# Patient Record
Sex: Male | Born: 1945 | Race: Black or African American | Hispanic: No | Marital: Married | Smoking: Never smoker
Health system: Southern US, Community
[De-identification: ages and names within clinical notes are randomized; demographics above are authoritative.]

## PROBLEM LIST (undated history)

## (undated) DIAGNOSIS — R011 Cardiac murmur, unspecified: Secondary | ICD-10-CM

## (undated) DIAGNOSIS — K219 Gastro-esophageal reflux disease without esophagitis: Secondary | ICD-10-CM

## (undated) DIAGNOSIS — J189 Pneumonia, unspecified organism: Secondary | ICD-10-CM

## (undated) DIAGNOSIS — Z923 Personal history of irradiation: Secondary | ICD-10-CM

## (undated) DIAGNOSIS — E785 Hyperlipidemia, unspecified: Secondary | ICD-10-CM

## (undated) DIAGNOSIS — C61 Malignant neoplasm of prostate: Secondary | ICD-10-CM

## (undated) DIAGNOSIS — M109 Gout, unspecified: Secondary | ICD-10-CM

## (undated) DIAGNOSIS — K4090 Unilateral inguinal hernia, without obstruction or gangrene, not specified as recurrent: Secondary | ICD-10-CM

## (undated) DIAGNOSIS — E119 Type 2 diabetes mellitus without complications: Secondary | ICD-10-CM

## (undated) DIAGNOSIS — I255 Ischemic cardiomyopathy: Secondary | ICD-10-CM

## (undated) DIAGNOSIS — M1990 Unspecified osteoarthritis, unspecified site: Secondary | ICD-10-CM

## (undated) DIAGNOSIS — I2510 Atherosclerotic heart disease of native coronary artery without angina pectoris: Secondary | ICD-10-CM

## (undated) DIAGNOSIS — IMO0002 Reserved for concepts with insufficient information to code with codable children: Secondary | ICD-10-CM

## (undated) DIAGNOSIS — T8859XA Other complications of anesthesia, initial encounter: Secondary | ICD-10-CM

## (undated) DIAGNOSIS — I10 Essential (primary) hypertension: Secondary | ICD-10-CM

## (undated) DIAGNOSIS — F32A Depression, unspecified: Secondary | ICD-10-CM

## (undated) DIAGNOSIS — F329 Major depressive disorder, single episode, unspecified: Secondary | ICD-10-CM

## (undated) DIAGNOSIS — R569 Unspecified convulsions: Secondary | ICD-10-CM

## (undated) DIAGNOSIS — F419 Anxiety disorder, unspecified: Secondary | ICD-10-CM

## (undated) HISTORY — DX: Personal history of irradiation: Z92.3

## (undated) HISTORY — DX: Unspecified osteoarthritis, unspecified site: M19.90

## (undated) HISTORY — DX: Ischemic cardiomyopathy: I25.5

## (undated) HISTORY — PX: INGUINAL HERNIA REPAIR: SUR1180

## (undated) HISTORY — DX: Unspecified convulsions: R56.9

## (undated) HISTORY — DX: Essential (primary) hypertension: I10

## (undated) HISTORY — DX: Atherosclerotic heart disease of native coronary artery without angina pectoris: I25.10

## (undated) HISTORY — PX: HAND SURGERY: SHX662

## (undated) HISTORY — DX: Unilateral inguinal hernia, without obstruction or gangrene, not specified as recurrent: K40.90

## (undated) HISTORY — DX: Cardiac murmur, unspecified: R01.1

## (undated) HISTORY — DX: Reserved for concepts with insufficient information to code with codable children: IMO0002

## (undated) HISTORY — DX: Type 2 diabetes mellitus without complications: E11.9

## (undated) HISTORY — DX: Hyperlipidemia, unspecified: E78.5

---

## 2000-01-28 ENCOUNTER — Ambulatory Visit (HOSPITAL_COMMUNITY): Admission: RE | Admit: 2000-01-28 | Discharge: 2000-01-28 | Payer: Self-pay | Admitting: Internal Medicine

## 2000-01-28 ENCOUNTER — Encounter: Payer: Self-pay | Admitting: Internal Medicine

## 2003-02-26 ENCOUNTER — Ambulatory Visit (HOSPITAL_COMMUNITY): Admission: RE | Admit: 2003-02-26 | Discharge: 2003-02-26 | Payer: Self-pay | Admitting: Internal Medicine

## 2003-02-26 ENCOUNTER — Encounter: Payer: Self-pay | Admitting: Internal Medicine

## 2003-10-31 ENCOUNTER — Ambulatory Visit (HOSPITAL_COMMUNITY): Admission: RE | Admit: 2003-10-31 | Discharge: 2003-10-31 | Payer: Self-pay | Admitting: Gastroenterology

## 2004-01-27 ENCOUNTER — Ambulatory Visit (HOSPITAL_COMMUNITY): Admission: RE | Admit: 2004-01-27 | Discharge: 2004-01-27 | Payer: Self-pay | Admitting: Internal Medicine

## 2004-03-12 ENCOUNTER — Ambulatory Visit (HOSPITAL_COMMUNITY): Admission: RE | Admit: 2004-03-12 | Discharge: 2004-03-12 | Payer: Self-pay

## 2004-03-18 ENCOUNTER — Encounter: Admission: RE | Admit: 2004-03-18 | Discharge: 2004-03-28 | Payer: Self-pay | Admitting: Neurology

## 2005-02-04 ENCOUNTER — Inpatient Hospital Stay (HOSPITAL_COMMUNITY): Admission: EM | Admit: 2005-02-04 | Discharge: 2005-02-06 | Payer: Self-pay | Admitting: Emergency Medicine

## 2005-02-04 ENCOUNTER — Ambulatory Visit: Payer: Self-pay | Admitting: Cardiology

## 2005-02-23 ENCOUNTER — Ambulatory Visit: Payer: Self-pay | Admitting: Internal Medicine

## 2005-04-01 ENCOUNTER — Ambulatory Visit: Payer: Self-pay | Admitting: Cardiology

## 2005-07-22 ENCOUNTER — Ambulatory Visit: Payer: Self-pay | Admitting: Cardiology

## 2006-01-07 ENCOUNTER — Ambulatory Visit: Payer: Self-pay | Admitting: Cardiology

## 2006-05-25 ENCOUNTER — Ambulatory Visit: Payer: Self-pay | Admitting: Cardiology

## 2006-11-10 ENCOUNTER — Ambulatory Visit: Payer: Self-pay | Admitting: Cardiology

## 2007-06-23 ENCOUNTER — Ambulatory Visit: Payer: Self-pay | Admitting: Cardiology

## 2007-09-20 ENCOUNTER — Ambulatory Visit: Payer: Self-pay

## 2007-09-20 LAB — CONVERTED CEMR LAB
Albumin: 3.9 g/dL (ref 3.5–5.2)
Alkaline Phosphatase: 53 units/L (ref 39–117)
Total Bilirubin: 0.8 mg/dL (ref 0.3–1.2)

## 2007-12-26 ENCOUNTER — Ambulatory Visit: Payer: Self-pay | Admitting: Cardiology

## 2007-12-28 ENCOUNTER — Ambulatory Visit: Payer: Self-pay | Admitting: Cardiology

## 2007-12-28 LAB — CONVERTED CEMR LAB
Cholesterol: 215 mg/dL (ref 0–200)
Direct LDL: 148.4 mg/dL
Total CHOL/HDL Ratio: 4.8
Triglycerides: 90 mg/dL (ref 0–149)
VLDL: 18 mg/dL (ref 0–40)

## 2008-03-01 ENCOUNTER — Ambulatory Visit: Payer: Self-pay | Admitting: Cardiology

## 2008-03-01 LAB — CONVERTED CEMR LAB
ALT: 20 units/L (ref 0–53)
Cholesterol: 171 mg/dL (ref 0–200)
HDL: 47.6 mg/dL (ref >39.0)
Total Protein: 6.7 g/dL (ref 6.0–8.3)
VLDL: 18 mg/dL (ref 0–40)

## 2008-09-03 ENCOUNTER — Ambulatory Visit: Payer: Self-pay | Admitting: Cardiovascular Disease

## 2008-09-05 ENCOUNTER — Ambulatory Visit: Payer: Self-pay

## 2008-09-05 ENCOUNTER — Ambulatory Visit: Payer: Self-pay | Admitting: Cardiovascular Disease

## 2008-09-05 LAB — CONVERTED CEMR LAB
ALT: 18 units/L (ref 0–53)
AST: 19 units/L (ref 0–37)
Albumin: 4 g/dL (ref 3.5–5.2)
BUN: 13 mg/dL (ref 6–23)
CO2: 30 meq/L (ref 19–32)
Chloride: 105 meq/L (ref 96–112)
Cholesterol: 149 mg/dL (ref 0–200)
Creatinine, Ser: 0.8 mg/dL (ref 0.4–1.5)
Total Bilirubin: 0.7 mg/dL (ref 0.3–1.2)
Total CK: 90 units/L (ref 7–195)
Triglycerides: 97 mg/dL (ref 0–149)

## 2009-02-15 ENCOUNTER — Telehealth: Payer: Self-pay | Admitting: Cardiovascular Disease

## 2009-03-18 ENCOUNTER — Ambulatory Visit: Payer: Self-pay | Admitting: Cardiovascular Disease

## 2009-03-18 DIAGNOSIS — E7800 Pure hypercholesterolemia, unspecified: Secondary | ICD-10-CM

## 2009-03-21 ENCOUNTER — Ambulatory Visit: Payer: Self-pay | Admitting: Cardiovascular Disease

## 2009-03-21 DIAGNOSIS — I2510 Atherosclerotic heart disease of native coronary artery without angina pectoris: Secondary | ICD-10-CM

## 2009-03-21 DIAGNOSIS — I10 Essential (primary) hypertension: Secondary | ICD-10-CM | POA: Insufficient documentation

## 2009-03-21 LAB — CONVERTED CEMR LAB
ALT: 15 units/L (ref 0–53)
Bilirubin, Direct: 0 mg/dL (ref 0.0–0.3)
Total Bilirubin: 0.6 mg/dL (ref 0.3–1.2)
Total CHOL/HDL Ratio: 3
VLDL: 10.4 mg/dL (ref 0.0–40.0)

## 2009-09-24 ENCOUNTER — Ambulatory Visit: Payer: Self-pay | Admitting: Cardiovascular Disease

## 2010-02-12 ENCOUNTER — Emergency Department (HOSPITAL_COMMUNITY): Admission: EM | Admit: 2010-02-12 | Discharge: 2010-02-12 | Payer: Self-pay | Admitting: Emergency Medicine

## 2010-09-30 NOTE — Assessment & Plan Note (Signed)
Summary: F6M/ANAS   Visit Type:  6 mo f/u Primary Provider:  Margaretmary Bayley, MD  CC:  pt had some weakness and sob last month along with joint pain..pt was on simvastatin and decided to d/c simvastatin and says sxms have gotten better and still has some hand pain but not like last month..denies any other cardiac complaints today.  History of Present Illness: Seth Guzman is a pleasant 65 year old African American male with past medical history significant for diabetes mellitus, hypertension, hyperlipidemia, and coronary artery disease, who is here today for routine cardiac follow up.   Seth Guzman most recent heart catheterization was in 2006 and showed branch vessel disease as well as moderate nonobstructive disease in the major epicardial vessels.  He was noted to have a 40-50% stenosis in the distal LAD as well as 20-30% lesions in the mid vessel. There was a large ramus intermedius branch that supplied much of the lateral wall and was free of significant disease.  The right coronary artery had minor luminal irregularities noted.  There was an 80% stenosis in the ostium of a bifurcating second diagonal branch.  There was a large RV marginal branch that had some disease.   He tells me that he has been doing well. He has had no chest pain. He is retired. He works around his house and yard and has had no problems. He has had no SOB, palpitations, dizziness, near syncope or syncope. He does get tired after exerting himself but this is unchanged. He had some left arm pain several months ago and thought it might be his Zocor so he stopped taking it. The pain was over the medial aspect of the left forearm and was a constant aching pain. It resolved after one week. He had no pain in the large muscles of the arms or legs.     Current Medications (verified): 1)  Actos 15 Mg Tabs (Pioglitazone Hcl) .Marland Kitchen.. 1 Tab Once Daily 2)  Glipizide-Metformin Hcl 5-500 Mg Tabs (Glipizide-Metformin Hcl) .Marland Kitchen.. 1 Tab Two Times A  Day 3)  Pantoprazole Sodium 40 Mg Tbec (Pantoprazole Sodium) .Marland Kitchen.. 1 Tab Once Daily 4)  Exforge 5-320 Mg Tabs (Amlodipine Besylate-Valsartan) .Marland Kitchen.. 1 Tab Once Daily 5)  Fish Oil 1000 Mg Caps (Omega-3 Fatty Acids) .Marland Kitchen.. 1 Cap Once Daily 6)  Glucosamine Sulfate 1000 Mg Tabs (Glucosamine Sulfate) .Marland Kitchen.. 1 Tab Once Daily 7)  Multivitamins   Tabs (Multiple Vitamin) .Marland Kitchen.. 1 Tab Once Daily 8)  Carvedilol 3.125 Mg Tabs (Carvedilol) .... Take One Tablet By Mouth Twice A Day 9)  Nitroglycerin 0.4 Mg Subl (Nitroglycerin) .... One Tablet Under Tongue Every 5 Minutes As Needed For Chest Pain---May Repeat Times Three  Allergies (verified): No Known Drug Allergies  Past History:  Past Medical History: Reviewed history from 09/18/2008 and no changes required. 1. Hypertension.   2. Hyperlipidemia.   3. Diabetes mellitus.   4. Coronary artery disease status post cath in 2006 that showed       moderate nonobstructive disease.  5. Erectile dysfunction 6. Osteoarthritis 7. GERD 8. Anxiety/Depression  Social History: Reviewed history from 09/18/2008 and no changes required. The patient denies use of tobacco, but does admit to   occasional alcohol use.  He denies use of illicit drugs.  He is retired   from [employer] .  He is married.      Review of Systems       The patient complains of fatigue.  The patient denies malaise, fever, weight gain/loss, vision loss,  decreased hearing, hoarseness, chest pain, palpitations, shortness of breath, prolonged cough, wheezing, sleep apnea, coughing up blood, abdominal pain, blood in stool, nausea, vomiting, diarrhea, heartburn, incontinence, blood in urine, muscle weakness, joint pain, leg swelling, rash, skin lesions, headache, fainting, dizziness, depression, anxiety, enlarged lymph nodes, easy bruising or bleeding, and environmental allergies.    Vital Signs:  Patient profile:   66 year old male Height:      71.5 inches Weight:      185 pounds BMI:      25.53 Pulse rate:   68 / minute Pulse rhythm:   regular BP sitting:   122 / 70  (left arm) Cuff size:   large  Vitals Entered By: Danielle Rankin, CMA (September 24, 2009 9:58 AM)  Physical Exam  General:  General: Well developed, well nourished, NAD HEENT: OP clear, mucus membranes moist Psychiatric: Mood and affect normal Neck: No JVD, no carotid bruits, no thyromegaly, no lymphadenopathy. Lungs:Clear bilaterally, no wheezes, rhonci, crackles CV: RRR no murmurs, gallops rubs Abdomen: soft, NT, ND, BS present Extremities: No edema, pulses 2+.    Impression & Recommendations:  Problem # 1:  CORONARY ATHEROSCLEROSIS, NATIVE VESSEL (ICD-414.01) Stable. Continue ASA, beta blocker. He will restart the Zocor at 40 mg by mouth at night. He is urged to call us if he has any joint or limb pain that he thinks may be related to this and we will advise.   His updated medication list for this problem includes:    Exforge 5-320 Mg Tabs (Amlodipine besylate-valsartan) .Marland Kitchen... 1 tab once daily    Carvedilol 3.125 Mg Tabs (Carvedilol) .Marland Kitchen... Take one tablet by mouth twice a day    Nitroglycerin 0.4 Mg Subl (Nitroglycerin) ..... One tablet under tongue every 5 minutes as needed for chest pain---may repeat times three    Aspirin 81 Mg Tbec (Aspirin) .Marland Kitchen... Take one tablet by mouth daily  Problem # 2:  HYPERTENSION, BENIGN (ICD-401.1) Well controlled.   His updated medication list for this problem includes:    Exforge 5-320 Mg Tabs (Amlodipine besylate-valsartan) .Marland Kitchen... 1 tab once daily    Carvedilol 3.125 Mg Tabs (Carvedilol) .Marland Kitchen... Take one tablet by mouth twice a day    Aspirin 81 Mg Tbec (Aspirin) .Marland Kitchen... Take one tablet by mouth daily  Problem # 3:  PURE HYPERCHOLESTEROLEMIA (ICD-272.0) Restart Zocor as above. LFTs have been normal in the past. Will need repeat lipids at follow up.   The following medications were removed from the medication list:    Simvastatin 40 Mg Tabs (Simvastatin) .Marland Kitchen... Take 1  tablet by mouth at bedtime His updated medication list for this problem includes:    Simvastatin 40 Mg Tabs (Simvastatin) .Marland Kitchen... Take one tablet by mouth daily at bedtime  Patient Instructions: 1)  Your physician recommends that you schedule a follow-up appointment in: 12 months 2)  Your physician has recommended you make the following change in your medication: Resume simvastatin 40 mg by mouth daily at bedtime

## 2010-11-16 LAB — URINALYSIS, ROUTINE W REFLEX MICROSCOPIC
Glucose, UA: NEGATIVE mg/dL
Protein, ur: 100 mg/dL — AB
pH: 6 (ref 5.0–8.0)

## 2010-11-16 LAB — URINE MICROSCOPIC-ADD ON

## 2010-11-16 LAB — PROTIME-INR: INR: 1.04 (ref 0.00–1.49)

## 2010-11-16 LAB — CBC
MCHC: 33.4 g/dL (ref 30.0–36.0)
RBC: 4.84 MIL/uL (ref 4.22–5.81)
WBC: 16.1 10*3/uL — ABNORMAL HIGH (ref 4.0–10.5)

## 2010-11-16 LAB — URINE CULTURE: Colony Count: >=100000

## 2010-11-16 LAB — DIFFERENTIAL
Basophils Absolute: 0 10*3/uL (ref 0.0–0.1)
Basophils Relative: 0 % (ref 0–1)
Lymphocytes Relative: 8 % — ABNORMAL LOW (ref 12–46)
Monocytes Relative: 6 % (ref 3–12)
Neutro Abs: 13.8 10*3/uL — ABNORMAL HIGH (ref 1.7–7.7)
Neutrophils Relative %: 85 % — ABNORMAL HIGH (ref 43–77)

## 2010-11-16 LAB — BASIC METABOLIC PANEL
CO2: 28 mEq/L (ref 19–32)
Calcium: 9.6 mg/dL (ref 8.4–10.5)
Creatinine, Ser: 0.89 mg/dL (ref 0.4–1.5)
GFR calc Af Amer: >60 mL/min (ref >60)
GFR calc non Af Amer: >60 mL/min (ref >60)

## 2011-01-01 ENCOUNTER — Ambulatory Visit (HOSPITAL_COMMUNITY)
Admission: RE | Admit: 2011-01-01 | Discharge: 2011-01-01 | Disposition: A | Discharge status: Home / Self Care | Payer: PRIVATE HEALTH INSURANCE | Source: Ambulatory Visit | Attending: Internal Medicine | Admitting: Internal Medicine

## 2011-01-01 ENCOUNTER — Other Ambulatory Visit: Payer: Self-pay | Admitting: Internal Medicine

## 2011-01-01 DIAGNOSIS — R109 Unspecified abdominal pain: Secondary | ICD-10-CM | POA: Insufficient documentation

## 2011-01-01 DIAGNOSIS — R197 Diarrhea, unspecified: Secondary | ICD-10-CM | POA: Insufficient documentation

## 2011-01-01 DIAGNOSIS — R509 Fever, unspecified: Secondary | ICD-10-CM

## 2011-01-01 DIAGNOSIS — R1010 Upper abdominal pain, unspecified: Secondary | ICD-10-CM

## 2011-01-13 NOTE — Assessment & Plan Note (Signed)
[facility]                            CARDIOLOGY OFFICE NOTE   NAME:Seth Guzman, Seth Guzman                          MRN:          [id number]  DATE:12/26/2007                            DOB:          Mar 07, 1946    PRIMARY CARE PHYSICIAN:  Margaretmary Bayley, M.D.   REASON FOR VISIT:  Cardiology follow-up.   HISTORY OF PRESENT ILLNESS:  Seth Guzman was seen back in October.  Overall, he has done fairly well without any progressive exertional  angina.  He does state that approximately 2 weeks ago he had some chest  pain that lasted almost all day, although since then he has had no  recurrence and his electrocardiogram today is actually normal.  He has  otherwise been compliant with his medications and he has been doing some  walking on a regular basis.  We did take him off of Lipitor around his  last visit as he was complaining of some pain in his arms.  Blood work  did not indicate any abnormalities in creatine kinase or liver function  tests.  He has stayed off of Statin therapy since then.   ALLERGIES:  No known drug allergies.  Intolerance to LIPITOR, however.   MEDICATIONS:  1. Actos 15 mg p.o. daily.  2. Aspirin 81 mg p.o. daily.  3. Multivitamin one p.o. daily.  4. Glipizide/metformin 5/500 mg p.o. b.i.d.  5. Exforge 5/320 mg p.o. daily.  6. Omega III supplements 1200 mg p.o. daily.  7. Protonix 40 mg p.o. daily.   REVIEW OF SYSTEMS:  As described in history of present illness,  otherwise negative.   PHYSICAL EXAMINATION:  VITAL SIGNS:  Blood pressure 122/79, heart rate  is 75, weight 189 pounds, down from 192.  GENERAL APPEARANCE:  The patient is comfortable, in no acute distress.  HEENT:  Conjunctivae and lids was normal.  Oropharynx is clear.  NECK:  Supple.  No elevated left ventricular pressure, no loud bruits,  no thyromegaly was noted.  LUNGS:  Clear without labored breathing.  CARDIAC:  Regular rate and rhythm.  No loud murmur or gallop.  ABDOMEN:  Soft, nontender.  Normal active bowel sounds.  EXTREMITIES:  No significant pitting edema and his pulses are 2+.  SKIN:  Warm and dry.  MUSCULOSKELETAL:  No kyphosis noted.  NEUROPSYCHIATRIC:  The patient alert x3.  Affect is appropriate.   IMPRESSION/RECOMMENDATIONS:  1. Branch vessel coronary artery disease with stable symptoms and      normal ejection fraction.  Plan will be to continue medical      therapy.  I have asked him to maintain a regular walking regimen.      His electrocardiogram is normal today.  I will see back in 6      months.  2. History of hyperlipidemia.  He has an intolerance to Lipitor due to      myalgias.  We will plan a follow-up fasting lipid profile and more      than likely the trial of simvastatin.     Jonelle Sidle, MD  Electronically Signed  SGM/MedQ  DD: 12/26/2007  DT: 12/26/2007  Job #: [id number]   cc:   Margaretmary Bayley, M.D.

## 2011-01-13 NOTE — Assessment & Plan Note (Signed)
[facility]                            CARDIOLOGY OFFICE NOTE   NAME:Guzman, Seth                          MRN:          [id number]  DATE:09/03/2008                            DOB:          11-Jul-1946    PRIMARY CARE PHYSICIAN:  Seth Bayley, MD   REASON FOR VISIT:  Routine cardiology followup.   HISTORY OF PRESENT ILLNESS:  Seth Guzman is a pleasant 65 year old African  American male with past medical history significant for diabetes  mellitus, hypertension, hyperlipidemia, and coronary artery disease, who  was recently followed in this office by Dr. Nona Dell, who is no  longer seeing patients in this clinic.  I am seeing Seth Guzman for the  first time today.  Seth Guzman most recent heart catheterization was in  2006 and showed branch vessel disease as well as moderate nonobstructive  disease in the major epicardial vessels.  He was noted to have a 40-50%  stenosis in the distal LAD as well as 20-30% lesions in the mid vessel.  There was a large ramus intermedius branch that supplied much of the  lateral wall and was free of significant disease.  The right coronary  artery had minor luminal irregularities noted.  There was an 80%  stenosis in the ostium of a bifurcating second diagonal branch.  There  was a large RV marginal branch that had some disease.   The patient was most recently seen in this office by Dr. Diona Browner in  April 2009.  Since that time, he has been doing well overall.  However,  he does describe increase in his overall level of fatigue and weakness.  He tells me that when he performs any activities, he has severe fatigue.  He really describes no dyspnea with exertion but has had several  episodes of chest and arm pain when doing things.  He denies any  palpitations, dizziness, near syncope, syncope, orthopnea, PND, or lower  extremity edema.   PAST MEDICAL HISTORY:  1. Hypertension.  2. Hyperlipidemia.  3. Diabetes  mellitus.  4. Coronary artery disease status post cath in 2006 that showed      moderate nonobstructive disease.   PAST SURGICAL HISTORY:  1. Hand surgery.  2. Hernia surgery.   ALLERGIES:  No known drug allergies.   CURRENT MEDICATIONS:  1. Actos 15 mg once daily.  2. Enteric-coated aspirin 81 mg once daily.  3. Multivitamin once daily.  4. Glipizide/metformin 5/500 mg twice daily.  5. Exforge 5/320 mg once daily.  6. Fish oil 1200 mg once daily.  7. Protonix 40 mg once daily.  8. Zocor 40 mg once daily.   SOCIAL HISTORY:  The patient denies use of tobacco, but does admit to  occasional alcohol use.  He denies use of illicit drugs.  He is retired  from [employer] .  He is married.   REVIEW OF SYSTEMS:  As stated in history of present illness is otherwise  negative.   PHYSICAL EXAMINATION:  VITAL SIGNS:  Blood pressure 124/72, pulse 92 and  regular, respirations 12 and unlabored.  GENERAL:  He is a pleasant, middle-aged [place] American male in no  acute distress.  He is alert and oriented x3.  PSYCHIATRIC:  Mood and affect are appropriate.  MUSCULOSKELETAL:  Muscle strength and tone is normal.  SKIN:  Warm and dry.  NECK:  No JVD.  No carotid bruits.  No thyromegaly.  No lymphadenopathy.  LUNGS:  Clear to auscultation bilaterally without wheezes, rhonchi, or  crackles noted.  CARDIOVASCULAR:  Regular rate and rhythm without murmurs, gallops, or  rubs noted.  ABDOMEN:  Soft, nontender, nondistended.  Bowel sounds are present.  EXTREMITIES:  No evidence of edema.  Pulses are 2+ in all extremities.   DIAGNOSTIC STUDIES:  Most recent fasting lipid profile is from March 01, 2008, that shows a total cholesterol 171, triglycerides 90, HDL  cholesterol of 48, LDL cholesterol of 105.   ASSESSMENT AND PLAN:  This is a pleasant 65 year old African American  male with diabetes mellitus, hypertension, hyperlipidemia, and known  moderate nonobstructive coronary artery  disease with most recent heart  cath in 2006, who presents for routine cardiac followup and has  complaints of fatigue with exertion as well as several episodes of chest  discomfort with radiation into the right arm.  The patient seems to have  good control of his risk factors; however, his symptoms are concerning.  I have discussed the possibility of progression of his coronary artery  disease with the patient.  I feel that we should proceed with an  exercise Myoview stress test.  The patient is agreeable with this plan.  We will call him with the results of his stress test, and if there are  any abnormalities, we will plan for a followup at that time.  Otherwise,  we will see him back in this office in 6 months.  I will not make any  changes to his current medication profile.     Verne Carrow, MD  Electronically Signed    CM/MedQ  DD: 09/03/2008  DT: 09/04/2008  Job #: [id number]

## 2011-01-13 NOTE — Assessment & Plan Note (Signed)
[facility]                            CARDIOLOGY OFFICE NOTE   NAME:Kirkpatrick, JAXTEN                          MRN:          [id number]  DATE:06/23/2007                            DOB:          06-27-1946    PRIMARY CARE PHYSICIAN:  Margaretmary Bayley, M.D.   REASON FOR VISIT:  Cardiac followup.   HISTORY OF PRESENT ILLNESS:  Mr. Chain reports doing well.  He is not  having any significant progressive angina, although at times he does  feel some angina when he exerts himself to moderate or a more  significant degree.  His electrocardiogram today shows a sinus rhythm at  81 beats per minute with no significant changes compared to his previous  tracing.  He states that he has lipids obtained approximately a month  ago and we will obtain these results for review.  Otherwise we went over  his medications.  He is not having any problems with significant dyspnea  on exertion, palpitations, or syncope.   ALLERGIES:  No known drug allergies.   PRESENT MEDICATIONS:  1. Actos 15 mg p.o. daily.  2. Aspirin 325 mg p.o. daily.  3. Multivitamin one p.o. daily.  4. Glipizide/Metformin 5/500 mg p.o. b.i.d.  5. Exforge 5/320 mg p.o. daily.  6. Omega-3 supplements 1200 mg p.o. daily.  7. Protonix 40 mg p.o. daily.   REVIEW OF SYSTEMS:  As described in the history of present illness.   PHYSICAL EXAMINATION:  VITAL SIGNS:  Blood pressure is 142/72, heart  rate is 81, weight is 192 pounds.  GENERAL:  The patient is normally nourished.  No acute distress.  NECK:  Supple.  No elevated jugular venous pressure.  No bruits.  LUNGS:  Clear without labored breathing at rest.  CARDIAC:  Reveals a regular rate and rhythm.  No loud murmur or gallop.  ABDOMEN:  Soft.  EXTREMITIES:  Show no pitting edema.   IMPRESSION/RECOMMENDATION:  Branch vessel coronary artery disease with  normal ejection fraction.  We will continue medical therapy.  I have  encouraged him to remain active.   He is not reporting any progressive  angina.  We could consider a long-acting nitrate, although he did not  particularly tolerate this well in the past.  I would like to see his  cholesterol numbers  as Statin therapy would generally be indicated for aggressive LDL  control of 70 or less.  Otherwise, I will see him back over the next 6  months.     Jonelle Sidle, MD  Electronically Signed    SGM/MedQ  DD: 06/23/2007  DT: 06/24/2007  Job #: [id number]   cc:   Margaretmary Bayley, M.D.

## 2011-01-16 NOTE — Assessment & Plan Note (Signed)
[facility]                            CARDIOLOGY OFFICE NOTE   NAME:Seth Guzman, Seth Guzman                          MRN:          [id number]  DATE:11/10/2006                            DOB:          [birth year]    PRIMARY CARE PHYSICIAN:  Dr. Margaretmary Bayley   REASON OF VISIT:  Followup coronary artery disease.   HISTORY OF PRESENT ILLNESS:  I saw Seth Guzman back in September.  He has a  history of branch vessel coronary artery disease with preserved ejection  fraction being managed medically.  He is not reporting any progressive  angina and continues on a fairly minimal medical regimen.  Relative  hypotension has limited use of beta blockers and angiotensive receptor  blockers previously.  I note that he is not on statin therapy, although  was in the past in the form of Caduet.  We talked about aggressive risk  factor modification and in general would recommend statin therapy in his  case if tolerated.  His electrocardiogram is stable today, showing sinus  rhythm with left atrial enlargement and rate of 71 beats per minute.   ALLERGIES:  No known drug allergies.   PRESENT MEDICATIONS:  1. Actos 15 mg p.o. daily.  2. Aspirin 81 mg p.o. daily.  3. Multivitamin 1 p.o. daily.  4. Glipizide/metformin 5/500 mg p.o. b.i.d.  5. Enforge 5/320 mg p.o. daily.  6. Fish oil tablets 1200 mg p.o. daily.  7. Protonix 40 mg p.o. daily.   EXAMINATION:  Blood pressure 122/78, heart rate is 71.  He weighs 187  pounds.  Patient is comfortable and in no acute distress.  NECK:  Reveals no elevated jugulous pressure, no bruits, no thyromegaly.  LUNGS:  Are clear without labored breathing.  CARDIAC:  Exam reveals a regular rate and rhythm without loud murmur or  gallop.  EXTREMITIES:  Show no significant pitting edema.   IMPRESSION/RECOMMENDATION:  1. Branch vessel coronary disease, observed ejection fraction.      Medical therapy will be continued.  I did ask that he check  with      Dr. Chestine Spore about his most recent lipid profile.  I would anticipate      that LDL should be under 100, and close to 70 with his active type      2 diabetes mellitus and coronary artery disease.  He was previously      on Lipitor which could certainly be reinstituted (was previously on      Caduet).  I will otherwise plan to see him back over the next      6 months.  2. Further plans to follow.     Jonelle Sidle, MD  Electronically Signed    SGM/MedQ  DD: 11/10/2006  DT: 11/12/2006  Job #: [id number]   cc:   Margaretmary Bayley, M.D.

## 2011-01-16 NOTE — H&P (Signed)
NAME:  TYRONE, PAUTSCH NO.:  [id number]   MEDICAL RECORD NO.:  [id number]          PATIENT TYPE:  INP   LOCATION:                               FACILITY:  [facility]   PHYSICIAN:  Jonelle Sidle, M.D. LHCDATE OF BIRTH:  29-Feb-1946   DATE OF ADMISSION:  02/04/2005  DATE OF DISCHARGE:                                HISTORY & PHYSICAL   PRIMARY CARE PHYSICIAN:  Margaretmary Bayley, M.D.   REASON FOR ADMISSION:  Chest pain, concerning for unstable angina.   HISTORY OF PRESENT ILLNESS:  Mr. Croft is a pleasant 65 year old male with a  longstanding history of type 2 diabetes mellitus, hyperlipidemia on Statin  therapy, hypertension, and no previously documented history of coronary  artery disease with negative exercise treadmill test for ischemia in 1994.  He does not remember his symptoms from that time but apparently underwent a  standard exercise treadmill test, as read by Dr. Lovell Sheehan, and the study  reported no evidence of ischemic heart disease based on electrocardiographic  changes.  Some premature ventricular complexes were noted but no sustained  arrhythmias were seen.  It was felt that the patient perhaps had diabetic  small vessel disease based on this and he has been managed medically since  then.  He is now referred to the office with a one month history of progressive  exertional shortness of breath, fatigue, and chest pain.  He states that he  was push mowing his yard and made two passes when he had to stop because of  shortness of breath and rapid heart rate which was very unusual.  Since that  time he has had similar symptoms with progressive fatigue and chest pain  described as a pressure.  He has also had a feeling of air in his chest  associated with fluttering.  Chest pressure has been progressive and has, in  fact, recurred again today at rest.  His electrocardiogram in the office, in  the absence of chest pain, shows sinus rhythm at 77 beats per minute  with no  ischemic ST-T wave changes.  I do not have an old tracing at hand.  He has  not had any interval ischemic cardiac testing.   ALLERGIES:  No reported drug allergies.   PRESENT MEDICATIONS:  1.  Actos 15 mg p.o. every day.  2.  Glipizide/Metformin 5/500 mg p.o. b.i.d.  3.  Avalide 300/12.5 mg p.o. every day.  4.  Aspirin 81 mg p.o. every day.  5.  Lipitor 20 mg p.o. every day (started in May).  6.  Multivitamin one p.o. every day.   PAST MEDICAL HISTORY:  1.  Longstanding history of type 2 diabetes mellitus.  2.  Hypertension.  3.  Hyperlipidemia.  4.  Reported gastroesophageal reflux disease.  5.  Reported erectile dysfunction.  6.  Osteoarthritis.  7.  History of anxiety and depression.   FAMILY HISTORY:  Significant for a brother at age 30 having a myocardial  infarction.   REVIEW OF SYSTEMS:  As described in the history of present illness.  He has  had  no prolonged palpitations or syncope.  He has had no obvious bleeding  problems.  Systems are otherwise negative.   SOCIAL HISTORY:  The patient is married and has four children.  He has no  significant tobacco use history.  He drinks alcohol occasionally, up to four  drinks a week.  He is employed by [employer] on light duty.   PHYSICAL EXAMINATION:  VITAL SIGNS:  Blood pressure, today, is 117/74, heart  rate is 78, weight is 198 pounds.  GENERAL:  This is a well nourished male seated in no acute distress.  HEENT:  Conjunctivae and lids normal.  Oropharynx is clear.  NECK:  Supple without elevated jugular venous pressure.  No carotid bruits.  No thyromegaly is noted.  LUNGS:  Clear to auscultation bilaterally.  CARDIAC:  Reveals a regular rate and rhythm without loud murmur, S3, gallop.  There is no pericardial rub.  ABDOMEN:  Soft and nontender with normoactive bowel sounds.  EXTREMITIES:  Show no pitting edema.  Peripheral pulses are 2+.  SKIN:  No ulcerative changes noted.  MUSCULOSKELETAL:  No  kyphosis noted.  NEURO/PSYCHIATRIC:  The patient is alert and oriented x 3.  Affect is  normal.   IMPRESSION:  1.  Symptoms concerning for unstable angina in a 65 year old male with      cardiac risk factors outlined above.  Electrocardiogram at rest, in the      absence of chest pain, is normal.  The symptoms have been progressive      over the last month.  2.  Longstanding history of type 2 diabetes mellitus.  3.  Hypertension.  4.  Hyperlipidemia on Statin therapy recently started in May.  I do not have      recent lipid profile information.  5.  The patient states that he is a Jehovah's Witness and does not want any      blood products.   PLAN:  I have reviewed the situation extensively with the patient and  discussed my concerns with him.  Our plan at this point will be to:  1.  Admit him to the hospital today for baseline blood work, chest x-ray,      and followup electrocardiogram.  2.  We will cycle cardiac markers as well.  3.  After discussing the inherent risks and potential benefits of diagnostic      cardiac catheterization, we will plan going forward with this tomorrow.      The patient agrees to proceed.  4.  Continue present outpatient medications, except to hold Metformin and      institute both heparin and      nitroglycerin.  5.  Follow up fasting lipid profile.  6.  Further plans to follow.       ___________________________________________  Jonelle Sidle, M.D. [facility]    SGM/MEDQ  D:  02/04/2005  T:  02/04/2005  Job:  [id number]   cc:   Margaretmary Bayley, M.D.  [address]  [place]  [place] [address]  Fax: [phone]

## 2011-01-16 NOTE — Assessment & Plan Note (Signed)
HEALTHCARE                              CARDIOLOGY OFFICE NOTE   NAME:Seth Guzman, Seth Guzman                          MRN:          [id number]  DATE:05/25/2006                            DOB:          1946/05/18    PRIMARY CARE PHYSICIAN:  Dr. Margaretmary Bayley.   REASON FOR VISIT:  Followup coronary artery disease.   HISTORY OF PRESENT ILLNESS:  I saw Seth Guzman back in May.  He has a history  of branch vessel coronary artery disease with preserved ejection fraction  that is being managed medically.  He is not reporting any progressive  angina, and electrocardiogram today shows sinus rhythm at 78 beats per  minute without acute ST-T wave changes in comparison to prior tracing from  May.  His medical regiment has been adjusted a fair bit since his last  visit.  This is now outlined below.  He tells me that he was having some  problems approximately 3 weeks ago with feeling of nausea and malaise.  He  tells me that he thought he had food poisoning, but the symptoms seemed to  last longer than he would expect.  This led to some of his medication  adjustments, and now he is experiencing these symptoms.  The blood pressure  is, however, higher today than typically.  He has also been having some  problems with back pain after changing a tire.  He was concerned that he may  have pulled a muscle or exacerbated his spinal disk disease.  He had some  mild tingling in his left arm, but this was nonexertional and possibly  neuropathic based on this.   ALLERGIES:  No known drug allergies.   PRESENT MEDICATIONS:  1. Actos 15 mg p.o. daily.  2. Aspirin 81 mg p.o. daily.  3. Multivitamin 1 p.o. daily.  4. Glipizide metformin 5/500 mg p.o. b.i.d.  5. Exforge 5/320 mg p.o. daily.   REVIEW OF SYSTEMS:  As described in history of present illness.   PHYSICAL EXAMINATION:  Blood pressure is 143/79, heart rate of 77, weight is  185 pounds.  The patient is comfortable and in  no acute distress.  Examination of the neck reveals no elevated jugular venous pressure or  bruits.  No thyromegaly is noted.  LUNGS:  Clear without labored breathing.  CARDIAC:  Exam reveals a regular rate and rhythm without loud murmur or  gallop, there is no pericardial rub.  ABDOMEN:  Soft with no bruit.  EXTREMITIES:  Show no pitting edema.   IMPRESSION AND RECOMMENDATIONS:  1. Branch vessel coronary artery disease with preserved ejection fraction.      Continue to suggest a strategy of medical therapy.  Blood pressure is      elevated today following some recent medication adjustments.  Seth Guzman      plans to see Seth Guzman back in the office for followup tomorrow.  In      general, would aim for more optimal blood pressure control, and      followup of lipid status.  LDL cholesterol should generally  be in the      70 to 80 range.  No further cardiac testing anticipated at this      particular time.  We will plan to see him back over the 6 months.  2. Hypertension, not optimally controlled, and discussed above.  3. Type 2 diabetes mellitus, followed by Seth Guzman.                                Jonelle Sidle, MD    SGM/MedQ  DD:  05/25/2006  DT:  05/27/2006  Job #:  [id number]   cc:   Margaretmary Bayley, M.D.

## 2011-01-16 NOTE — Discharge Summary (Signed)
NAME:  TRAMOND, SLINKER NO.:  [id number]   MEDICAL RECORD NO.:  [id number]          PATIENT TYPE:  INP   LOCATION:  3711                         FACILITY:  [facility]   PHYSICIAN:  Charlton Haws, M.D.     DATE OF BIRTH:  12-Mar-1946   DATE OF ADMISSION:  02/04/2005  DATE OF DISCHARGE:  02/06/2005                                 DISCHARGE SUMMARY   PRINCIPAL DIAGNOSIS:  Chest pain/unstable angina.   OTHER DIAGNOSES:  1.  Coronary artery disease.  2.  Type 2 diabetes mellitus.  3.  Hypertension.  4.  Hyperlipidemia.  5.  Gastroesophageal reflux disease.  6.  Erectile dysfunction.  7.  Osteoarthritis.  8.  Anxiety.  9.  Depression.   ALLERGIES:  NO KNOWN DRUG ALLERGIES.   PROCEDURES:  Left heart cardiac catheterization.   HISTORY OF PRESENT ILLNESS:  A 65 year old male with a prior history of  diabetes, hypertension, hyperlipidemia, and no previously documented history  of CAD who reported to the outpatient clinic to see Dr. Diona Browner on June 7  for complaints of a 1 week history of progressive exertional dyspnea,  fatigue, and chest discomfort. He has also had some fluttering/palpitations.  He felt that symptoms were progressive and occurred at rest the day of his  office visit. EKG in the office showed no STT changes. The decision was made  to admit him at that point for diagnostic catheterization.   HOSPITAL COURSE:  On June 8, the patient underwent left heart cardiac  catheterization revealing a normal left main, a 40 to 50% lesion in the  distal LAD with a 20% lesion in the mid-RD, an 80% stenosis in the second  diagonal, a normal left circumflex, and a 6% lesion in the OM1. The RCA had  a 20% lesion in the midsection of the artery while the RV branch had a 70%  proximal stenosis and a 50% lesion in the midsection. His EF was 55% with an  LVEDP of 11 mmHg. It was determined at that point that he would benefit most  from medical management. His sheath was  pulled and this morning he has been  ambulating in the hallways without recurrent discomfort or limitations. He  is being discharged home today in satisfactory condition.   DISCHARGE LABORATORY DATA:  Hemoglobin 13.5, hematocrit 40.5, WBC 8.4,  platelets 285. Sodium 137, potassium 3.8, chloride 102, CO2 28, BUN 11,  creatinine 0.9, glucose 159. Cardiac enzymes were negative x2. Total  cholesterol 166, triglycerides 102, HDL 53, LDL 93.   DISPOSITION:  The patient is being discharged home in good condition.   FOLLOW-UP PLANS/APPOINTMENTS:  He is asked to follow up with his primary  care physician, Dr. Chestine Spore, in 1 to 2 weeks and has an appointment with Dr.  Diona Browner on June 25 at 3:30 p.m.   DISCHARGE MEDICATIONS:  1.  Aspirin 81 mg once daily.  2.  Avalide 300/12.5 mg once daily.  3.  Lipitor 20 mg once daily.  4.  Multivitamin one once daily.  5.  Actos 30 mg once daily.  6.  Glipizide/metformin 5/500 mg b.i.d., to be resumed February 07, 2005.  7.  Nitroglycerin 0.4 mg sublingual p.r.n. chest pain.   PENDING LABORATORY DATA/STUDIES:  None.   DURATION OF DISCHARGE ENCOUNTER:  40 minutes.      [place]   CRB/MEDQ  D:  02/06/2005  T:  02/06/2005  Job:  [id number]

## 2011-01-16 NOTE — Op Note (Signed)
NAME:  GEO, SLONE NO.:  [id number]   MEDICAL RECORD NO.:  [id number]                   PATIENT TYPE:  AMB   LOCATION:  ENDO                                 FACILITY:  [facility]   PHYSICIAN:  Graylin Shiver, M.D.                DATE OF BIRTH:  September 16, 1945   DATE OF PROCEDURE:  10/31/2003  DATE OF DISCHARGE:                                 OPERATIVE REPORT   PROCEDURE:  Colonoscopy.   SURGEON:  Graylin Shiver, M.D.   INDICATIONS FOR PROCEDURE:  Screening.   PREMEDICATION:  Fentanyl 50 mcg IV, Versed 5 mg IV.   PROCEDURE IN DETAIL:  Informed consent was obtained after explanation of the  risks of bleeding, infection, perforation.  With the patient in the left  lateral decubitus position a rectal exam was performed.  No masses were  felt.  The Olympus colonoscope was inserted into the rectum and advanced  around the colon to the cecum.  The cecal landmarks were identified.  The  cecum and ascending colon were normal.  The transverse colon, descending  colon, sigmoid and rectum were normal.  He tolerated the procedure well  without complications.   IMPRESSION:  Normal colonoscopy to the cecum.   PLAN:  I would recommend a followup screening colonoscopy again in 10 years.                                               Graylin Shiver, M.D.    Germain Osgood  D:  10/31/2003  T:  10/31/2003  Job:  [id number]   cc:   Margaretmary Bayley, M.D.  [address], [address]  [place]  [place] [address]  Fax: [phone]

## 2011-01-16 NOTE — Cardiovascular Report (Signed)
NAME:  Seth Guzman, Seth Guzman NO.:  [id number]   MEDICAL RECORD NO.:  [id number]          PATIENT TYPE:  INP   LOCATION:  3711                         FACILITY:  [facility]   PHYSICIAN:  Seth Guzman, M.D. LHCDATE OF BIRTH:  August 21, 1946   DATE OF PROCEDURE:  DATE OF DISCHARGE:                              CARDIAC CATHETERIZATION   PRIMARY CARE PHYSICIAN:  Seth Guzman, M.D.   INDICATIONS:  Seth Guzman is a 65 year old male with a history of hypertension,  hyperlipidemia and type 2 diabetes mellitus. He was recently evaluated in  the office due to a history of progressive fatigue, dyspnea on exertion and  chest pain over the last month. He was subsequently admitted to the hospital  where he ruled out for myocardial infarction. He is now referred for  diagnostic coronary angiography to clearly assess the coronary anatomy and  evaluate for any potential revascularization strategies. Of note, he is a  TEFL teacher Witness and does not wish to have any type of blood products.   PROCEDURES PERFORMED:  1.  Left heart catheterization  2.  Selective coronary angiography.  3.  Left ventriculography.   ACCESS AND EQUIPMENT:  The area about the right femoral artery was  anesthetized with 1% lidocaine and a 6-French sheath was placed in the right  femoral artery via the modified Seldinger technique. Standard preformed 6-  [place] JL4 and JR4 catheters were used for selective coronary angiography  and angled pigtail catheter was used for left heart catheterization and left  ventriculography. All exchanges were made over a wire. The patient tolerated  the procedure well without immediate complications.   HEMODYNAMIC RESULTS:  Left ventricle 120/11 mmHg. Aorta 120/67 mmHg.   ANGIOGRAPHIC FINDINGS:  1.  The left main coronary artery is fairly short, but free of significant      flow-limiting coronary atherosclerosis.  2.  The left anterior descending is medium in caliber and has two  fairly      small mid diagonal branches, the second of which bifurcates. The left      anterior descending proper has approximately 20-30% mid vessel stenosis      that is fairly diffuse in nature predominantly around the origin of the      second diagonal branch. In the distal vessel there is a 40-50% stenosis      noted. No clear flow-limiting stenoses are observed. The second      bifurcating diagonal branch is small, however, does have approximately      80% diffuse disease in the ostium involving the bifurcation of the      vessel and lower sub branch.  3.  There is a very large ramus intermedius branch that portends much of the      lateral wall. There is a proximal branch that has diffuse 60% disease in      its proximal segment. The ramus intermedius has only minor luminal      irregularities.  4.  There is a small circumflex branch that is essentially a marginal. This      vessel has no significant flow-limiting  coronary sclerosis noted.  5.  The right coronary artery is dominant and is noted to have essentially a      single RV marginal branch that bifurcates proximally. There is 70%      stenosis in the proximal portion of the RV marginal branch followed by a      50% mid stenosis. The right coronary artery proper has minor luminal      irregularities with approximately 20% diffuse stenosis in the mid vessel      segment.   Left ventriculogram was performed in the RAO projection and revealed an  ejection fraction of approximately 55% with no significant mitral  regurgitation or focal wall motion abnormality.   DIAGNOSES:  1.  Coronary atherosclerosis as outlined above essentially involving branch      vessels, specifically a small second diagonal branch, sub branch of a      larger ramus intermedius and right ventricular marginal branch of the      right coronary artery. The major epicardial vessels are free of flow-      limiting stenoses, however.  2.  Left ventricular  ejection fraction of approximately 55% with no      significant mitral regurgitation and a left ventricular end-diastolic      pressure of 11 mmHg.   RECOMMENDATIONS:  I reviewed the films and situation with Dr. Riley Guzman. At  this point, the plan will be to optimize medical therapy as this branch  vessel disease could be angina provoking. There do not look to be any  obvious percutaneous revascularization strategies at this time.        SGM/MEDQ  D:  02/05/2005  T:  02/05/2005  Job:  [id number]   cc:   Seth Guzman, M.D.  [address], [address]  [place]  [place] [address]  Fax: [phone]

## 2011-04-24 ENCOUNTER — Other Ambulatory Visit: Payer: Self-pay | Admitting: *Deleted

## 2011-04-24 MED ORDER — CARVEDILOL 3.125 MG PO TABS: 3.1250 mg | ORAL_TABLET | Freq: Two times a day (BID) | ORAL | Status: DC

## 2011-09-03 ENCOUNTER — Ambulatory Visit (HOSPITAL_COMMUNITY)
Admission: RE | Admit: 2011-09-03 | Discharge: 2011-09-03 | Disposition: A | Discharge status: Home / Self Care | Payer: Medicare Other | Source: Ambulatory Visit | Attending: Internal Medicine | Admitting: Internal Medicine

## 2011-09-03 ENCOUNTER — Other Ambulatory Visit: Payer: Self-pay | Admitting: Internal Medicine

## 2011-09-03 DIAGNOSIS — E119 Type 2 diabetes mellitus without complications: Secondary | ICD-10-CM | POA: Insufficient documentation

## 2011-09-03 DIAGNOSIS — R110 Nausea: Secondary | ICD-10-CM

## 2011-09-03 DIAGNOSIS — R109 Unspecified abdominal pain: Secondary | ICD-10-CM | POA: Insufficient documentation

## 2012-10-21 ENCOUNTER — Ambulatory Visit (INDEPENDENT_AMBULATORY_CARE_PROVIDER_SITE_OTHER): Payer: Medicare Other | Admitting: General Surgery

## 2012-10-21 ENCOUNTER — Encounter (INDEPENDENT_AMBULATORY_CARE_PROVIDER_SITE_OTHER): Payer: Self-pay

## 2012-10-21 VITALS — BP 130/68 | HR 72 | Temp 98.3°F | Resp 18 | Ht 71.0 in | Wt 172.0 lb

## 2012-10-21 DIAGNOSIS — K4091 Unilateral inguinal hernia, without obstruction or gangrene, recurrent: Secondary | ICD-10-CM

## 2012-10-21 NOTE — Progress Notes (Signed)
Patient ID: Seth Guzman, male   DOB: 14-Jun-1946, 67 y.o.   MRN: [id number]  Chief Complaint  Patient presents with  . New Evaluation    eval hernia    HPI Seth Guzman is a 67 y.o. male.  HPI this patient is referred by Dr. Chestine Spore for evaluation of a recurrent left inguinal hernia. He says that he had a history of bilateral open inguinal hernia repairs in about 1978 and repair of recurrent RIH with mesh in 1991 by Dr. Lurene Shadow.  He now complains of a newly identified bulge in his left groin area. He says he first noticed this about one week ago. He says that with his prior hernia repair he thinks that they did apply some mesh on the right but not on the left. He also complains of some numbness and tingling of his leg mainly in his hip and buttock region that goes down to his leg but his strength is normal. He says his bowels are normal and denies any obstructive symptoms. He does have a history of some cardiac issues and some blockages with a cardiac catheterization last performed 5 years ago. He also is last saw his cardiologist about 8 months ago and is scheduled to see him again soon.  Past Medical History  Diagnosis Date  . HTN (hypertension)   . Inguinal hernia     recurrent right inguinal hernia  . Arthritis   . Hyperlipidemia   . Diabetes mellitus without complication   . Heart murmur   . Seizures   . 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 Hand surgery    . Inguinal hernia repair  1966, 1967    bilateral herniorrphoes    No family history on file.  Social History History  Substance Use Topics  . Smoking status: Never Smoker   . Smokeless tobacco: Never Used  . Alcohol Use: Yes    Allergies no known allergies  Current Outpatient Prescriptions  Medication Sig Dispense Refill  . amLODipine (NORVASC) 5 MG tablet Take 5 mg by mouth daily.      Marland Kitchen aspirin 81 MG tablet Take 81 mg by mouth daily.      Marland Kitchen atorvastatin (LIPITOR) 20  MG tablet Take 20 mg by mouth daily.      . fish oil-omega-3 fatty acids 1000 MG capsule Take 2 g by mouth daily.      Marland Kitchen glimepiride (AMARYL) 4 MG tablet Take 4 mg by mouth 2 (two) times daily.      . metFORMIN (GLUCOPHAGE) 1000 MG tablet Take 1,000 mg by mouth 2 (two) times daily with a meal.      . Multiple Vitamins-Minerals (MULTIVITAMIN WITH MINERALS) tablet Take 1 tablet by mouth daily.      . traMADol (ULTRAM) 50 MG tablet Take 50 mg by mouth every 6 (six) hours as needed for pain.      . carvedilol (COREG) 3.125 MG tablet Take 1 tablet (3.125 mg total) by mouth 2 (two) times daily.  60 tablet  6   No current facility-administered medications for this visit.    Review of Systems Review of Systems All other review of systems negative or noncontributory except as stated in the HPI  Blood pressure 130/68, pulse 72, temperature 98.3 F (36.8 C), resp. rate 18, height 5\' 11"  (1.803 m), weight 172 lb (78.019 kg).  Physical Exam Physical Exam Physical Exam  Vitals reviewed. Constitutional: He is oriented to  person, place, and time. He appears well-developed and well-nourished. No distress.  HENT:  Head: Normocephalic and atraumatic.  Mouth/Throat: No oropharyngeal exudate.  Eyes: Conjunctivae and EOM are normal. Pupils are equal, round, and reactive to light. Right eye exhibits no discharge. Left eye exhibits no discharge. No scleral icterus.  Neck: Normal range of motion. No tracheal deviation present.  Cardiovascular: Normal rate, regular rhythm and normal heart sounds.   Pulmonary/Chest: Effort normal and breath sounds normal. No stridor. No respiratory distress. He has no wheezes. He has no rales. He exhibits no tenderness.  Abdominal: Soft. Bowel sounds are normal. He exhibits no distension and no mass. There is no tenderness. There is no rebound and no guarding. No RIH with valsalva.  Small LIH on exam likely a small direct Musculoskeletal: Normal range of motion. He exhibits no  edema and no tenderness.  Neurological: He is alert and oriented to person, place, and time.  Skin: Skin is warm and dry. No rash noted. He is not diaphoretic. No erythema. No pallor.  Psychiatric: He has a normal mood and affect. His behavior is normal. Judgment and thought content normal.    Data Reviewed   Assessment    Recurrent Left inguinal hernia He appears to have a recurrent left in the hernia on exam. I do not appreciate any right inguinal hernia. This is reducible and I'm not sure that this is causing his symptoms although he does have a clear recurrence. I discussed with him the options for watchful waiting versus laparoscopic or open repair and I would probably favor laparoscopic repair given that this is a recurrent inguinal hernia. We did discuss all of these options and the risks and benefits of the surgery including the risks of infection, bleeding, pain, scarring, recurrence, nerve injury, and injury to the vas deferens or testicle or bowel and he expressed understanding. He explained his religious beliefs and refusing blood transfusions and I would honor this request. He explained that he would refuse blood transfusions even if it meant life-saving transfusion. He elected think about his options and he will call me back if he would like to schedule procedure     Plan    He will call me back if he would like to go ahead and schedule his procedure.        Lodema Pilot DAVID 10/21/2012, 10:37 AM

## 2013-06-13 ENCOUNTER — Encounter (HOSPITAL_COMMUNITY): Payer: Self-pay | Admitting: Emergency Medicine

## 2013-06-13 ENCOUNTER — Emergency Department (HOSPITAL_COMMUNITY): Payer: Medicare Other

## 2013-06-13 ENCOUNTER — Emergency Department (HOSPITAL_COMMUNITY)
Admission: EM | Admit: 2013-06-13 | Discharge: 2013-06-14 | Disposition: A | Discharge status: Home / Self Care | Payer: Medicare Other | Attending: Emergency Medicine | Admitting: Emergency Medicine

## 2013-06-13 DIAGNOSIS — Z792 Long term (current) use of antibiotics: Secondary | ICD-10-CM | POA: Insufficient documentation

## 2013-06-13 DIAGNOSIS — G40909 Epilepsy, unspecified, not intractable, without status epilepticus: Secondary | ICD-10-CM | POA: Insufficient documentation

## 2013-06-13 DIAGNOSIS — R011 Cardiac murmur, unspecified: Secondary | ICD-10-CM | POA: Insufficient documentation

## 2013-06-13 DIAGNOSIS — Z7982 Long term (current) use of aspirin: Secondary | ICD-10-CM | POA: Insufficient documentation

## 2013-06-13 DIAGNOSIS — Z8719 Personal history of other diseases of the digestive system: Secondary | ICD-10-CM | POA: Insufficient documentation

## 2013-06-13 DIAGNOSIS — M25519 Pain in unspecified shoulder: Secondary | ICD-10-CM | POA: Insufficient documentation

## 2013-06-13 DIAGNOSIS — E119 Type 2 diabetes mellitus without complications: Secondary | ICD-10-CM | POA: Insufficient documentation

## 2013-06-13 DIAGNOSIS — Z79899 Other long term (current) drug therapy: Secondary | ICD-10-CM | POA: Insufficient documentation

## 2013-06-13 DIAGNOSIS — Z791 Long term (current) use of non-steroidal anti-inflammatories (NSAID): Secondary | ICD-10-CM | POA: Insufficient documentation

## 2013-06-13 DIAGNOSIS — R0602 Shortness of breath: Secondary | ICD-10-CM | POA: Insufficient documentation

## 2013-06-13 DIAGNOSIS — M129 Arthropathy, unspecified: Secondary | ICD-10-CM | POA: Insufficient documentation

## 2013-06-13 DIAGNOSIS — M25511 Pain in right shoulder: Secondary | ICD-10-CM

## 2013-06-13 DIAGNOSIS — I10 Essential (primary) hypertension: Secondary | ICD-10-CM | POA: Insufficient documentation

## 2013-06-13 DIAGNOSIS — E785 Hyperlipidemia, unspecified: Secondary | ICD-10-CM | POA: Insufficient documentation

## 2013-06-13 LAB — COMPREHENSIVE METABOLIC PANEL
ALT: 13 U/L (ref 0–53)
AST: 17 U/L (ref 0–37)
Alkaline Phosphatase: 73 U/L (ref 39–117)
CO2: 24 mEq/L (ref 19–32)
Chloride: 101 mEq/L (ref 96–112)
GFR calc non Af Amer: >90 mL/min (ref >90)
Sodium: 136 mEq/L (ref 135–145)
Total Bilirubin: 0.3 mg/dL (ref 0.3–1.2)

## 2013-06-13 LAB — CBC WITH DIFFERENTIAL/PLATELET
Basophils Absolute: 0 10*3/uL (ref 0.0–0.1)
HCT: 41.8 % (ref 39.0–52.0)
Lymphocytes Relative: 32 % (ref 12–46)
Neutro Abs: 5.6 10*3/uL (ref 1.7–7.7)
Platelets: 273 10*3/uL (ref 150–400)
RBC: 5.18 MIL/uL (ref 4.22–5.81)
RDW: 13.7 % (ref 11.5–15.5)
WBC: 9.3 10*3/uL (ref 4.0–10.5)

## 2013-06-13 NOTE — ED Notes (Signed)
Patient returned from X-ray 

## 2013-06-13 NOTE — ED Notes (Signed)
EKG completed 1st shift, copy in label book under pt's last name

## 2013-06-13 NOTE — ED Provider Notes (Signed)
CSN: [id number]     Arrival date & time 06/13/13  1813 History  This chart was scribed for Marlon Pel, PA-C, working with Olivia Mackie, MD, by Ardelia Mems ED Scribe. This patient was seen in room WTR6/WTR6 and the patient's care was started at 11:21 PM.   Chief Complaint  Patient presents with  . Shoulder Pain  . Shortness of Breath    The history is provided by the patient. No language interpreter was used.    HPI Comments: Seth Guzman is a 67 y.o. male who presents to the Emergency Department complaining of intermittent, moderate right shoulder pain onset yesterday while at rest, that persists today. He reports associated mild SOB onset earlier today which he states has subsided. He states that his shoulder pain is worsened with leaning to the right. He states that he has no history of similar pain. He states that he has a history of degenerative disc disease and he feels that this might be related. He denies chest pain, abdominal pain, back pain, cough, sore throat, wheezing or any other symptoms.    Past Medical History  Diagnosis Date  . HTN (hypertension)   . Inguinal hernia     recurrent right inguinal hernia  . Arthritis   . Hyperlipidemia   . Diabetes mellitus without complication   . Heart murmur   . Seizures   . Meatal stenosis 1966, 1967   Past Surgical History  Procedure Laterality Date  . Hand surgery    . Inguinal hernia repair  1966, 1967    bilateral herniorrphoes   Family History  Problem Relation Age of Onset  . Diabetes Mother   . Diabetes Sister   . Diabetes Brother    History  Substance Use Topics  . Smoking status: Never Smoker   . Smokeless tobacco: Never Used  . Alcohol Use: Yes     Comment: ocasionally    Review of Systems  HENT: Negative for sore throat.   Respiratory: Positive for shortness of breath (subsided). Negative for cough and wheezing.   Cardiovascular: Negative for chest pain.  Gastrointestinal: Negative for abdominal  pain.  Musculoskeletal: Negative for back pain.       Right shoulder pain.  All other systems reviewed and are negative.   Allergies  Vicodin  Home Medications   Current Outpatient Rx  Name  Route  Sig  Dispense  Refill  . amLODipine (NORVASC) 5 MG tablet   Oral   Take 5 mg by mouth daily.         Marland Kitchen aspirin EC 81 MG tablet   Oral   Take 81 mg by mouth daily.         Marland Kitchen atorvastatin (LIPITOR) 20 MG tablet   Oral   Take 20 mg by mouth daily.         . carvedilol (COREG) 3.125 MG tablet   Oral   Take 3.125 mg by mouth 2 (two) times daily with a meal.         . celecoxib (CELEBREX) 200 MG capsule   Oral   Take 200 mg by mouth daily.         . fish oil-omega-3 fatty acids 1000 MG capsule   Oral   Take 2 g by mouth daily.         Marland Kitchen glimepiride (AMARYL) 4 MG tablet   Oral   Take 4 mg by mouth 2 (two) times daily.         Marland Kitchen  levofloxacin (LEVAQUIN) 500 MG tablet   Oral   Take 500 mg by mouth daily.         . metFORMIN (GLUCOPHAGE) 1000 MG tablet   Oral   Take 1,000 mg by mouth 2 (two) times daily with a meal.         . methocarbamol (ROBAXIN) 500 MG tablet   Oral   Take 500 mg by mouth 2 (two) times daily.         . Multiple Vitamins-Minerals (MULTIVITAMIN WITH MINERALS) tablet   Oral   Take 1 tablet by mouth daily.         Marland Kitchen oxyCODONE-acetaminophen (PERCOCET/ROXICET) 5-325 MG per tablet   Oral   Take 1 tablet by mouth every 6 (six) hours as needed for pain.   15 tablet   0   . traMADol (ULTRAM) 50 MG tablet   Oral   Take 50 mg by mouth every 6 (six) hours as needed for pain.          Triage Vitals: BP 178/79  Pulse 74  Temp(Src) 99.1 F (37.3 C) (Oral)  Resp 18  Ht 5' 11.5" (1.816 m)  Wt 172 lb (78.019 kg)  BMI 23.66 kg/m2  SpO2 99%  Physical Exam  Nursing note and vitals reviewed. Constitutional: He is oriented to person, place, and time. He appears well-developed and well-nourished. No distress.  HENT:  Head:  Normocephalic and atraumatic.  Eyes: EOM are normal.  Neck: Neck supple. No tracheal deviation present.  Cardiovascular: Normal rate.   Pulmonary/Chest: Effort normal. No respiratory distress.  Musculoskeletal:       Left shoulder: He exhibits decreased range of motion, tenderness, bony tenderness and pain. He exhibits no swelling, no effusion, no crepitus, no deformity, no laceration, no spasm, normal pulse and normal strength.  Neurological: He is alert and oriented to person, place, and time.  Skin: Skin is warm and dry.  Psychiatric: He has a normal mood and affect. His behavior is normal.    ED Course  Procedures (including critical care time)  DIAGNOSTIC STUDIES: Oxygen Saturation is 99% on RA, normal by my interpretation.    COORDINATION OF CARE: 11:28 PM- Pt advised of plan for treatment and pt agrees.  Labs Review Labs Reviewed  COMPREHENSIVE METABOLIC PANEL - Abnormal; Notable for the following:    Glucose, Bld 135 (*)    All other components within normal limits  CBC WITH DIFFERENTIAL  TROPONIN I   Imaging Review Dg Chest 2 View  06/13/2013   CLINICAL DATA:  Shortness of breath and right shoulder pain  EXAM: CHEST  2 VIEW  COMPARISON:  02/04/2005  FINDINGS: The heart size and mediastinal contours are within normal limits. Both lungs are clear. Incidental cervical ribs.  IMPRESSION: No active cardiopulmonary disease.   Electronically Signed   By: Tiburcio Pea M.D.   On: 06/13/2013 22:36   MDM   1. Shoulder joint pain, right     Patient with arthritis symptoms. SOB resolved after two hours. Troponin negative, blood work and vitals are reassuring, chest xray not acute. Discussed case with Dr. Norlene Campbell, patient can be discharged and to follow-up with PCP. Pt is being closely followed by cardiology and given strict return to ED precautions.  67 y.o.Joanne Brander Dufrane's evaluation in the Emergency Department is complete. It has been determined that no acute conditions  requiring further emergency intervention are present at this time. The patient/guardian have been advised of the diagnosis and plan. We have discussed  signs and symptoms that warrant return to the ED, such as changes or worsening in symptoms.  Vital signs are stable at discharge. Filed Vitals:   06/13/13 2041  BP: 178/79  Pulse: 74  Temp: 99.1 F (37.3 C)  Resp: 18    Patient/guardian has voiced understanding and agreed to follow-up with the PCP or specialist. I personally performed the services described in this documentation, which was scribed in my presence. The recorded information has been reviewed and is accurate.   Dorthula Matas, PA-C 06/14/13 [phone]

## 2013-06-13 NOTE — ED Notes (Signed)
Patient c/o right shoulder pain, nausea, and SOB. Patient reports that he saw his PCP and was told that he had degenerative disease of the right shoulder, but patient reports the id not have nausea, or SOB with the pain this AM.

## 2013-06-13 NOTE — ED Notes (Signed)
Blood drawn and given to [facility]

## 2013-06-13 NOTE — ED Notes (Signed)
Rechecked pt vitals.  Pt made aware of delay and all comfort measures addressed.

## 2013-06-14 LAB — TROPONIN I: Troponin I: <0.3 ng/mL (ref <0.30)

## 2013-06-14 MED ORDER — OXYCODONE-ACETAMINOPHEN 5-325 MG PO TABS: 1.0000 | ORAL_TABLET | Freq: Four times a day (QID) | ORAL | Status: DC | PRN

## 2013-06-14 MED ORDER — OXYCODONE-ACETAMINOPHEN 5-325 MG PO TABS
1.0000 | ORAL_TABLET | Freq: Once | ORAL | Status: AC
  Administered 2013-06-14: 1 via ORAL
  Filled 2013-06-14: qty 1

## 2013-06-14 NOTE — ED Provider Notes (Signed)
Medical screening examination/treatment/procedure(s) were performed by non-physician practitioner and as supervising physician I was immediately available for consultation/collaboration.  Olivia Mackie, MD 06/14/13 [phone]

## 2013-08-10 DIAGNOSIS — C61 Malignant neoplasm of prostate: Secondary | ICD-10-CM

## 2013-08-10 HISTORY — PX: PROSTATE BIOPSY: SHX241

## 2013-08-10 HISTORY — DX: Malignant neoplasm of prostate: C61

## 2013-08-21 ENCOUNTER — Other Ambulatory Visit: Payer: Self-pay | Admitting: Neurosurgery

## 2013-08-21 DIAGNOSIS — M5412 Radiculopathy, cervical region: Secondary | ICD-10-CM

## 2013-08-28 ENCOUNTER — Ambulatory Visit
Admission: RE | Admit: 2013-08-28 | Discharge: 2013-08-28 | Disposition: A | Discharge status: Home / Self Care | Payer: Medicare Other | Source: Ambulatory Visit | Attending: Neurosurgery | Admitting: Neurosurgery

## 2013-08-28 DIAGNOSIS — M5412 Radiculopathy, cervical region: Secondary | ICD-10-CM

## 2013-09-19 ENCOUNTER — Ambulatory Visit: Payer: PRIVATE HEALTH INSURANCE

## 2013-09-19 ENCOUNTER — Ambulatory Visit: Payer: PRIVATE HEALTH INSURANCE | Admitting: Radiation Oncology

## 2013-11-20 ENCOUNTER — Encounter: Payer: Self-pay | Admitting: Radiation Oncology

## 2013-11-20 DIAGNOSIS — C61 Malignant neoplasm of prostate: Secondary | ICD-10-CM | POA: Insufficient documentation

## 2013-11-20 NOTE — Progress Notes (Signed)
GU Location of Tumor / Histology: prostate  If Prostate Cancer, Gleason Score is (3 + 3) and PSA is (3.40 on 11/08/13) 10/2010 PSA  2.32 03/2010 PSA 3.0 01/2010 PSA 8.08, Tx w/Cipro for UTI  Patient presented 5 months ago with signs/symptoms of: urinary urgency, nocturia x 4, BPH  Biopsies of prostate (if applicable) revealed: 93/90/30 adenocarcinoma, 4/12 cores, Gleason 3+3=6, volume 28.67 cc  Past/Anticipated interventions by urology, if any: surveillance  Past/Anticipated interventions by medical oncology, if any: none  Weight changes, if any: no  Bowel/Bladder complaints, if any:  Nocturia x 2, IPSS 4  Nausea/Vomiting, if any: no  Pain issues, if any: no  SAFETY ISSUES:  Prior radiation? no  Pacemaker/ICD? no  Possible current pregnancy? na  Is the patient on methotrexate? no  Current Complaints / other details:  Married, retired from [employer] due to disability

## 2013-11-21 ENCOUNTER — Telehealth: Payer: Self-pay | Admitting: *Deleted

## 2013-11-21 ENCOUNTER — Ambulatory Visit
Admission: RE | Admit: 2013-11-21 | Discharge: 2013-11-21 | Disposition: A | Discharge status: Home / Self Care | Payer: Medicare Other | Source: Ambulatory Visit | Attending: Radiation Oncology | Admitting: Radiation Oncology

## 2013-11-21 ENCOUNTER — Encounter: Payer: Self-pay | Admitting: Radiation Oncology

## 2013-11-21 VITALS — BP 151/82 | HR 61 | Temp 98.4°F | Resp 20 | Ht 71.5 in | Wt 170.7 lb

## 2013-11-21 DIAGNOSIS — I10 Essential (primary) hypertension: Secondary | ICD-10-CM | POA: Insufficient documentation

## 2013-11-21 DIAGNOSIS — N529 Male erectile dysfunction, unspecified: Secondary | ICD-10-CM | POA: Insufficient documentation

## 2013-11-21 DIAGNOSIS — E119 Type 2 diabetes mellitus without complications: Secondary | ICD-10-CM | POA: Insufficient documentation

## 2013-11-21 DIAGNOSIS — E785 Hyperlipidemia, unspecified: Secondary | ICD-10-CM | POA: Insufficient documentation

## 2013-11-21 DIAGNOSIS — K219 Gastro-esophageal reflux disease without esophagitis: Secondary | ICD-10-CM | POA: Insufficient documentation

## 2013-11-21 DIAGNOSIS — C61 Malignant neoplasm of prostate: Secondary | ICD-10-CM

## 2013-11-21 DIAGNOSIS — Z7982 Long term (current) use of aspirin: Secondary | ICD-10-CM | POA: Insufficient documentation

## 2013-11-21 DIAGNOSIS — Z79899 Other long term (current) drug therapy: Secondary | ICD-10-CM | POA: Insufficient documentation

## 2013-11-21 HISTORY — DX: Gout, unspecified: M10.9

## 2013-11-21 HISTORY — DX: Anxiety disorder, unspecified: F41.9

## 2013-11-21 HISTORY — DX: Major depressive disorder, single episode, unspecified: F32.9

## 2013-11-21 HISTORY — DX: Malignant neoplasm of prostate: C61

## 2013-11-21 HISTORY — DX: Gastro-esophageal reflux disease without esophagitis: K21.9

## 2013-11-21 HISTORY — DX: Depression, unspecified: F32.A

## 2013-11-21 NOTE — Telephone Encounter (Signed)
Called patient to inform of gold seed appt. @ Dr. Janice Norrie' Office on 12-25-13- arrival time - 11:15 am, lvm for a return call

## 2013-11-21 NOTE — Progress Notes (Signed)
Please see the Nurse Progress Note in the MD Initial Consult Encounter for this patient. 

## 2013-11-21 NOTE — Progress Notes (Signed)
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  HISTORY OF PRESENT ILLNESS:  Seth Guzman is a 68 y.o. male who is seen today through the courtesy of Dr. Janice Norrie for discussion of possible radiation therapy in the management of his stage T1c versus T2a favorable risk adenocarcinoma prostate. His PSA in February 2012 is 2.32 and rose to 3.57 in November of 2014. He underwent ultrasound-guided biopsies by Dr. Janice Norrie on 08/10/2013. He was found have Gleason 6 (3+3) involving 10% of one core from the right mid gland, 5% of one core from the left base, 10% of one core from the left lateral apex and 10% of one core from the left apex. His gland volume was approximately 29 cc. He elected for close surveillance/observation, but now has second thoughts and wants to consider potentially curative treatment. His most recent PSA from 11/08/2013 is 3.4. He is doing well from a GU and GI standpoint. His I PSS score is 4. He does have erectile dysfunction. Of note is that he does have a past history of meatal stenosis with a split stream.  PREVIOUS RADIATION THERAPY: No   PAST MEDICAL HISTORY:  has a past medical history of HTN (hypertension); Inguinal hernia; Arthritis; Hyperlipidemia; Diabetes mellitus without complication; Heart murmur; Seizures; Meatal stenosis (1966, 1967); Prostate cancer (08/10/13); Anxiety; Gout; Depression; and GERD (gastroesophageal reflux disease).     PAST SURGICAL HISTORY:  Past Surgical History  Procedure Laterality Date  . Hand surgery    . Inguinal hernia repair  1966, 1967    bilateral herniorrphoes  . Prostate biopsy  08/10/13    gleason 3+3=6, vol 28.67 cc     FAMILY HISTORY: family history  includes Cancer in his father; Diabetes in his brother, mother, and sister; Heart attack in his sister; Stroke in his brother and mother. His mother died at age 24 following a stroke. His father died from larynx cancer at 37. No family history of prostate cancer.   SOCIAL HISTORY:  reports that he has never smoked. He has never used smokeless tobacco. He reports that he drinks alcohol. He reports that he does not use illicit drugs.  Married, 4 children, and 6 grandchildren. 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   ALLERGIES: Vicodin   MEDICATIONS:  Current Outpatient Prescriptions  Medication Sig Dispense Refill  . amLODipine (NORVASC) 5 MG tablet Take 5 mg by mouth daily.      Marland Kitchen aspirin EC 81 MG tablet Take 81 mg by mouth daily.      Marland Kitchen atorvastatin (LIPITOR) 20 MG tablet Take 20 mg by mouth daily.      . Canagliflozin (INVOKANA) 100 MG TABS Take by mouth.      . carvedilol (COREG) 3.125 MG tablet Take 3.125 mg by mouth 2 (two) times daily with a meal.      . fish oil-omega-3 fatty acids 1000 MG capsule Take 2 g by mouth daily.      Marland Kitchen glimepiride (AMARYL) 4 MG tablet Take 4 mg by mouth 2 (two) times daily.      . Multiple Vitamins-Minerals (MULTIVITAMIN WITH MINERALS) tablet Take 1 tablet by mouth daily.  No current facility-administered medications for this encounter.     REVIEW OF SYSTEMS:  Pertinent items are noted in HPI.    PHYSICAL EXAM:  height is 5' 11.5" (1.816 m) and weight is 170 lb 11.2 oz (77.429 kg). His temperature is 98.4 F (36.9 C). His blood pressure is 151/82 and his pulse is 61. His respiration is 20.   Alert and oriented. Head and neck examination: Grossly unremarkable. Chest: Lungs clear. Back: Without spinal or CVA tenderness. Heart: Regular in rhythm. Abdomen: Without masses organomegaly. Genitalia: Unremarkable to inspection. Rectal: The prostate gland is normal in size and there is a firm 3-4 mm nodule palpable along the mid to lower left  gland. There is no other induration or nodularity. Extremities: Without edema.   LABORATORY DATA:  Lab Results  Component Value Date   WBC 9.3 06/13/2013   HGB 14.2 06/13/2013   HCT 41.8 06/13/2013   MCV 80.7 06/13/2013   PLT 273 06/13/2013   Lab Results  Component Value Date   NA 136 06/13/2013   K 3.7 06/13/2013   CL 101 06/13/2013   CO2 24 06/13/2013   Lab Results  Component Value Date   ALT 13 06/13/2013   AST 17 06/13/2013   ALKPHOS 73 06/13/2013   BILITOT 0.3 06/13/2013   PSA 3.4 from 11/08/2013   IMPRESSION: Stage T1c versusT2a favorable risk adenocarcinoma prostate. I explained to the patient and his wife that his prognosis is related to his stage, PSA level, and Gleason score. All are favorable. Management options include surgery versus continued close surveillance/observation versus radiation therapy. Radiation therapy options include seed implantation alone or 8 weeks of external beam/IMRT. We discussed the potential acute and late toxicities of each radiation therapy option. He would be an excellent candidate for seed implantation, but he is concerned about radiation exposure to his grandchildren and great-grandchildren and also difficulty "being resuscitated" after general anesthesia. I spent a good deal of time discussing radiation safety issues and let him know that seed implantation a safe. On review of his anesthesia history, simply feel that he takes longer to arouse following general anesthesia but did not have any type of cardiac or respiratory arrest. After careful consideration he is most interested in external beam/IMRT. We will kindly asked Dr. Janice Norrie to place 3 gold seed markers for image guidance. After this is done he can return here for simulation/treatment planning. His prognosis is excellent.   PLAN: As discussed above.   I spent 60 minutes minutes face to face with the patient and more than 50% of that time was spent in counseling and/or coordination of  care.

## 2013-11-23 ENCOUNTER — Encounter: Payer: Self-pay | Admitting: *Deleted

## 2013-11-23 NOTE — Progress Notes (Signed)
[facility] Psychosocial Distress Screening Clinical Social Work  Clinical Social Work was referred by distress screening protocol.  The patient scored a 5 on the Psychosocial Distress Thermometer which indicates moderate distress. Clinical Social Worker phoned pt at home and left a vm to assess for distress and other psychosocial needs.    Clinical Social Worker follow up needed: yes  If yes, follow up plan: CSW awaits return call.  Loren Racer, LCSW Clinical Social Worker [facility] [place] Wednesday, Thursday and Friday Phone: [phone] Fax: [phone]

## 2013-11-27 ENCOUNTER — Telehealth: Payer: Self-pay | Admitting: *Deleted

## 2013-11-27 NOTE — Telephone Encounter (Signed)
CALLED PATIENT TO INFORM OF SIM ON 12-28-13 @ 9 AM, LVM FOR A RETURN CALL

## 2013-12-28 ENCOUNTER — Ambulatory Visit
Admission: RE | Admit: 2013-12-28 | Discharge: 2013-12-28 | Disposition: A | Discharge status: Home / Self Care | Payer: Medicare Other | Source: Ambulatory Visit | Attending: Radiation Oncology | Admitting: Radiation Oncology

## 2013-12-28 DIAGNOSIS — C61 Malignant neoplasm of prostate: Secondary | ICD-10-CM | POA: Insufficient documentation

## 2013-12-28 DIAGNOSIS — Z510 Encounter for antineoplastic radiation therapy: Secondary | ICD-10-CM | POA: Diagnosis present

## 2013-12-28 NOTE — Progress Notes (Signed)
Complex simulation/treatment planning note: The patient was taken to the CT simulator. A Vac lock immobilization device was constructed. A red rubber tube was placed within the rectal vault. He was then catheterized and contrast instilled into the bladder/urethra. His pelvis was scanned. I chose an isocenter in the center of the prostate. The CT data set was sent to [facility]  where I contoured his prostate, seminal vesicles, bladder, rectum, and inferior rectosigmoid colon. I am prescribing 7600 cGy in 38 sessions to his prostate PTV which represents the prostate +0.8 cm except for 0.5 cm along the rectum. I am prescribing 5700 cGy and 38 sessions to his seminal vesicle PTV this represents a seminal vesicles +0.5 cm. He is to be treated with a comfortably full bladder. I am requesting daily cone beam CT, setting up to his 3 gold seeds. He is now ready for IMRT simulation/treatment planning.

## 2014-01-01 ENCOUNTER — Encounter: Payer: Self-pay | Admitting: Radiation Oncology

## 2014-01-01 DIAGNOSIS — Z510 Encounter for antineoplastic radiation therapy: Secondary | ICD-10-CM | POA: Diagnosis not present

## 2014-01-01 NOTE — Progress Notes (Signed)
IMRT simulation/treatment planning note: Seth Guzman completed IMRT simulation/treatment planning in the management of his carcinoma the prostate. IMRT was chosen to decrease the risk for both acute and late bladder and rectal toxicity compared to conventional or 3-D conformal radiation therapy. Dose volume histograms were obtained for the target structures including the prostate PTV and also seminal vesicle PTV. We met our departmental goals. Dose volume histograms were also obtained for the avoidance structures including the bladder, rectum, and femoral heads. We met our departmental guidelines. I'm prescribing 7600 cGy in 38 sessions utilizing 6 MV photons, dual ARC VMAT IMRT. I prescribing 5700 cGy and 38 sessions to his seminal vesicles. He is be treated with a comfortably full bladder. I requesting daily cone beam CT setting up to his 3 gold seeds.

## 2014-01-03 DIAGNOSIS — Z510 Encounter for antineoplastic radiation therapy: Secondary | ICD-10-CM | POA: Diagnosis not present

## 2014-01-08 ENCOUNTER — Ambulatory Visit
Admission: RE | Admit: 2014-01-08 | Discharge: 2014-01-08 | Disposition: A | Discharge status: Home / Self Care | Payer: Medicare Other | Source: Ambulatory Visit | Attending: Radiation Oncology | Admitting: Radiation Oncology

## 2014-01-08 VITALS — BP 144/73 | HR 64 | Temp 98.3°F | Ht 71.5 in | Wt 171.8 lb

## 2014-01-08 DIAGNOSIS — C61 Malignant neoplasm of prostate: Secondary | ICD-10-CM

## 2014-01-08 DIAGNOSIS — Z510 Encounter for antineoplastic radiation therapy: Secondary | ICD-10-CM | POA: Diagnosis not present

## 2014-01-08 NOTE — Progress Notes (Signed)
Seth Guzman has had 1 fraction to his prostate.  He denies pain.  He reports urinary frequency from his medication - Invokana.  He was given the Radiation Therapy and You book and discussed side effects/management including diarrhea, fatigue, bladder changes and skin changes.  He was educated about under treat day on Monday's with Seth Guzman.   He was advised to contact nursing with any questions or concerns.

## 2014-01-08 NOTE — Progress Notes (Signed)
Chart note: The patient began his IMRT in the management of his carcinoma the prostate. He is being treated with 2 dynamic sets of multileaf collimators corresponding to one set of IMRT treatment devices [phone]).

## 2014-01-08 NOTE — Progress Notes (Signed)
Weekly Management Note:  Site: Prostate Current Dose:  200  cGy Projected Dose: 7600  cGy  Narrative: The patient is seen today for routine under treatment assessment. CBCT/MVCT images/port films were reviewed. The chart was reviewed.   Bladder filling is satisfactory. No GU or GI difficulties.  Physical Examination:  Filed Vitals:   01/08/14 1659  BP: 144/73  Pulse: 64  Temp: 98.3 F (36.8 C)  .  Weight: 171 lb 12.8 oz (77.928 kg). No change.  Impression: Tolerating radiation therapy well.  Plan: Continue radiation therapy as planned.

## 2014-01-09 ENCOUNTER — Ambulatory Visit
Admission: RE | Admit: 2014-01-09 | Discharge: 2014-01-09 | Disposition: A | Discharge status: Home / Self Care | Payer: Medicare Other | Source: Ambulatory Visit | Attending: Radiation Oncology | Admitting: Radiation Oncology

## 2014-01-09 DIAGNOSIS — Z510 Encounter for antineoplastic radiation therapy: Secondary | ICD-10-CM | POA: Diagnosis not present

## 2014-01-10 ENCOUNTER — Ambulatory Visit
Admission: RE | Admit: 2014-01-10 | Discharge: 2014-01-10 | Disposition: A | Discharge status: Home / Self Care | Payer: Medicare Other | Source: Ambulatory Visit | Attending: Radiation Oncology | Admitting: Radiation Oncology

## 2014-01-10 DIAGNOSIS — Z510 Encounter for antineoplastic radiation therapy: Secondary | ICD-10-CM | POA: Diagnosis not present

## 2014-01-11 ENCOUNTER — Ambulatory Visit
Admission: RE | Admit: 2014-01-11 | Discharge: 2014-01-11 | Disposition: A | Discharge status: Home / Self Care | Payer: Medicare Other | Source: Ambulatory Visit | Attending: Radiation Oncology | Admitting: Radiation Oncology

## 2014-01-11 DIAGNOSIS — Z510 Encounter for antineoplastic radiation therapy: Secondary | ICD-10-CM | POA: Diagnosis not present

## 2014-01-12 ENCOUNTER — Ambulatory Visit
Admission: RE | Admit: 2014-01-12 | Discharge: 2014-01-12 | Disposition: A | Discharge status: Home / Self Care | Payer: Medicare Other | Source: Ambulatory Visit | Attending: Radiation Oncology | Admitting: Radiation Oncology

## 2014-01-12 DIAGNOSIS — Z510 Encounter for antineoplastic radiation therapy: Secondary | ICD-10-CM | POA: Diagnosis not present

## 2014-01-15 ENCOUNTER — Ambulatory Visit
Admission: RE | Admit: 2014-01-15 | Discharge: 2014-01-15 | Disposition: A | Discharge status: Home / Self Care | Payer: Medicare Other | Source: Ambulatory Visit | Attending: Radiation Oncology | Admitting: Radiation Oncology

## 2014-01-15 VITALS — BP 142/76 | HR 66 | Temp 98.5°F | Wt 169.2 lb

## 2014-01-15 DIAGNOSIS — C61 Malignant neoplasm of prostate: Secondary | ICD-10-CM

## 2014-01-15 DIAGNOSIS — Z510 Encounter for antineoplastic radiation therapy: Secondary | ICD-10-CM | POA: Diagnosis not present

## 2014-01-15 NOTE — Progress Notes (Signed)
Weekly assessment of radiation to pelvis for prostate cancer.Completed 6 of 38 treatments.Urine flow not as strong and frequent.No bowel changes.

## 2014-01-15 NOTE — Progress Notes (Signed)
Weekly Management Note:  Site: Prostate Current Dose:  1200  cGy Projected Dose: 7600  cGy  Narrative: The patient is seen today for routine under treatment assessment. CBCT/MVCT images/port films were reviewed. The chart was reviewed.   Bladder filling is excellent. He does report some slowing of his urinary stream with nocturia x2-3 which is his baseline. His pretreatment I PSS score was only 4.  Physical Examination:  Filed Vitals:   01/15/14 1554  BP: 142/76  Pulse: 66  Temp: 98.5 F (36.9 C)  .  Weight: 169 lb 3.2 oz (76.749 kg). No change.  Impression: Tolerating radiation therapy well. I'm not sure if he is having obstructive symptoms this early in his course of radiation therapy. His bladder filling is exceptional, and I wonder if he is unable to empty his bladder. We may consider an alpha blocker.  Plan: Continue radiation therapy as planned.

## 2014-01-16 ENCOUNTER — Ambulatory Visit
Admission: RE | Admit: 2014-01-16 | Discharge: 2014-01-16 | Disposition: A | Discharge status: Home / Self Care | Payer: Medicare Other | Source: Ambulatory Visit | Attending: Radiation Oncology | Admitting: Radiation Oncology

## 2014-01-16 DIAGNOSIS — Z510 Encounter for antineoplastic radiation therapy: Secondary | ICD-10-CM | POA: Diagnosis not present

## 2014-01-17 ENCOUNTER — Ambulatory Visit
Admission: RE | Admit: 2014-01-17 | Discharge: 2014-01-17 | Disposition: A | Discharge status: Home / Self Care | Payer: Medicare Other | Source: Ambulatory Visit | Attending: Radiation Oncology | Admitting: Radiation Oncology

## 2014-01-17 DIAGNOSIS — Z510 Encounter for antineoplastic radiation therapy: Secondary | ICD-10-CM | POA: Diagnosis not present

## 2014-01-18 ENCOUNTER — Ambulatory Visit
Admission: RE | Admit: 2014-01-18 | Discharge: 2014-01-18 | Disposition: A | Discharge status: Home / Self Care | Payer: Medicare Other | Source: Ambulatory Visit | Attending: Radiation Oncology | Admitting: Radiation Oncology

## 2014-01-18 DIAGNOSIS — Z510 Encounter for antineoplastic radiation therapy: Secondary | ICD-10-CM | POA: Diagnosis not present

## 2014-01-19 ENCOUNTER — Ambulatory Visit
Admission: RE | Admit: 2014-01-19 | Discharge: 2014-01-19 | Disposition: A | Discharge status: Home / Self Care | Payer: Medicare Other | Source: Ambulatory Visit | Attending: Radiation Oncology | Admitting: Radiation Oncology

## 2014-01-19 DIAGNOSIS — Z510 Encounter for antineoplastic radiation therapy: Secondary | ICD-10-CM | POA: Diagnosis not present

## 2014-01-23 ENCOUNTER — Ambulatory Visit
Admission: RE | Admit: 2014-01-23 | Discharge: 2014-01-23 | Disposition: A | Discharge status: Home / Self Care | Payer: Medicare Other | Source: Ambulatory Visit | Attending: Radiation Oncology | Admitting: Radiation Oncology

## 2014-01-23 ENCOUNTER — Encounter: Payer: Self-pay | Admitting: Radiation Oncology

## 2014-01-23 VITALS — BP 131/66 | HR 66 | Temp 99.0°F | Resp 20 | Wt 169.7 lb

## 2014-01-23 DIAGNOSIS — Z510 Encounter for antineoplastic radiation therapy: Secondary | ICD-10-CM | POA: Diagnosis not present

## 2014-01-23 DIAGNOSIS — C61 Malignant neoplasm of prostate: Secondary | ICD-10-CM

## 2014-01-23 NOTE — Progress Notes (Signed)
Pt denies pain, fatigue, loss of appetite, bladder/bowel issues. He states that one day last week he experienced rectal pain unassociated w/bm that "lasted 2-3 minutes". He has had none since.

## 2014-01-23 NOTE — Progress Notes (Signed)
Weekly Management Note:  Site: Prostate Current Dose:  2200  cGy Projected Dose: 7600  cGy  Narrative: The patient is seen today for routine under treatment assessment. CBCT/MVCT images/port films were reviewed. The chart was reviewed.   Bladder filling is excellent. No new GU or GI difficulties. He does have some slowing of his stream towards the end of urination, but he completes urination within 30 seconds.  Physical Examination:  Filed Vitals:   01/23/14 1541  BP: 131/66  Pulse: 66  Temp: 99 F (37.2 C)  Resp: 20  .  Weight: 169 lb 11.2 oz (76.975 kg). No change.  Impression: Tolerating radiation therapy well.  Plan: Continue radiation therapy as planned.

## 2014-01-24 ENCOUNTER — Ambulatory Visit
Admission: RE | Admit: 2014-01-24 | Discharge: 2014-01-24 | Disposition: A | Discharge status: Home / Self Care | Payer: Medicare Other | Source: Ambulatory Visit | Attending: Radiation Oncology | Admitting: Radiation Oncology

## 2014-01-24 DIAGNOSIS — Z510 Encounter for antineoplastic radiation therapy: Secondary | ICD-10-CM | POA: Diagnosis not present

## 2014-01-25 ENCOUNTER — Ambulatory Visit
Admission: RE | Admit: 2014-01-25 | Discharge: 2014-01-25 | Disposition: A | Discharge status: Home / Self Care | Payer: Medicare Other | Source: Ambulatory Visit | Attending: Radiation Oncology | Admitting: Radiation Oncology

## 2014-01-25 DIAGNOSIS — Z510 Encounter for antineoplastic radiation therapy: Secondary | ICD-10-CM | POA: Diagnosis not present

## 2014-01-26 ENCOUNTER — Ambulatory Visit
Admission: RE | Admit: 2014-01-26 | Discharge: 2014-01-26 | Disposition: A | Discharge status: Home / Self Care | Payer: Medicare Other | Source: Ambulatory Visit | Attending: Radiation Oncology | Admitting: Radiation Oncology

## 2014-01-26 DIAGNOSIS — Z510 Encounter for antineoplastic radiation therapy: Secondary | ICD-10-CM | POA: Diagnosis not present

## 2014-01-29 ENCOUNTER — Ambulatory Visit
Admission: RE | Admit: 2014-01-29 | Discharge: 2014-01-29 | Disposition: A | Discharge status: Home / Self Care | Payer: Medicare Other | Source: Ambulatory Visit | Attending: Radiation Oncology | Admitting: Radiation Oncology

## 2014-01-29 ENCOUNTER — Encounter: Payer: Self-pay | Admitting: Radiation Oncology

## 2014-01-29 VITALS — BP 124/66 | HR 65 | Temp 98.6°F | Resp 20 | Wt 170.5 lb

## 2014-01-29 DIAGNOSIS — Z510 Encounter for antineoplastic radiation therapy: Secondary | ICD-10-CM | POA: Diagnosis not present

## 2014-01-29 DIAGNOSIS — C61 Malignant neoplasm of prostate: Secondary | ICD-10-CM

## 2014-01-29 NOTE — Progress Notes (Signed)
Weekly Management Note:  Site: Prostate Current Dose:  3000  cGy Projected Dose: 7600  cGy  Narrative: The patient is seen today for routine under treatment assessment. CBCT/MVCT images/port films were reviewed. The chart was reviewed.   Bladder filling is suboptimal today. This was no surprise to the patient. No GU or GI difficulty.  Physical Examination:  Filed Vitals:   01/29/14 1547  BP: 124/66  Pulse: 65  Temp: 98.6 F (37 C)  Resp: 20  .  Weight: 170 lb 8 oz (77.338 kg). No change.  Impression: Tolerating radiation therapy well. He will improve his bladder filling.  Plan: Continue radiation therapy as planned.

## 2014-01-29 NOTE — Progress Notes (Signed)
Pt denies pain, new urinary issues, bowel issues, loss of appetite. He is noticing some fatigue.

## 2014-01-30 ENCOUNTER — Ambulatory Visit
Admission: RE | Admit: 2014-01-30 | Discharge: 2014-01-30 | Disposition: A | Discharge status: Home / Self Care | Payer: Medicare Other | Source: Ambulatory Visit | Attending: Radiation Oncology | Admitting: Radiation Oncology

## 2014-01-30 DIAGNOSIS — Z510 Encounter for antineoplastic radiation therapy: Secondary | ICD-10-CM | POA: Diagnosis not present

## 2014-01-31 ENCOUNTER — Ambulatory Visit
Admission: RE | Admit: 2014-01-31 | Discharge: 2014-01-31 | Disposition: A | Discharge status: Home / Self Care | Payer: Medicare Other | Source: Ambulatory Visit | Attending: Radiation Oncology | Admitting: Radiation Oncology

## 2014-01-31 DIAGNOSIS — Z510 Encounter for antineoplastic radiation therapy: Secondary | ICD-10-CM | POA: Diagnosis not present

## 2014-02-01 ENCOUNTER — Ambulatory Visit
Admission: RE | Admit: 2014-02-01 | Discharge: 2014-02-01 | Disposition: A | Discharge status: Home / Self Care | Payer: Medicare Other | Source: Ambulatory Visit | Attending: Radiation Oncology | Admitting: Radiation Oncology

## 2014-02-01 DIAGNOSIS — Z510 Encounter for antineoplastic radiation therapy: Secondary | ICD-10-CM | POA: Diagnosis not present

## 2014-02-02 ENCOUNTER — Ambulatory Visit
Admission: RE | Admit: 2014-02-02 | Discharge: 2014-02-02 | Disposition: A | Discharge status: Home / Self Care | Payer: Medicare Other | Source: Ambulatory Visit | Attending: Radiation Oncology | Admitting: Radiation Oncology

## 2014-02-02 DIAGNOSIS — Z510 Encounter for antineoplastic radiation therapy: Secondary | ICD-10-CM | POA: Diagnosis not present

## 2014-02-05 ENCOUNTER — Ambulatory Visit
Admission: RE | Admit: 2014-02-05 | Discharge: 2014-02-05 | Disposition: A | Discharge status: Home / Self Care | Payer: Medicare Other | Source: Ambulatory Visit | Attending: Radiation Oncology | Admitting: Radiation Oncology

## 2014-02-05 DIAGNOSIS — Z510 Encounter for antineoplastic radiation therapy: Secondary | ICD-10-CM | POA: Diagnosis not present

## 2014-02-05 NOTE — Progress Notes (Signed)
Pt reports some fatigue yesterday after doing yard work yesterday. He states "I have slowed down some.". Pt denies pain, loss of appetite, bowel issues. He states last night he had burning after he stopped urinating. He states his stream is "spraying instead of a steady stream", also states his stream is slower. He denies weak stream or straining to void.

## 2014-02-06 ENCOUNTER — Ambulatory Visit: Admission: RE | Admit: 2014-02-06 | Payer: Medicare Other | Source: Ambulatory Visit | Admitting: Radiation Oncology

## 2014-02-06 ENCOUNTER — Ambulatory Visit
Admission: RE | Admit: 2014-02-06 | Discharge: 2014-02-06 | Disposition: A | Discharge status: Home / Self Care | Payer: Medicare Other | Source: Ambulatory Visit | Attending: Radiation Oncology | Admitting: Radiation Oncology

## 2014-02-06 ENCOUNTER — Ambulatory Visit: Payer: Medicare Other | Admitting: Radiation Oncology

## 2014-02-06 DIAGNOSIS — Z510 Encounter for antineoplastic radiation therapy: Secondary | ICD-10-CM | POA: Diagnosis not present

## 2014-02-06 DIAGNOSIS — C61 Malignant neoplasm of prostate: Secondary | ICD-10-CM

## 2014-02-06 NOTE — Progress Notes (Signed)
Pt to see Dr Sondra Come today, was missed for weekly put visit yesterday.

## 2014-02-06 NOTE — Progress Notes (Signed)
  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   Current Dose: 42 Gy     Planned Dose:  78 Gy  Narrative . . . . . . . . The patient presents for routine under treatment assessment.                                   The patient is without complaint except for some mild dysuria. He is also noticed some mild fatigue.                                 Set-up films were reviewed.                                 The chart was checked. Physical Findings. . . . The lungs are clear. The heart has a regular rhythm and rate. The abdomen is soft and nontender with normal bowel sounds. Impression . . . . . . . The patient is tolerating radiation. Plan . . . . . . . . . . . . Continue treatment as planned.  ________________________________   Blair Promise, PhD, MD

## 2014-02-07 ENCOUNTER — Ambulatory Visit
Admission: RE | Admit: 2014-02-07 | Discharge: 2014-02-07 | Disposition: A | Discharge status: Home / Self Care | Payer: Medicare Other | Source: Ambulatory Visit | Attending: Radiation Oncology | Admitting: Radiation Oncology

## 2014-02-07 DIAGNOSIS — Z510 Encounter for antineoplastic radiation therapy: Secondary | ICD-10-CM | POA: Diagnosis not present

## 2014-02-08 ENCOUNTER — Ambulatory Visit
Admission: RE | Admit: 2014-02-08 | Discharge: 2014-02-08 | Disposition: A | Discharge status: Home / Self Care | Payer: Medicare Other | Source: Ambulatory Visit | Attending: Radiation Oncology | Admitting: Radiation Oncology

## 2014-02-08 DIAGNOSIS — Z510 Encounter for antineoplastic radiation therapy: Secondary | ICD-10-CM | POA: Diagnosis not present

## 2014-02-09 ENCOUNTER — Ambulatory Visit
Admission: RE | Admit: 2014-02-09 | Discharge: 2014-02-09 | Disposition: A | Discharge status: Home / Self Care | Payer: Medicare Other | Source: Ambulatory Visit | Attending: Radiation Oncology | Admitting: Radiation Oncology

## 2014-02-09 DIAGNOSIS — Z510 Encounter for antineoplastic radiation therapy: Secondary | ICD-10-CM | POA: Diagnosis not present

## 2014-02-12 ENCOUNTER — Ambulatory Visit
Admission: RE | Admit: 2014-02-12 | Discharge: 2014-02-12 | Disposition: A | Discharge status: Home / Self Care | Payer: Medicare Other | Source: Ambulatory Visit | Attending: Radiation Oncology | Admitting: Radiation Oncology

## 2014-02-12 ENCOUNTER — Encounter: Payer: Self-pay | Admitting: Radiation Oncology

## 2014-02-12 VITALS — BP 130/76 | HR 66 | Temp 98.2°F | Resp 20 | Wt 170.3 lb

## 2014-02-12 DIAGNOSIS — C61 Malignant neoplasm of prostate: Secondary | ICD-10-CM

## 2014-02-12 DIAGNOSIS — Z510 Encounter for antineoplastic radiation therapy: Secondary | ICD-10-CM | POA: Diagnosis not present

## 2014-02-12 NOTE — Progress Notes (Signed)
Pt denies pain, loss of appetite, urinary issues. He states that he hasn't had dysuria recently. Pt has occasional loose stools, but never several in a day.

## 2014-02-12 NOTE — Progress Notes (Signed)
Weekly Management Note:  Site: Prostate Current Dose:  5000  cGy Projected Dose: 7600  cGy  Narrative: The patient is seen today for routine under treatment assessment. CBCT/MVCT images/port films were reviewed. The chart was reviewed.   Bladder filling is excellent. No new GU or GI difficulty.  Physical Examination:  Filed Vitals:   02/12/14 1527  BP: 130/76  Pulse: 66  Temp: 98.2 F (36.8 C)  Resp: 20  .  Weight: 170 lb 4.8 oz (77.248 kg). No change.  Impression: Tolerating radiation therapy well.  Plan: Continue radiation therapy as planned.

## 2014-02-13 ENCOUNTER — Ambulatory Visit
Admission: RE | Admit: 2014-02-13 | Discharge: 2014-02-13 | Disposition: A | Discharge status: Home / Self Care | Payer: Medicare Other | Source: Ambulatory Visit | Attending: Radiation Oncology | Admitting: Radiation Oncology

## 2014-02-13 DIAGNOSIS — Z510 Encounter for antineoplastic radiation therapy: Secondary | ICD-10-CM | POA: Diagnosis not present

## 2014-02-14 ENCOUNTER — Ambulatory Visit
Admission: RE | Admit: 2014-02-14 | Discharge: 2014-02-14 | Disposition: A | Discharge status: Home / Self Care | Payer: Medicare Other | Source: Ambulatory Visit | Attending: Radiation Oncology | Admitting: Radiation Oncology

## 2014-02-14 DIAGNOSIS — Z510 Encounter for antineoplastic radiation therapy: Secondary | ICD-10-CM | POA: Diagnosis not present

## 2014-02-15 ENCOUNTER — Ambulatory Visit
Admission: RE | Admit: 2014-02-15 | Discharge: 2014-02-15 | Disposition: A | Discharge status: Home / Self Care | Payer: Medicare Other | Source: Ambulatory Visit | Attending: Radiation Oncology | Admitting: Radiation Oncology

## 2014-02-15 DIAGNOSIS — Z510 Encounter for antineoplastic radiation therapy: Secondary | ICD-10-CM | POA: Diagnosis not present

## 2014-02-16 ENCOUNTER — Ambulatory Visit
Admission: RE | Admit: 2014-02-16 | Discharge: 2014-02-16 | Disposition: A | Discharge status: Home / Self Care | Payer: Medicare Other | Source: Ambulatory Visit | Attending: Radiation Oncology | Admitting: Radiation Oncology

## 2014-02-16 DIAGNOSIS — Z510 Encounter for antineoplastic radiation therapy: Secondary | ICD-10-CM | POA: Diagnosis not present

## 2014-02-19 ENCOUNTER — Ambulatory Visit
Admission: RE | Admit: 2014-02-19 | Discharge: 2014-02-19 | Disposition: A | Discharge status: Home / Self Care | Payer: Medicare Other | Source: Ambulatory Visit | Attending: Radiation Oncology | Admitting: Radiation Oncology

## 2014-02-19 ENCOUNTER — Encounter: Payer: Self-pay | Admitting: Radiation Oncology

## 2014-02-19 VITALS — BP 143/77 | HR 65 | Temp 98.5°F | Resp 20 | Wt 174.8 lb

## 2014-02-19 DIAGNOSIS — Z510 Encounter for antineoplastic radiation therapy: Secondary | ICD-10-CM | POA: Diagnosis not present

## 2014-02-19 DIAGNOSIS — C61 Malignant neoplasm of prostate: Secondary | ICD-10-CM

## 2014-02-19 MED ORDER — TAMSULOSIN HCL 0.4 MG PO CAPS: 0.4000 mg | ORAL_CAPSULE | Freq: Every day | ORAL | Status: DC

## 2014-02-19 NOTE — Progress Notes (Signed)
Weekly Management Note:  Site: Prostate Current Dose: 6000  cGy Projected Dose: 7600  cGy  Narrative: The patient is seen today for routine under treatment assessment. CBCT/MVCT images/port films were reviewed. The chart was reviewed.   Bladder filling is excellent. No GU or GI difficulties except for worsening nocturia x5-6 the, urinary hesitancy and slowing of his stream.   Physical Examination:  Filed Vitals:   02/19/14 1402  BP: 143/77  Pulse: 65  Temp: 98.5 F (36.9 C)  Resp: 20  .  Weight: 174 lb 12.8 oz (79.289 kg). No change.  Impression: Tolerating radiation therapy well, except for what I believe to be worsening obstructive symptomatology. I'll start him on tamsulosin.  Plan: Continue radiation therapy as planned.

## 2014-02-19 NOTE — Progress Notes (Signed)
Patient denies pain, bowel issues, loss of appetite. He is having "more and more fatigue", states last night he had nocturia x 5-6. His usual nocturia is 3-4 x nightly. He states his stream was "more of a spray with dribbling". Pt denies dysuria.

## 2014-02-20 ENCOUNTER — Ambulatory Visit
Admission: RE | Admit: 2014-02-20 | Discharge: 2014-02-20 | Disposition: A | Discharge status: Home / Self Care | Payer: Medicare Other | Source: Ambulatory Visit | Attending: Radiation Oncology | Admitting: Radiation Oncology

## 2014-02-20 DIAGNOSIS — Z510 Encounter for antineoplastic radiation therapy: Secondary | ICD-10-CM | POA: Diagnosis not present

## 2014-02-21 ENCOUNTER — Ambulatory Visit
Admission: RE | Admit: 2014-02-21 | Discharge: 2014-02-21 | Disposition: A | Discharge status: Home / Self Care | Payer: Medicare Other | Source: Ambulatory Visit | Attending: Radiation Oncology | Admitting: Radiation Oncology

## 2014-02-21 DIAGNOSIS — Z510 Encounter for antineoplastic radiation therapy: Secondary | ICD-10-CM | POA: Diagnosis not present

## 2014-02-22 ENCOUNTER — Ambulatory Visit
Admission: RE | Admit: 2014-02-22 | Discharge: 2014-02-22 | Disposition: A | Discharge status: Home / Self Care | Payer: Medicare Other | Source: Ambulatory Visit | Attending: Radiation Oncology | Admitting: Radiation Oncology

## 2014-02-22 DIAGNOSIS — Z510 Encounter for antineoplastic radiation therapy: Secondary | ICD-10-CM | POA: Diagnosis not present

## 2014-02-23 ENCOUNTER — Ambulatory Visit
Admission: RE | Admit: 2014-02-23 | Discharge: 2014-02-23 | Disposition: A | Discharge status: Home / Self Care | Payer: Medicare Other | Source: Ambulatory Visit | Attending: Radiation Oncology | Admitting: Radiation Oncology

## 2014-02-23 DIAGNOSIS — Z510 Encounter for antineoplastic radiation therapy: Secondary | ICD-10-CM | POA: Diagnosis not present

## 2014-02-26 ENCOUNTER — Ambulatory Visit
Admission: RE | Admit: 2014-02-26 | Discharge: 2014-02-26 | Disposition: A | Discharge status: Home / Self Care | Payer: Medicare Other | Source: Ambulatory Visit | Attending: Radiation Oncology | Admitting: Radiation Oncology

## 2014-02-26 VITALS — BP 142/66 | HR 71 | Temp 98.0°F | Resp 20 | Wt 176.4 lb

## 2014-02-26 DIAGNOSIS — Z510 Encounter for antineoplastic radiation therapy: Secondary | ICD-10-CM | POA: Diagnosis not present

## 2014-02-26 DIAGNOSIS — C61 Malignant neoplasm of prostate: Secondary | ICD-10-CM

## 2014-02-26 NOTE — Progress Notes (Signed)
   Weekly Management Note:  outpatient Current Dose:  70 Gy  Projected Dose: 76 Gy  Prostate  Narrative:  The patient presents for routine under treatment assessment.  CBCT/MVCT images/Port film x-rays were reviewed.  The chart was checked. Doing well. Urinary sx better on Flomax, no new c/o.  Physical Findings:  weight is 176 lb 6.4 oz (80.015 kg). His temperature is 98 F (36.7 C). His blood pressure is 142/66 and his pulse is 71. His respiration is 20.  NAD  Impression:  The patient is tolerating radiotherapy.  Plan:  Continue radiotherapy as planned.   ________________________________   Eppie Gibson, M.D.

## 2014-02-26 NOTE — Progress Notes (Signed)
Patient denies pain, bowel issues, loss of appetite. He reports less nocturia since beginning Flomax, no other bladder issues reported.

## 2014-02-27 ENCOUNTER — Ambulatory Visit
Admission: RE | Admit: 2014-02-27 | Discharge: 2014-02-27 | Disposition: A | Discharge status: Home / Self Care | Payer: Medicare Other | Source: Ambulatory Visit | Attending: Radiation Oncology | Admitting: Radiation Oncology

## 2014-02-27 ENCOUNTER — Ambulatory Visit: Admission: RE | Admit: 2014-02-27 | Payer: Medicare Other | Source: Ambulatory Visit | Admitting: Radiation Oncology

## 2014-02-27 DIAGNOSIS — Z510 Encounter for antineoplastic radiation therapy: Secondary | ICD-10-CM | POA: Diagnosis not present

## 2014-02-28 ENCOUNTER — Ambulatory Visit
Admission: RE | Admit: 2014-02-28 | Discharge: 2014-02-28 | Disposition: A | Discharge status: Home / Self Care | Payer: Medicare Other | Source: Ambulatory Visit | Attending: Radiation Oncology | Admitting: Radiation Oncology

## 2014-02-28 ENCOUNTER — Encounter: Payer: Self-pay | Admitting: Radiation Oncology

## 2014-02-28 VITALS — BP 151/84 | HR 66 | Resp 20 | Wt 174.3 lb

## 2014-02-28 DIAGNOSIS — C61 Malignant neoplasm of prostate: Secondary | ICD-10-CM

## 2014-02-28 DIAGNOSIS — Z510 Encounter for antineoplastic radiation therapy: Secondary | ICD-10-CM | POA: Diagnosis not present

## 2014-02-28 NOTE — Progress Notes (Signed)
Patient denies pain, fatigue, loss of appetite, bowel issues. He states no new urinary issues, Flomax continues to be helpful w/frequency. Pt will complete treatment tomorrow.

## 2014-02-28 NOTE — Progress Notes (Signed)
   Weekly Management Note:  outpatient Current Dose:  74 Gy  Projected Dose: 76 Gy  Prostate  Narrative:  The patient presents for routine under treatment assessment.  CBCT/MVCT images/Port film x-rays were reviewed.  The chart was checked. No new complaints.   Physical Findings:  weight is 174 lb 4.8 oz (79.062 kg). His blood pressure is 151/84 and his pulse is 66. His respiration is 20.  NAD  Impression:  The patient is tolerating radiotherapy.  Plan:  Continue radiotherapy as planned. 1 mo f/u scheduled  ________________________________   Eppie Gibson, M.D.

## 2014-03-01 ENCOUNTER — Ambulatory Visit
Admission: RE | Admit: 2014-03-01 | Discharge: 2014-03-01 | Disposition: A | Discharge status: Home / Self Care | Payer: Medicare Other | Source: Ambulatory Visit | Attending: Radiation Oncology | Admitting: Radiation Oncology

## 2014-03-01 DIAGNOSIS — Z510 Encounter for antineoplastic radiation therapy: Secondary | ICD-10-CM | POA: Diagnosis not present

## 2014-03-05 ENCOUNTER — Encounter: Payer: Self-pay | Admitting: Radiation Oncology

## 2014-03-05 NOTE — Progress Notes (Signed)
[facility] End of Treatment Note  Name:Seth Guzman  Date: 03/05/2014 OPF:[id number] DOB:October 26, 1945   Status:outpatient    CC: Foye Spurling, MD  Dr. Lowella Bandy  REFERRING PHYSICIAN:  Dr. 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 tolerated treatment well although he did have increasing urinary frequency with slowing of his urinary stream towards the end of his treatment which improved with initiation of tamsulosin.                          PLAN: Routine followup in one month. Patient instructed to call if questions or worsening complaints in interim.

## 2014-03-29 ENCOUNTER — Encounter: Payer: Self-pay | Admitting: *Deleted

## 2014-04-03 ENCOUNTER — Ambulatory Visit
Admission: RE | Admit: 2014-04-03 | Discharge: 2014-04-03 | Disposition: A | Discharge status: Home / Self Care | Payer: Medicare Other | Source: Ambulatory Visit | Attending: Radiation Oncology | Admitting: Radiation Oncology

## 2014-04-03 ENCOUNTER — Encounter: Payer: Self-pay | Admitting: Radiation Oncology

## 2014-04-03 VITALS — BP 131/68 | HR 72 | Temp 98.0°F | Resp 20 | Wt 172.0 lb

## 2014-04-03 DIAGNOSIS — C61 Malignant neoplasm of prostate: Secondary | ICD-10-CM

## 2014-04-03 NOTE — Progress Notes (Signed)
Patient denies pain, loss of appetite, bowel issues. He states he still is somewhat fatigued. He states taking Flomax has improved his urinary frequency. He denies other urinary issues. He states he "has to call Dr Sammie Bench office for a FU appointment".

## 2014-04-03 NOTE — Progress Notes (Signed)
CC: Dr. Lowella Bandy   Followup note:  Seth Guzman returns today approximately 1 month following completion of external beam/IMRT in the management of his stage TI C. versus T2 A. favorable risk adenocarcinoma prostate. He states that he is urinary frequency and force of stream are much improved. He continues with his Flomax. He still has occasional loose bowel movements but is otherwise doing well from a GI standpoint. He is getting ready to schedule a followup visit with Seth Guzman within the next one to 2 months.  Physical examination: Alert and oriented. Filed Vitals:   04/03/14 1539  BP: 131/68  Pulse: 72  Temp: 98 F (36.7 C)  Resp: 20   Rectal examination not performed today.  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 I do not feel that his loose bowels are secondary to radiation therapy. He'll stay on Flomax up until one-week before seeing Seth Guzman to see if he needs to continue with Flomax.  Plan: Followup with Seth Guzman within the next one to 2 months. I've not scheduled the patient for a formal followup visit and I ask that Seth Guzman keep me posted on his progress.

## 2014-09-04 DIAGNOSIS — C61 Malignant neoplasm of prostate: Secondary | ICD-10-CM | POA: Diagnosis not present

## 2014-09-12 DIAGNOSIS — E780 Pure hypercholesterolemia: Secondary | ICD-10-CM | POA: Diagnosis not present

## 2014-09-12 DIAGNOSIS — E119 Type 2 diabetes mellitus without complications: Secondary | ICD-10-CM | POA: Diagnosis not present

## 2014-09-12 DIAGNOSIS — I10 Essential (primary) hypertension: Secondary | ICD-10-CM | POA: Diagnosis not present

## 2014-09-24 ENCOUNTER — Encounter: Payer: Self-pay | Admitting: Cardiovascular Disease

## 2014-09-24 ENCOUNTER — Ambulatory Visit (INDEPENDENT_AMBULATORY_CARE_PROVIDER_SITE_OTHER): Payer: Medicare Other | Admitting: Cardiovascular Disease

## 2014-09-24 VITALS — BP 122/70 | HR 79 | Ht 71.5 in | Wt 173.8 lb

## 2014-09-24 DIAGNOSIS — I10 Essential (primary) hypertension: Secondary | ICD-10-CM | POA: Diagnosis not present

## 2014-09-24 DIAGNOSIS — E785 Hyperlipidemia, unspecified: Secondary | ICD-10-CM

## 2014-09-24 DIAGNOSIS — I25110 Atherosclerotic heart disease of native coronary artery with unstable angina pectoris: Secondary | ICD-10-CM

## 2014-09-24 NOTE — Progress Notes (Signed)
History of Present Illness: 69 yo male with history of diabetes mellitus, hypertension, hyperlipidemia, and coronary artery disease who is here today to re-establish cardiology care. He has not been seen in our office since January 2011. Seth Guzman most recent heart catheterization was in 2006 and showed branch vessel disease as well as moderate nonobstructive disease in the major epicardial vessels. He was noted to have a 40-50% stenosis in the distal LAD as well as 20-30% lesions in the mid vessel. There was a large ramus intermedius branch that supplied much of the lateral wall and was free of significant disease. The right coronary artery had minor luminal irregularities noted. There was an 80% stenosis in the ostium of a bifurcating second diagonal branch. There was a large RV marginal branch that had some disease. His CAD has been managed medically since 2006. Since I last saw him, he has bene diagnosed with prostate cancer. This was treated with radiation therapy.   He tells me that he has been feeling poorly. He describes feeling nauseous with chest pressure. He feels that he needs to burp. No exertional chest pressure. Overall lack of energy.   Primary Care Physician: Jeanann Lewandowsky  Last Lipid Profile: Followed in primary care   Past Medical History  Diagnosis Date  . HTN (hypertension)   . Inguinal hernia     recurrent right inguinal hernia  . Arthritis   . Hyperlipidemia   . Diabetes mellitus without complication   . Heart murmur   . Seizures   . Meatal stenosis 1966, 1967  . 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 Anxiety   . Gout   . Depression   . GERD (gastroesophageal reflux disease)   . Hx of radiation therapy 01/08/14- 03/01/14    prostate 7600 cGy 38 sessions    Past Surgical History  Procedure Laterality Date  . Hand surgery    . Inguinal hernia repair  1966, 1967    bilateral herniorrphoes  . Prostate biopsy  08/10/13    gleason  3+3=6, vol 28.67 cc    Current Outpatient Prescriptions  Medication Sig Dispense Refill  . amLODipine (NORVASC) 5 MG tablet Take 5 mg by mouth daily.    Marland Kitchen aspirin EC 81 MG tablet Take 81 mg by mouth daily.    Marland Kitchen atorvastatin (LIPITOR) 20 MG tablet Take 20 mg by mouth daily.    . carvedilol (COREG) 6.25 MG tablet Take 6.25 mg by mouth 2 (two) times daily with a meal. 6.25 mg by mouth twice daily    . fish oil-omega-3 fatty acids 1000 MG capsule Take 2 g by mouth daily.    Marland Kitchen glimepiride (AMARYL) 4 MG tablet Take 4 mg by mouth 2 (two) times daily. 4mg  by mouth twice daily    . Multiple Vitamins-Minerals (MULTIVITAMIN WITH MINERALS) tablet Take 1 tablet by mouth daily.    . sitaGLIPtin-metformin (JANUMET) 50-500 MG per tablet Take 1 tablet by mouth 2 (two) times daily with a meal.    . tamsulosin (FLOMAX) 0.4 MG CAPS capsule Take 1 capsule (0.4 mg total) by mouth daily. (Patient taking differently: Take 0.4 mg by mouth 2 (two) times daily. 0.4 mg by mouth twice daily) 30 capsule 3   No current facility-administered medications for this visit.    Allergies  Allergen Reactions  . Vicodin [Hydrocodone-Acetaminophen] Other (See Comments)    dizzy and sick feeling/Knocks him out    History   Social History  .  Marital Status: Married    Spouse Name: N/A    Number of Children: N/A  . Years of Education: N/A   Occupational History  . Not on file.   Social History Main Topics  . Smoking status: Never Smoker   . Smokeless tobacco: Never Used  . Alcohol Use: Yes     Comment: ocasionally  . Drug Use: No  . Sexual Activity: Not on file   Other Topics Concern  . Not on file   Social History Narrative    Family History  Problem Relation Age of Onset  . Diabetes Mother   . Stroke Mother   . Diabetes Sister   . Heart attack Sister   . Diabetes Brother   . Cancer Father     larynx  . Stroke Brother     Review of Systems:  As stated in the HPI and otherwise negative.   BP  122/70 mmHg  Pulse 79  Ht 5' 11.5" (1.816 m)  Wt 173 lb 12.8 oz (78.835 kg)  BMI 23.90 kg/m2  SpO2 96%  Physical Examination: General: Well developed, well nourished, NAD HEENT: OP clear, mucus membranes moist SKIN: warm, dry. No rashes. Neuro: No focal deficits Musculoskeletal: Muscle strength 5/5 all ext Psychiatric: Mood and affect normal Neck: No JVD, no carotid bruits, no thyromegaly, no lymphadenopathy. Lungs:Clear bilaterally, no wheezes, rhonci, crackles Cardiovascular: Regular rate and rhythm. No murmurs, gallops or rubs. Abdomen:Soft. Bowel sounds present. Non-tender.  Extremities: No lower extremity edema. Pulses are 2 + in the bilateral DP/PT.  Cardiac cath June 2006:  1. The left main coronary artery is fairly short, but free of significant  flow-limiting coronary atherosclerosis. 2. The left anterior descending is medium in caliber and has two fairly  small mid diagonal branches, the second of which bifurcates. The left  anterior descending proper has approximately 20-30% mid vessel stenosis  that is fairly diffuse in nature predominantly around the origin of the  second diagonal branch. In the distal vessel there is a 40-50% stenosis  noted. No clear flow-limiting stenoses are observed. The second  bifurcating diagonal branch is small, however, does have approximately  80% diffuse disease in the ostium involving the bifurcation of the  vessel and lower sub branch. 3. There is a very large ramus intermedius branch that portends much of the  lateral wall. There is a proximal branch that has diffuse 60% disease in  its proximal segment. The ramus intermedius has only minor luminal  irregularities. 4. There is a small circumflex branch that is essentially a marginal. This  vessel has no significant flow-limiting coronary sclerosis noted. 5. The right coronary artery is dominant and is noted to have  essentially a  single RV marginal branch that bifurcates proximally. There is 70%  stenosis in the proximal portion of the RV marginal branch followed by a  50% mid stenosis. The right coronary artery proper has minor luminal  irregularities with approximately 20% diffuse stenosis in the mid vessel  segment.  Left ventriculogram was performed in the RAO projection and revealed an ejection fraction of approximately 55% with no significant mitral regurgitation or focal wall motion abnormality.  DIAGNOSES: 1. Coronary atherosclerosis as outlined above essentially involving branch  vessels, specifically a small second diagonal branch, sub branch of a  larger ramus intermedius and right ventricular marginal branch of the  right coronary artery. The major epicardial vessels are free of flow-  limiting stenoses, however. 2. Left ventricular ejection fraction of approximately 55% with  no  significant mitral regurgitation and a left ventricular end-diastolic  pressure of 11 mmHg.  EKG: NSR, rate 79 bpm  Assessment and Plan:   1. CAD: He has not followed up for last five years. Now with chest pain at rest. Not clearly exertional. No recent ischemic testing. Will arrange exercise stress myoview to exclude ischemia. Continue current meds.   2. HTN: BP well controlled. Continue current meds.   3. HLD: Continue statin. Lipids are followed in primary care

## 2014-09-24 NOTE — Patient Instructions (Signed)
Your physician recommends that you schedule a follow-up appointment in: 4-6 weeks.   Your physician has requested that you have an exercise stress myoview. For further information please visit [email or web address]. Please follow instruction sheet, as given.

## 2014-09-28 ENCOUNTER — Ambulatory Visit (HOSPITAL_COMMUNITY): Payer: Medicare Other | Attending: Cardiovascular Disease | Admitting: Radiology

## 2014-09-28 DIAGNOSIS — I25110 Atherosclerotic heart disease of native coronary artery with unstable angina pectoris: Secondary | ICD-10-CM | POA: Diagnosis not present

## 2014-09-28 DIAGNOSIS — R0602 Shortness of breath: Secondary | ICD-10-CM | POA: Insufficient documentation

## 2014-09-28 DIAGNOSIS — I2510 Atherosclerotic heart disease of native coronary artery without angina pectoris: Secondary | ICD-10-CM | POA: Insufficient documentation

## 2014-09-28 DIAGNOSIS — E119 Type 2 diabetes mellitus without complications: Secondary | ICD-10-CM | POA: Diagnosis not present

## 2014-09-28 DIAGNOSIS — R0789 Other chest pain: Secondary | ICD-10-CM | POA: Insufficient documentation

## 2014-09-28 DIAGNOSIS — R42 Dizziness and giddiness: Secondary | ICD-10-CM | POA: Diagnosis not present

## 2014-09-28 DIAGNOSIS — I10 Essential (primary) hypertension: Secondary | ICD-10-CM | POA: Insufficient documentation

## 2014-09-28 MED ORDER — ADENOSINE (DIAGNOSTIC) 3 MG/ML IV SOLN
0.5600 mg/kg | Freq: Once | INTRAVENOUS | Status: AC
  Administered 2014-09-28: 44.1 mg via INTRAVENOUS

## 2014-09-28 MED ORDER — TECHNETIUM TC 99M SESTAMIBI GENERIC - CARDIOLITE
10.0000 | Freq: Once | INTRAVENOUS | Status: AC | PRN
  Administered 2014-09-28: 10 via INTRAVENOUS

## 2014-09-28 MED ORDER — TECHNETIUM TC 99M SESTAMIBI GENERIC - CARDIOLITE
30.0000 | Freq: Once | INTRAVENOUS | Status: AC | PRN
  Administered 2014-09-28: 30 via INTRAVENOUS

## 2014-09-28 NOTE — Progress Notes (Signed)
[facility] SITE [address] NUCLEAR MED [address] [place], [place] [address] [phone]    Cardiology Nuclear Med Study  Seth Guzman is a 69 y.o. male     MRN : [id number]     DOB: [birth year]  Procedure Date: 09/28/2014  Nuclear Med Background Indication for Stress Test:  Evaluation for Ischemia and follow up CAD History:  CAD Cardiac Risk Factors: Hypertension and NIDDM  Symptoms:  Chest Pressure.  (last date of chest discomfort last week), Light-Headedness and SOB   Nuclear Pre-Procedure Caffeine/Decaff Intake:  7:00pm NPO After: 7:00pm   Lungs:  clear O2 Sat: 98% on room air. IV 0.9% NS with Angio Cath:  20g  IV Site: R Antecubital  IV Started by:  Ileene Hutchinson, EMT-P  Chest Size (in):  42 Cup Size: n/a  Height: 6' (1.829 m)  Weight:  174 lb (78.926 kg)  BMI:  Body mass index is 23.59 kg/(m^2). Tech Comments:  n/a    Nuclear Med Study 1 or 2 day study: 1 day  Stress Test Type:  Adenosine  Reading MD: Liane Comber, MD  Order Authorizing Provider:  C. McAlhany, MD  Resting Radionuclide: Technetium 38m Sestamibi  Resting Radionuclide Dose: 11.0 mCi   Stress Radionuclide:  Technetium 68m Sestamibi  Stress Radionuclide Dose: 33.0 mCi           Stress Protocol Rest HR: 67 Stress HR: 89  Rest BP: 155/83 Stress BP: 152/82  Exercise Time (min): n/a METS: n/a   Predicted Max HR: 150 bpm % Max HR: 59.33 bpm Rate Pressure Product: 12905   Dose of Adenosine (mg):  44.2 Dose of Lexiscan: n/a mg  Dose of Atropine (mg): n/a Dose of Dobutamine: n/a mcg/kg/min (at max HR)  Stress Test Technologist: Ileene Hutchinson, EMT-P  Nuclear Technologist:  Earl Many, CNMT     Rest Procedure:  Myocardial perfusion imaging was performed at rest 45 minutes following the intravenous administration of Technetium 87m Sestamibi. Rest ECG: NSR, 1.AVB  Stress Procedure:  The patient received IV adenosine at 140 mcg/kg/min for 4 minutes.Pt blocked at the 2-3 min mark, went back to base  line.  Technetium 65m Sestamibi was injected at the 2 minute mark and quantitative spect images were obtained after a 45 minute delay. Stress ECG: intermittent 3.AVB with adenosine infusion  QPS Raw Data Images:  There is interference from nuclear activity from structures below the diaphragm that affects the ability to read the study. Stress Images:  There is decreased uptake in the inferior and inferolateral walls. Rest Images:  There is decreased uptake in the inferior and inferolateral walls. Subtraction (SDS):  No evidence of ischemia. Transient Ischemic Dilatation (Normal <1.22):  1.01 Lung/Heart Ratio (Normal <0.45):  0.24  Quantitative Gated Spect Images QGS EDV:  113 ml QGS ESV:  63 ml  Impression Exercise Capacity:  Adenosine study with no exercise. BP Response:  Normal blood pressure response. Clinical Symptoms:  There is dyspnea. ECG Impression:  No significant ST segment change suggestive of ischemia. Comparison with Prior Nuclear Study: No images to compare  Overall Impression:  Intermediate risk stress nuclear study with no ischemia and a fixed defect secondary to artifact (normal wall motion in the area). There is diffuse left ventricular hypokinesis with overall LVEF 44%. Echocardiogram to confirm LV dysfunction is recommended. .  LV Ejection Fraction: 44%.  LV Wall Motion:  Diffuse hypokinesis.    Dorothy Spark 09/28/2014

## 2014-10-03 ENCOUNTER — Encounter: Payer: Self-pay | Admitting: Cardiovascular Disease

## 2014-10-03 ENCOUNTER — Ambulatory Visit (INDEPENDENT_AMBULATORY_CARE_PROVIDER_SITE_OTHER): Payer: Medicare Other | Admitting: Cardiovascular Disease

## 2014-10-03 VITALS — BP 130/68 | HR 69 | Ht 72.0 in | Wt 173.2 lb

## 2014-10-03 DIAGNOSIS — I25110 Atherosclerotic heart disease of native coronary artery with unstable angina pectoris: Secondary | ICD-10-CM

## 2014-10-03 DIAGNOSIS — E785 Hyperlipidemia, unspecified: Secondary | ICD-10-CM | POA: Diagnosis not present

## 2014-10-03 DIAGNOSIS — I10 Essential (primary) hypertension: Secondary | ICD-10-CM | POA: Diagnosis not present

## 2014-10-03 MED ORDER — NITROGLYCERIN 0.4 MG SL SUBL: 0.4000 mg | SUBLINGUAL_TABLET | SUBLINGUAL | Status: AC | PRN

## 2014-10-03 NOTE — Progress Notes (Signed)
History of Present Illness: 69 yo male with history of diabetes mellitus, hypertension, hyperlipidemia, and coronary artery disease who is here today for cardiac follow up. He was seen 09/24/14 after a 5 year absence in our office. Seth Guzman most recent heart catheterization was in 2006 and showed branch vessel disease as well as moderate nonobstructive disease in the major epicardial vessels. He was noted to have a 40-50% stenosis in the distal LAD as well as 20-30% lesions in the mid vessel. There was a large ramus intermedius branch that supplied much of the lateral wall and was free of significant disease. The right coronary artery had minor luminal irregularities noted. There was an 80% stenosis in the ostium of a bifurcating second diagonal branch. There was a large RV marginal branch that had some disease. His CAD has been managed medically since 2006. Since I last saw him in 2011, he has been diagnosed with prostate cancer. This was treated with radiation therapy. When I saw him 09/24/14 he c/o nausea and chest pressure which occurred mostly at rest. NO exertional chest pressure. Chest pressure is often relieved with burping. Also noted dyspnea and fatigue. I arranged a stress myoview 09/28/14 which was an intermediate risk study with fixed defect and normal wall motion in that area, LVEF=44%. No clear ischemia.   He is here today to review the results of his stress test. He describes no change in symptoms as listed above. Still having episodes of chest pain at rest with nausea, fatigue and dyspnea. No exertional chest pressure. Overall lack of energy.   Primary Care Physician: Jeanann Lewandowsky  Last Lipid Profile: Followed in primary care   Past Medical History  Diagnosis Date  . HTN (hypertension)   . Inguinal hernia     recurrent right inguinal hernia  . Arthritis   . Hyperlipidemia   . Diabetes mellitus without complication   . Heart murmur   . Seizures   . Meatal stenosis  1966, 1967  . 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 Anxiety   . Gout   . Depression   . GERD (gastroesophageal reflux disease)   . Hx of radiation therapy 01/08/14- 03/01/14    prostate 7600 cGy 38 sessions    Past Surgical History  Procedure Laterality Date  . Hand surgery    . Inguinal hernia repair  1966, 1967    bilateral herniorrphoes  . Prostate biopsy  08/10/13    gleason 3+3=6, vol 28.67 cc    Current Outpatient Prescriptions  Medication Sig Dispense Refill  . amLODipine (NORVASC) 5 MG tablet Take 5 mg by mouth daily.    Marland Kitchen aspirin EC 81 MG tablet Take 81 mg by mouth daily.    Marland Kitchen atorvastatin (LIPITOR) 20 MG tablet Take 20 mg by mouth daily.    . carvedilol (COREG) 6.25 MG tablet Take 6.25 mg by mouth 2 (two) times daily with a meal. 6.25 mg by mouth twice daily    . fish oil-omega-3 fatty acids 1000 MG capsule Take 2 g by mouth daily.    Marland Kitchen glimepiride (AMARYL) 4 MG tablet Take 4 mg by mouth 2 (two) times daily. 4mg  by mouth twice daily    . Multiple Vitamins-Minerals (MULTIVITAMIN WITH MINERALS) tablet Take 1 tablet by mouth daily.    . sitaGLIPtin-metformin (JANUMET) 50-500 MG per tablet Take 1 tablet by mouth 2 (two) times daily with a meal.    . tamsulosin (FLOMAX) 0.4 MG  CAPS capsule Take 1 capsule (0.4 mg total) by mouth daily. (Patient taking differently: Take 0.4 mg by mouth 2 (two) times daily. 0.4 mg by mouth twice daily) 30 capsule 3   No current facility-administered medications for this visit.    Allergies  Allergen Reactions  . Vicodin [Hydrocodone-Acetaminophen] Other (See Comments)    dizzy and sick feeling/Knocks him out    History   Social History  . Marital Status: Married    Spouse Name: N/A    Number of Children: N/A  . Years of Education: N/A   Occupational History  . Not on file.   Social History Main Topics  . Smoking status: Never Smoker   . Smokeless tobacco: Never Used  . Alcohol Use: Yes     Comment:  ocasionally  . Drug Use: No  . Sexual Activity: Not on file   Other Topics Concern  . Not on file   Social History Narrative    Family History  Problem Relation Age of Onset  . Diabetes Mother   . Stroke Mother   . Diabetes Sister   . Heart attack Sister   . Diabetes Brother   . Cancer Father     larynx  . Stroke Brother     Review of Systems:  As stated in the HPI and otherwise negative.   There were no vitals taken for this visit.  Physical Examination: General: Well developed, well nourished, NAD HEENT: OP clear, mucus membranes moist SKIN: warm, dry. No rashes. Neuro: No focal deficits Musculoskeletal: Muscle strength 5/5 all ext Psychiatric: Mood and affect normal Neck: No JVD, no carotid bruits, no thyromegaly, no lymphadenopathy. Lungs:Clear bilaterally, no wheezes, rhonci, crackles Cardiovascular: Regular rate and rhythm. No murmurs, gallops or rubs. Abdomen:Soft. Bowel sounds present. Non-tender.  Extremities: No lower extremity edema. Pulses are 2 + in the bilateral DP/PT.  Cardiac cath June 2006:  1. The left main coronary artery is fairly short, but free of significant  flow-limiting coronary atherosclerosis. 2. The left anterior descending is medium in caliber and has two fairly  small mid diagonal branches, the second of which bifurcates. The left  anterior descending proper has approximately 20-30% mid vessel stenosis  that is fairly diffuse in nature predominantly around the origin of the  second diagonal branch. In the distal vessel there is a 40-50% stenosis  noted. No clear flow-limiting stenoses are observed. The second  bifurcating diagonal branch is small, however, does have approximately  80% diffuse disease in the ostium involving the bifurcation of the  vessel and lower sub branch. 3. There is a very large ramus intermedius branch that portends much of the  lateral wall. There is a proximal  branch that has diffuse 60% disease in  its proximal segment. The ramus intermedius has only minor luminal  irregularities. 4. There is a small circumflex branch that is essentially a marginal. This  vessel has no significant flow-limiting coronary sclerosis noted. 5. The right coronary artery is dominant and is noted to have essentially a  single RV marginal branch that bifurcates proximally. There is 70%  stenosis in the proximal portion of the RV marginal branch followed by a  50% mid stenosis. The right coronary artery proper has minor luminal  irregularities with approximately 20% diffuse stenosis in the mid vessel  segment.  Left ventriculogram was performed in the RAO projection and revealed an ejection fraction of approximately 55% with no significant mitral regurgitation or focal wall motion abnormality.  DIAGNOSES: 1. Coronary  atherosclerosis as outlined above essentially involving branch  vessels, specifically a small second diagonal branch, sub branch of a  larger ramus intermedius and right ventricular marginal branch of the  right coronary artery. The major epicardial vessels are free of flow-  limiting stenoses, however. 2. Left ventricular ejection fraction of approximately 55% with no  significant mitral regurgitation and a left ventricular end-diastolic  pressure of 11 mmHg.  Stress myoview 09/28/14: Stress Procedure: The patient received IV adenosine at 140 mcg/kg/min for 4 minutes.Pt blocked at the 2-3 min mark, went back to base line. Technetium 44m Sestamibi was injected at the 2 minute mark and quantitative spect images were obtained after a 45 minute delay. Stress ECG: intermittent 3.AVB with adenosine infusion  QPS Raw Data Images: There is interference from nuclear activity from structures below the diaphragm that affects the ability to read the study. Stress Images: There is decreased  uptake in the inferior and inferolateral walls. Rest Images: There is decreased uptake in the inferior and inferolateral walls. Subtraction (SDS): No evidence of ischemia. Transient Ischemic Dilatation (Normal <1.22): 1.01 Lung/Heart Ratio (Normal <0.45): 0.24  Quantitative Gated Spect Images QGS EDV: 113 ml QGS ESV: 63 ml  Impression Exercise Capacity: Adenosine study with no exercise. BP Response: Normal blood pressure response. Clinical Symptoms: There is dyspnea. ECG Impression: No significant ST segment change suggestive of ischemia. Comparison with Prior Nuclear Study: No images to compare  Overall Impression: Intermediate risk stress nuclear study with no ischemia and a fixed defect secondary to artifact (normal wall motion in the area). There is diffuse left ventricular hypokinesis with overall LVEF 44%. Echocardiogram to confirm LV dysfunction is recommended. .  LV Ejection Fraction: 44%. LV Wall Motion: Diffuse hypokinesis.   Assessment and Plan:   1. CAD/Unstable angina: He is having chest pain which may be c/w angina. Stress test is an intermediate study with reduced LV function and a fixed defect. No obvious ischemia. Given his symptoms, I have recommended that we proceed with a cardiac cath but he wishes to discuss this with his family. NTG SL prn.    2. HTN: BP well controlled. Continue current meds.   3. HLD: Continue statin. Lipids are followed in primary care

## 2014-10-03 NOTE — Patient Instructions (Signed)
Your physician recommends that you schedule a follow-up appointment in: 3 months. Scheduled for Jan 09, 2015 at 8:15  Use NTG as needed for chest pain. Follow instructions on bottle.  This has been sent to your pharmacy.  Call us if you would like to schedule procedure.

## 2014-10-24 DIAGNOSIS — I10 Essential (primary) hypertension: Secondary | ICD-10-CM | POA: Diagnosis not present

## 2014-10-24 DIAGNOSIS — E119 Type 2 diabetes mellitus without complications: Secondary | ICD-10-CM | POA: Diagnosis not present

## 2014-10-24 DIAGNOSIS — E780 Pure hypercholesterolemia: Secondary | ICD-10-CM | POA: Diagnosis not present

## 2014-10-31 ENCOUNTER — Ambulatory Visit: Payer: Medicare Other | Admitting: Cardiovascular Disease

## 2014-12-10 DIAGNOSIS — C61 Malignant neoplasm of prostate: Secondary | ICD-10-CM | POA: Diagnosis not present

## 2014-12-12 DIAGNOSIS — I10 Essential (primary) hypertension: Secondary | ICD-10-CM | POA: Diagnosis not present

## 2014-12-12 DIAGNOSIS — E780 Pure hypercholesterolemia: Secondary | ICD-10-CM | POA: Diagnosis not present

## 2014-12-12 DIAGNOSIS — E119 Type 2 diabetes mellitus without complications: Secondary | ICD-10-CM | POA: Diagnosis not present

## 2014-12-18 ENCOUNTER — Telehealth: Payer: Self-pay | Admitting: *Deleted

## 2014-12-18 NOTE — Telephone Encounter (Signed)
Pt brought clearance form into office to be completed. Form is from [facility] (Dr. Zenda Alpers). Pt is scheduled to have 3 teeth extracted today.  Dentist does not need to stop ASA for extractions.  I spoke with pt who reports he is feeling OK.  Reports chest pain on exertion that goes away on it's own with rest.  This has not worsened since last office visit.  I reviewed with Dr. Angelena Form and Richardson Dopp, PA and pt may proceed with extractions as long as procedure is done under local anesthesia. I spoke with Stanton Kidney at Dr. Ranee Gosselin office and confirmed they are only going to numb area to remove teeth.  Clearance form completed by Richardson Dopp, PA and given to pt.

## 2014-12-20 DIAGNOSIS — C61 Malignant neoplasm of prostate: Secondary | ICD-10-CM | POA: Diagnosis not present

## 2015-01-09 ENCOUNTER — Ambulatory Visit (INDEPENDENT_AMBULATORY_CARE_PROVIDER_SITE_OTHER): Payer: Medicare Other | Admitting: Cardiovascular Disease

## 2015-01-09 ENCOUNTER — Encounter: Payer: Self-pay | Admitting: *Deleted

## 2015-01-09 ENCOUNTER — Encounter: Payer: Self-pay | Admitting: Cardiovascular Disease

## 2015-01-09 VITALS — BP 132/68 | HR 68 | Ht 71.0 in | Wt 174.8 lb

## 2015-01-09 DIAGNOSIS — E785 Hyperlipidemia, unspecified: Secondary | ICD-10-CM

## 2015-01-09 DIAGNOSIS — I10 Essential (primary) hypertension: Secondary | ICD-10-CM | POA: Diagnosis not present

## 2015-01-09 DIAGNOSIS — I25700 Atherosclerosis of coronary artery bypass graft(s), unspecified, with unstable angina pectoris: Secondary | ICD-10-CM | POA: Diagnosis not present

## 2015-01-09 LAB — CBC WITH DIFFERENTIAL/PLATELET
BASOS ABS: 0 10*3/uL (ref 0.0–0.1)
Basophils Relative: 0.6 % (ref 0.0–3.0)
EOS ABS: 0.1 10*3/uL (ref 0.0–0.7)
Eosinophils Relative: 1.6 % (ref 0.0–5.0)
HCT: 41.9 % (ref 39.0–52.0)
Hemoglobin: 13.9 g/dL (ref 13.0–17.0)
LYMPHS ABS: 1.3 10*3/uL (ref 0.7–4.0)
Lymphocytes Relative: 20.8 % (ref 12.0–46.0)
MCHC: 33.3 g/dL (ref 30.0–36.0)
MCV: 82.8 fl (ref 78.0–100.0)
MONO ABS: 0.6 10*3/uL (ref 0.1–1.0)
Monocytes Relative: 9.1 % (ref 3.0–12.0)
NEUTROS ABS: 4.2 10*3/uL (ref 1.4–7.7)
Neutrophils Relative %: 67.9 % (ref 43.0–77.0)
Platelets: 218 10*3/uL (ref 150.0–400.0)
RBC: 5.06 Mil/uL (ref 4.22–5.81)
RDW: 14.2 % (ref 11.5–15.5)
WBC: 6.1 10*3/uL (ref 4.0–10.5)

## 2015-01-09 LAB — PROTIME-INR
INR: 1 ratio (ref 0.8–1.0)
Prothrombin Time: 11.1 s (ref 9.6–13.1)

## 2015-01-09 LAB — BASIC METABOLIC PANEL
BUN: 12 mg/dL (ref 6–23)
CALCIUM: 9.7 mg/dL (ref 8.4–10.5)
CHLORIDE: 103 meq/L (ref 96–112)
CO2: 31 meq/L (ref 19–32)
CREATININE: 0.84 mg/dL (ref 0.40–1.50)
GFR: 115.92 mL/min (ref >60.00)
GLUCOSE: 238 mg/dL — AB (ref 70–99)
Potassium: 4.8 mEq/L (ref 3.5–5.1)
Sodium: 138 mEq/L (ref 135–145)

## 2015-01-09 NOTE — Patient Instructions (Signed)
Medication Instructions:  Your physician recommends that you continue on your current medications as directed. Please refer to the Current Medication list given to you today.   Labwork: Lab work to be done today--BMP, CBC, PT  Testing/Procedures: Your physician has requested that you have a cardiac catheterization. Cardiac catheterization is used to diagnose and/or treat various heart conditions. Doctors may recommend this procedure for a number of different reasons. The most common reason is to evaluate chest pain. Chest pain can be a symptom of coronary artery disease (CAD), and cardiac catheterization can show whether plaque is narrowing or blocking your heart's arteries. This procedure is also used to evaluate the valves, as well as measure the blood flow and oxygen levels in different parts of your heart. For further information please visit [email or web address]. Please follow instruction sheet, as given. Scheduled for Jan 14, 2015    Follow-Up: Your physician recommends that you schedule a follow-up appointment in: about 5 weeks with PA or NP

## 2015-01-09 NOTE — Progress Notes (Signed)
Chief Complaint  Patient presents with  . Coronary Artery Disease  . Labs Only    History of Present Illness: 69 yo male with history of diabetes mellitus, hypertension, hyperlipidemia, and coronary artery disease who is here today for cardiac follow up. He was seen 09/24/14 after a 5 year absence in our office. Mr. Goodley most recent heart catheterization was in 2006 and showed branch vessel disease as well as moderate nonobstructive disease in the major epicardial vessels. He was noted to have a 40-50% stenosis in the distal LAD as well as 20-30% lesions in the mid vessel. There was a large ramus intermedius branch that supplied much of the lateral wall and was free of significant disease. The right coronary artery had minor luminal irregularities noted. There was an 80% stenosis in the ostium of a bifurcating second diagonal branch. There was a large RV marginal branch that had some disease. His CAD has been managed medically since 2006. Since I last saw him in 2011, he has been diagnosed with prostate cancer. This was treated with radiation therapy. When I saw him 09/24/14 he c/o nausea and chest pressure which occurred mostly at rest. NO exertional chest pressure. Chest pressure is often relieved with burping. Also noted dyspnea and fatigue. I arranged a stress myoview 09/28/14 which was an intermediate risk study with fixed defect and normal wall motion in that area, LVEF=44%. No clear ischemia. I saw him in February 2016 and he refused to consider a cardiac cath.   He is here today for follow up. He describes chest pressure when mowing the grass associated with SOB. Also has chest pressure when walking. Overall lack of energy.   Primary Care Physician: Jeanann Lewandowsky  Last Lipid Profile: Followed in primary care   Past Medical History  Diagnosis Date  . HTN (hypertension)   . Inguinal hernia     recurrent right inguinal hernia  . Arthritis   . Hyperlipidemia   . Diabetes mellitus  without complication   . Heart murmur   . Seizures   . Meatal stenosis 1966, 1967  . 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 Anxiety   . Gout   . Depression   . GERD (gastroesophageal reflux disease)   . Hx of radiation therapy 01/08/14- 03/01/14    prostate 7600 cGy 38 sessions    Past Surgical History  Procedure Laterality Date  . Hand surgery    . Inguinal hernia repair  1966, 1967    bilateral herniorrphoes  . Prostate biopsy  08/10/13    gleason 3+3=6, vol 28.67 cc    Current Outpatient Prescriptions  Medication Sig Dispense Refill  . amLODipine (NORVASC) 5 MG tablet Take 5 mg by mouth daily.    Marland Kitchen aspirin EC 81 MG tablet Take 81 mg by mouth daily.    Marland Kitchen atorvastatin (LIPITOR) 20 MG tablet Take 20 mg by mouth daily.    . carvedilol (COREG) 6.25 MG tablet Take 6.25 mg by mouth 2 (two) times daily with a meal. 6.25 mg by mouth twice daily    . fish oil-omega-3 fatty acids 1000 MG capsule Take 2 g by mouth daily.    Marland Kitchen glimepiride (AMARYL) 4 MG tablet Take 4 mg by mouth 2 (two) times daily. 4mg  by mouth twice daily    . Multiple Vitamins-Minerals (MULTIVITAMIN WITH MINERALS) tablet Take 1 tablet by mouth daily.    . nitroGLYCERIN (NITROSTAT) 0.4 MG SL tablet Place 1 tablet (  0.4 mg total) under the tongue every 5 (five) minutes as needed for chest pain. 25 tablet 6  . sitaGLIPtin-metformin (JANUMET) 50-500 MG per tablet Take 1 tablet by mouth 2 (two) times daily with a meal.    . tamsulosin (FLOMAX) 0.4 MG CAPS capsule Take 1 capsule (0.4 mg total) by mouth daily. (Patient taking differently: Take 0.4 mg by mouth 2 (two) times daily. 0.4 mg by mouth twice daily) 30 capsule 3   No current facility-administered medications for this visit.    Allergies  Allergen Reactions  . Vicodin [Hydrocodone-Acetaminophen] Other (See Comments)    dizzy and sick feeling/Knocks him out    History   Social History  . Marital Status: Married    Spouse Name: N/A  . Number of  Children: N/A  . Years of Education: N/A   Occupational History  . Not on file.   Social History Main Topics  . Smoking status: Never Smoker   . Smokeless tobacco: Never Used  . Alcohol Use: Yes     Comment: ocasionally  . Drug Use: No  . Sexual Activity: Not on file   Other Topics Concern  . Not on file   Social History Narrative    Family History  Problem Relation Age of Onset  . Diabetes Mother   . Stroke Mother   . Diabetes Sister   . Heart attack Sister   . Diabetes Brother   . Cancer Father     larynx  . Stroke Brother     Review of Systems:  As stated in the HPI and otherwise negative.   BP 132/68 mmHg  Pulse 68  Ht 5\' 11"  (1.803 m)  Wt 174 lb 12.8 oz (79.289 kg)  BMI 24.39 kg/m2  Physical Examination: General: Well developed, well nourished, NAD HEENT: OP clear, mucus membranes moist SKIN: warm, dry. No rashes. Neuro: No focal deficits Musculoskeletal: Muscle strength 5/5 all ext Psychiatric: Mood and affect normal Neck: No JVD, no carotid bruits, no thyromegaly, no lymphadenopathy. Lungs:Clear bilaterally, no wheezes, rhonci, crackles Cardiovascular: Regular rate and rhythm. No murmurs, gallops or rubs. Abdomen:Soft. Bowel sounds present. Non-tender.  Extremities: No lower extremity edema. Pulses are 2 + in the bilateral DP/PT.  Cardiac cath June 2006:  1. The left main coronary artery is fairly short, but free of significant  flow-limiting coronary atherosclerosis. 2. The left anterior descending is medium in caliber and has two fairly  small mid diagonal branches, the second of which bifurcates. The left  anterior descending proper has approximately 20-30% mid vessel stenosis  that is fairly diffuse in nature predominantly around the origin of the  second diagonal branch. In the distal vessel there is a 40-50% stenosis  noted. No clear flow-limiting stenoses are observed. The second  bifurcating diagonal branch is  small, however, does have approximately  80% diffuse disease in the ostium involving the bifurcation of the  vessel and lower sub branch. 3. There is a very large ramus intermedius branch that portends much of the  lateral wall. There is a proximal branch that has diffuse 60% disease in  its proximal segment. The ramus intermedius has only minor luminal  irregularities. 4. There is a small circumflex branch that is essentially a marginal. This  vessel has no significant flow-limiting coronary sclerosis noted. 5. The right coronary artery is dominant and is noted to have essentially a  single RV marginal branch that bifurcates proximally. There is 70%  stenosis in the proximal portion of the RV  marginal branch followed by a  50% mid stenosis. The right coronary artery proper has minor luminal  irregularities with approximately 20% diffuse stenosis in the mid vessel  segment.  Left ventriculogram was performed in the RAO projection and revealed an ejection fraction of approximately 55% with no significant mitral regurgitation or focal wall motion abnormality.  DIAGNOSES: 1. Coronary atherosclerosis as outlined above essentially involving branch  vessels, specifically a small second diagonal branch, sub branch of a  larger ramus intermedius and right ventricular marginal branch of the  right coronary artery. The major epicardial vessels are free of flow-  limiting stenoses, however. 2. Left ventricular ejection fraction of approximately 55% with no  significant mitral regurgitation and a left ventricular end-diastolic  pressure of 11 mmHg.  Stress myoview 09/28/14: Stress Procedure: The patient received IV adenosine at 140 mcg/kg/min for 4 minutes.Pt blocked at the 2-3 min mark, went back to base line. Technetium 51m Sestamibi was injected at the 2 minute mark and quantitative spect images were obtained  after a 45 minute delay. Stress ECG: intermittent 3.AVB with adenosine infusion  QPS Raw Data Images: There is interference from nuclear activity from structures below the diaphragm that affects the ability to read the study. Stress Images: There is decreased uptake in the inferior and inferolateral walls. Rest Images: There is decreased uptake in the inferior and inferolateral walls. Subtraction (SDS): No evidence of ischemia. Transient Ischemic Dilatation (Normal <1.22): 1.01 Lung/Heart Ratio (Normal <0.45): 0.24  Quantitative Gated Spect Images QGS EDV: 113 ml QGS ESV: 63 ml  Impression Exercise Capacity: Adenosine study with no exercise. BP Response: Normal blood pressure response. Clinical Symptoms: There is dyspnea. ECG Impression: No significant ST segment change suggestive of ischemia. Comparison with Prior Nuclear Study: No images to compare  Overall Impression: Intermediate risk stress nuclear study with no ischemia and a fixed defect secondary to artifact (normal wall motion in the area). There is diffuse left ventricular hypokinesis with overall LVEF 44%. Echocardiogram to confirm LV dysfunction is recommended. .  LV Ejection Fraction: 44%. LV Wall Motion: Diffuse hypokinesis.   EKG:  EKG is not ordered today. The ekg ordered today demonstrates   Recent Labs: 01/09/2015: BUN 12; Creatinine 0.84; Hemoglobin 13.9; Platelets 218.0; Potassium 4.8; Sodium 138   Lipid Panel    Component Value Date/Time   CHOL 167 03/18/2009 0902   TRIG 52.0 03/18/2009 0902   HDL 57.10 03/18/2009 0902   CHOLHDL 3 03/18/2009 0902   VLDL 10.4 03/18/2009 0902   LDLCALC 100* 03/18/2009 0902   LDLDIRECT 148.4 12/28/2007 1545     Wt Readings from Last 3 Encounters:  01/09/15 174 lb 12.8 oz (79.289 kg)  10/03/14 173 lb 3.2 oz (78.563 kg)  09/28/14 174 lb (78.926 kg)     Other studies Reviewed: Additional studies/ records that were reviewed today include: . Review of the  above records demonstrates:    Assessment and Plan:   1. CAD/Unstable angina: He is having chest pain c/w unstable angina. Will arrange cardiac cath with possible PCI Monday may 16th at 7:30 am at [facility]. Risks and benefits reviewed with pt today. Pre-cath labs today.  NTG SL prn.    2. HTN: BP well controlled. Continue current meds.   3. HLD: Continue statin. Lipids are followed in primary care  Current medicines are reviewed at length with the patient today.  The patient does not have concerns regarding medicines.  The following changes have been made:  no change  Labs/ tests ordered  today include:   Orders Placed This Encounter  Procedures  . Basic Metabolic Panel (BMET)  . CBC w/Diff  . INR/PT    Disposition:   FU with me after cath in 3-4 weeks.   Signed, Lauree Chandler, MD 01/09/2015 2:16 PM    [facility] [place], [place],   [address] Phone: [phone]; Fax: [phone]

## 2015-01-14 ENCOUNTER — Ambulatory Visit (HOSPITAL_COMMUNITY)
Admission: RE | Admit: 2015-01-14 | Discharge: 2015-01-14 | Disposition: A | Discharge status: Home / Self Care | Payer: Medicare Other | Source: Ambulatory Visit | Attending: Cardiovascular Disease | Admitting: Cardiovascular Disease

## 2015-01-14 ENCOUNTER — Encounter (HOSPITAL_COMMUNITY): Payer: Self-pay | Admitting: Cardiovascular Disease

## 2015-01-14 ENCOUNTER — Encounter (HOSPITAL_COMMUNITY)
Admission: RE | Disposition: A | Discharge status: Home / Self Care | Payer: Medicare Other | Source: Ambulatory Visit | Attending: Cardiovascular Disease

## 2015-01-14 DIAGNOSIS — F419 Anxiety disorder, unspecified: Secondary | ICD-10-CM | POA: Diagnosis not present

## 2015-01-14 DIAGNOSIS — M1990 Unspecified osteoarthritis, unspecified site: Secondary | ICD-10-CM | POA: Insufficient documentation

## 2015-01-14 DIAGNOSIS — R569 Unspecified convulsions: Secondary | ICD-10-CM | POA: Diagnosis not present

## 2015-01-14 DIAGNOSIS — I2510 Atherosclerotic heart disease of native coronary artery without angina pectoris: Secondary | ICD-10-CM | POA: Diagnosis not present

## 2015-01-14 DIAGNOSIS — I519 Heart disease, unspecified: Secondary | ICD-10-CM | POA: Insufficient documentation

## 2015-01-14 DIAGNOSIS — Z923 Personal history of irradiation: Secondary | ICD-10-CM | POA: Insufficient documentation

## 2015-01-14 DIAGNOSIS — I25700 Atherosclerosis of coronary artery bypass graft(s), unspecified, with unstable angina pectoris: Secondary | ICD-10-CM

## 2015-01-14 DIAGNOSIS — I10 Essential (primary) hypertension: Secondary | ICD-10-CM | POA: Diagnosis not present

## 2015-01-14 DIAGNOSIS — M109 Gout, unspecified: Secondary | ICD-10-CM | POA: Diagnosis not present

## 2015-01-14 DIAGNOSIS — F329 Major depressive disorder, single episode, unspecified: Secondary | ICD-10-CM | POA: Insufficient documentation

## 2015-01-14 DIAGNOSIS — E119 Type 2 diabetes mellitus without complications: Secondary | ICD-10-CM | POA: Insufficient documentation

## 2015-01-14 DIAGNOSIS — K219 Gastro-esophageal reflux disease without esophagitis: Secondary | ICD-10-CM | POA: Insufficient documentation

## 2015-01-14 DIAGNOSIS — Z8546 Personal history of malignant neoplasm of prostate: Secondary | ICD-10-CM | POA: Diagnosis not present

## 2015-01-14 DIAGNOSIS — Z886 Allergy status to analgesic agent status: Secondary | ICD-10-CM | POA: Diagnosis not present

## 2015-01-14 DIAGNOSIS — E785 Hyperlipidemia, unspecified: Secondary | ICD-10-CM | POA: Insufficient documentation

## 2015-01-14 DIAGNOSIS — Z7982 Long term (current) use of aspirin: Secondary | ICD-10-CM | POA: Insufficient documentation

## 2015-01-14 HISTORY — PX: CARDIAC CATHETERIZATION: SHX172

## 2015-01-14 LAB — GLUCOSE, CAPILLARY
GLUCOSE-CAPILLARY: 107 mg/dL — AB (ref 65–99)
Glucose-Capillary: 167 mg/dL — ABNORMAL HIGH (ref 65–99)

## 2015-01-14 LAB — NO BLOOD PRODUCTS

## 2015-01-14 SURGERY — LEFT HEART CATH AND CORONARY ANGIOGRAPHY

## 2015-01-14 MED ORDER — SODIUM CHLORIDE 0.9 % IV SOLN: 250.0000 mL | INTRAVENOUS | Status: DC | PRN

## 2015-01-14 MED ORDER — SODIUM CHLORIDE 0.9 % WEIGHT BASED INFUSION
3.0000 mL/kg/h | INTRAVENOUS | Status: AC
  Administered 2015-01-14: 3 mL/kg/h via INTRAVENOUS

## 2015-01-14 MED ORDER — HEPARIN (PORCINE) IN NACL 2-0.9 UNIT/ML-% IJ SOLN
INTRAMUSCULAR | Status: AC
Start: 2015-01-14 — End: 2015-01-14
  Filled 2015-01-14: qty 1500

## 2015-01-14 MED ORDER — SODIUM CHLORIDE 0.9 % IJ SOLN: 3.0000 mL | INTRAMUSCULAR | Status: DC | PRN

## 2015-01-14 MED ORDER — SODIUM CHLORIDE 0.9 % IV SOLN: INTRAVENOUS | Status: AC

## 2015-01-14 MED ORDER — MIDAZOLAM HCL 2 MG/2ML IJ SOLN
INTRAMUSCULAR | Status: AC
  Filled 2015-01-14: qty 2

## 2015-01-14 MED ORDER — SODIUM CHLORIDE 0.9 % IJ SOLN: 3.0000 mL | Freq: Two times a day (BID) | INTRAMUSCULAR | Status: DC

## 2015-01-14 MED ORDER — ASPIRIN 81 MG PO CHEW
CHEWABLE_TABLET | ORAL | Status: AC
  Filled 2015-01-14: qty 1

## 2015-01-14 MED ORDER — FENTANYL CITRATE (PF) 100 MCG/2ML IJ SOLN
INTRAMUSCULAR | Status: DC | PRN
  Administered 2015-01-14: 25 ug via INTRAVENOUS

## 2015-01-14 MED ORDER — VERAPAMIL HCL 2.5 MG/ML IV SOLN
INTRAVENOUS | Status: DC | PRN
  Administered 2015-01-14: 08:00:00 via INTRA_ARTERIAL

## 2015-01-14 MED ORDER — SODIUM CHLORIDE 0.9 % WEIGHT BASED INFUSION: 1.0000 mL/kg/h | INTRAVENOUS | Status: DC

## 2015-01-14 MED ORDER — IOHEXOL 350 MG/ML SOLN
INTRAVENOUS | Status: DC | PRN
  Administered 2015-01-14: 110 mL via INTRACARDIAC

## 2015-01-14 MED ORDER — FENTANYL CITRATE (PF) 100 MCG/2ML IJ SOLN
INTRAMUSCULAR | Status: AC
  Filled 2015-01-14: qty 2

## 2015-01-14 MED ORDER — HEPARIN SODIUM (PORCINE) 1000 UNIT/ML IJ SOLN
INTRAMUSCULAR | Status: DC | PRN
  Administered 2015-01-14: 4000 [IU] via INTRAVENOUS

## 2015-01-14 MED ORDER — MIDAZOLAM HCL 2 MG/2ML IJ SOLN
INTRAMUSCULAR | Status: DC | PRN
  Administered 2015-01-14: 1 mg via INTRAVENOUS

## 2015-01-14 MED ORDER — HEPARIN SODIUM (PORCINE) 1000 UNIT/ML IJ SOLN
INTRAMUSCULAR | Status: AC
  Filled 2015-01-14: qty 1

## 2015-01-14 MED ORDER — VERAPAMIL HCL 2.5 MG/ML IV SOLN
INTRAVENOUS | Status: AC
  Filled 2015-01-14: qty 2

## 2015-01-14 MED ORDER — LIDOCAINE HCL (PF) 1 % IJ SOLN
INTRAMUSCULAR | Status: AC
  Filled 2015-01-14: qty 30

## 2015-01-14 MED ORDER — ASPIRIN 81 MG PO CHEW
81.0000 mg | CHEWABLE_TABLET | ORAL | Status: AC
  Administered 2015-01-14: 81 mg via ORAL

## 2015-01-14 MED ORDER — NITROGLYCERIN 1 MG/10 ML FOR IR/CATH LAB
INTRA_ARTERIAL | Status: AC
  Filled 2015-01-14: qty 10

## 2015-01-14 SURGICAL SUPPLY — 14 items
CATH INFINITI 5 FR JL3.5 (CATHETERS) ×3 IMPLANT
CATH INFINITI 5FR ANG PIGTAIL (CATHETERS) ×3 IMPLANT
CATH INFINITI 5FR MULTPACK ANG (CATHETERS) IMPLANT
CATH INFINITI JR4 5F (CATHETERS) ×3 IMPLANT
DEVICE RAD COMP TR BAND LRG (VASCULAR PRODUCTS) ×3 IMPLANT
GLIDESHEATH SLEND SS 6F .021 (SHEATH) ×3 IMPLANT
KIT HEART LEFT (KITS) ×3 IMPLANT
PACK CARDIAC CATHETERIZATION (CUSTOM PROCEDURE TRAY) ×3 IMPLANT
SHEATH PINNACLE 5F 10CM (SHEATH) IMPLANT
SYR MEDRAD MARK V 150ML (SYRINGE) ×3 IMPLANT
TRANSDUCER W/STOPCOCK (MISCELLANEOUS) ×3 IMPLANT
TUBING CIL FLEX 10 FLL-RA (TUBING) ×3 IMPLANT
WIRE EMERALD 3MM-J .035X150CM (WIRE) IMPLANT
WIRE SAFE-T 1.5MM-J .035X260CM (WIRE) ×3 IMPLANT

## 2015-01-14 NOTE — H&P (View-Only) (Signed)
Chief Complaint  Patient presents with  . Coronary Artery Disease  . Labs Only    History of Present Illness: 69 yo male with history of diabetes mellitus, hypertension, hyperlipidemia, and coronary artery disease who is here today for cardiac follow up. He was seen 09/24/14 after a 5 year absence in our office. Mr. Buonocore most recent heart catheterization was in 2006 and showed branch vessel disease as well as moderate nonobstructive disease in the major epicardial vessels. He was noted to have a 40-50% stenosis in the distal LAD as well as 20-30% lesions in the mid vessel. There was a large ramus intermedius branch that supplied much of the lateral wall and was free of significant disease. The right coronary artery had minor luminal irregularities noted. There was an 80% stenosis in the ostium of a bifurcating second diagonal branch. There was a large RV marginal branch that had some disease. His CAD has been managed medically since 2006. Since I last saw him in 2011, he has been diagnosed with prostate cancer. This was treated with radiation therapy. When I saw him 09/24/14 he c/o nausea and chest pressure which occurred mostly at rest. NO exertional chest pressure. Chest pressure is often relieved with burping. Also noted dyspnea and fatigue. I arranged a stress myoview 09/28/14 which was an intermediate risk study with fixed defect and normal wall motion in that area, LVEF=44%. No clear ischemia. I saw him in February 2016 and he refused to consider a cardiac cath.   He is here today for follow up. He describes chest pressure when mowing the grass associated with SOB. Also has chest pressure when walking. Overall lack of energy.   Primary Care Physician: Jeanann Lewandowsky  Last Lipid Profile: Followed in primary care   Past Medical History  Diagnosis Date  . HTN (hypertension)   . Inguinal hernia     recurrent right inguinal hernia  . Arthritis   . Hyperlipidemia   . Diabetes mellitus  without complication   . Heart murmur   . Seizures   . Meatal stenosis 1966, 1967  . 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 Anxiety   . Gout   . Depression   . GERD (gastroesophageal reflux disease)   . Hx of radiation therapy 01/08/14- 03/01/14    prostate 7600 cGy 38 sessions    Past Surgical History  Procedure Laterality Date  . Hand surgery    . Inguinal hernia repair  1966, 1967    bilateral herniorrphoes  . Prostate biopsy  08/10/13    gleason 3+3=6, vol 28.67 cc    Current Outpatient Prescriptions  Medication Sig Dispense Refill  . amLODipine (NORVASC) 5 MG tablet Take 5 mg by mouth daily.    Marland Kitchen aspirin EC 81 MG tablet Take 81 mg by mouth daily.    Marland Kitchen atorvastatin (LIPITOR) 20 MG tablet Take 20 mg by mouth daily.    . carvedilol (COREG) 6.25 MG tablet Take 6.25 mg by mouth 2 (two) times daily with a meal. 6.25 mg by mouth twice daily    . fish oil-omega-3 fatty acids 1000 MG capsule Take 2 g by mouth daily.    Marland Kitchen glimepiride (AMARYL) 4 MG tablet Take 4 mg by mouth 2 (two) times daily. 4mg  by mouth twice daily    . Multiple Vitamins-Minerals (MULTIVITAMIN WITH MINERALS) tablet Take 1 tablet by mouth daily.    . nitroGLYCERIN (NITROSTAT) 0.4 MG SL tablet Place 1 tablet (  0.4 mg total) under the tongue every 5 (five) minutes as needed for chest pain. 25 tablet 6  . sitaGLIPtin-metformin (JANUMET) 50-500 MG per tablet Take 1 tablet by mouth 2 (two) times daily with a meal.    . tamsulosin (FLOMAX) 0.4 MG CAPS capsule Take 1 capsule (0.4 mg total) by mouth daily. (Patient taking differently: Take 0.4 mg by mouth 2 (two) times daily. 0.4 mg by mouth twice daily) 30 capsule 3   No current facility-administered medications for this visit.    Allergies  Allergen Reactions  . Vicodin [Hydrocodone-Acetaminophen] Other (See Comments)    dizzy and sick feeling/Knocks him out    History   Social History  . Marital Status: Married    Spouse Name: N/A  . Number of  Children: N/A  . Years of Education: N/A   Occupational History  . Not on file.   Social History Main Topics  . Smoking status: Never Smoker   . Smokeless tobacco: Never Used  . Alcohol Use: Yes     Comment: ocasionally  . Drug Use: No  . Sexual Activity: Not on file   Other Topics Concern  . Not on file   Social History Narrative    Family History  Problem Relation Age of Onset  . Diabetes Mother   . Stroke Mother   . Diabetes Sister   . Heart attack Sister   . Diabetes Brother   . Cancer Father     larynx  . Stroke Brother     Review of Systems:  As stated in the HPI and otherwise negative.   BP 132/68 mmHg  Pulse 68  Ht 5\' 11"  (1.803 m)  Wt 174 lb 12.8 oz (79.289 kg)  BMI 24.39 kg/m2  Physical Examination: General: Well developed, well nourished, NAD HEENT: OP clear, mucus membranes moist SKIN: warm, dry. No rashes. Neuro: No focal deficits Musculoskeletal: Muscle strength 5/5 all ext Psychiatric: Mood and affect normal Neck: No JVD, no carotid bruits, no thyromegaly, no lymphadenopathy. Lungs:Clear bilaterally, no wheezes, rhonci, crackles Cardiovascular: Regular rate and rhythm. No murmurs, gallops or rubs. Abdomen:Soft. Bowel sounds present. Non-tender.  Extremities: No lower extremity edema. Pulses are 2 + in the bilateral DP/PT.  Cardiac cath June 2006:  1. The left main coronary artery is fairly short, but free of significant  flow-limiting coronary atherosclerosis. 2. The left anterior descending is medium in caliber and has two fairly  small mid diagonal branches, the second of which bifurcates. The left  anterior descending proper has approximately 20-30% mid vessel stenosis  that is fairly diffuse in nature predominantly around the origin of the  second diagonal branch. In the distal vessel there is a 40-50% stenosis  noted. No clear flow-limiting stenoses are observed. The second  bifurcating diagonal branch is  small, however, does have approximately  80% diffuse disease in the ostium involving the bifurcation of the  vessel and lower sub branch. 3. There is a very large ramus intermedius branch that portends much of the  lateral wall. There is a proximal branch that has diffuse 60% disease in  its proximal segment. The ramus intermedius has only minor luminal  irregularities. 4. There is a small circumflex branch that is essentially a marginal. This  vessel has no significant flow-limiting coronary sclerosis noted. 5. The right coronary artery is dominant and is noted to have essentially a  single RV marginal branch that bifurcates proximally. There is 70%  stenosis in the proximal portion of the RV  marginal branch followed by a  50% mid stenosis. The right coronary artery proper has minor luminal  irregularities with approximately 20% diffuse stenosis in the mid vessel  segment.  Left ventriculogram was performed in the RAO projection and revealed an ejection fraction of approximately 55% with no significant mitral regurgitation or focal wall motion abnormality.  DIAGNOSES: 1. Coronary atherosclerosis as outlined above essentially involving branch  vessels, specifically a small second diagonal branch, sub branch of a  larger ramus intermedius and right ventricular marginal branch of the  right coronary artery. The major epicardial vessels are free of flow-  limiting stenoses, however. 2. Left ventricular ejection fraction of approximately 55% with no  significant mitral regurgitation and a left ventricular end-diastolic  pressure of 11 mmHg.  Stress myoview 09/28/14: Stress Procedure: The patient received IV adenosine at 140 mcg/kg/min for 4 minutes.Pt blocked at the 2-3 min mark, went back to base line. Technetium 55m Sestamibi was injected at the 2 minute mark and quantitative spect images were obtained  after a 45 minute delay. Stress ECG: intermittent 3.AVB with adenosine infusion  QPS Raw Data Images: There is interference from nuclear activity from structures below the diaphragm that affects the ability to read the study. Stress Images: There is decreased uptake in the inferior and inferolateral walls. Rest Images: There is decreased uptake in the inferior and inferolateral walls. Subtraction (SDS): No evidence of ischemia. Transient Ischemic Dilatation (Normal <1.22): 1.01 Lung/Heart Ratio (Normal <0.45): 0.24  Quantitative Gated Spect Images QGS EDV: 113 ml QGS ESV: 63 ml  Impression Exercise Capacity: Adenosine study with no exercise. BP Response: Normal blood pressure response. Clinical Symptoms: There is dyspnea. ECG Impression: No significant ST segment change suggestive of ischemia. Comparison with Prior Nuclear Study: No images to compare  Overall Impression: Intermediate risk stress nuclear study with no ischemia and a fixed defect secondary to artifact (normal wall motion in the area). There is diffuse left ventricular hypokinesis with overall LVEF 44%. Echocardiogram to confirm LV dysfunction is recommended. .  LV Ejection Fraction: 44%. LV Wall Motion: Diffuse hypokinesis.   EKG:  EKG is not ordered today. The ekg ordered today demonstrates   Recent Labs: 01/09/2015: BUN 12; Creatinine 0.84; Hemoglobin 13.9; Platelets 218.0; Potassium 4.8; Sodium 138   Lipid Panel    Component Value Date/Time   CHOL 167 03/18/2009 0902   TRIG 52.0 03/18/2009 0902   HDL 57.10 03/18/2009 0902   CHOLHDL 3 03/18/2009 0902   VLDL 10.4 03/18/2009 0902   LDLCALC 100* 03/18/2009 0902   LDLDIRECT 148.4 12/28/2007 1545     Wt Readings from Last 3 Encounters:  01/09/15 174 lb 12.8 oz (79.289 kg)  10/03/14 173 lb 3.2 oz (78.563 kg)  09/28/14 174 lb (78.926 kg)     Other studies Reviewed: Additional studies/ records that were reviewed today include: . Review of the  above records demonstrates:    Assessment and Plan:   1. CAD/Unstable angina: He is having chest pain c/w unstable angina. Will arrange cardiac cath with possible PCI Monday may 16th at 7:30 am at [facility]. Risks and benefits reviewed with pt today. Pre-cath labs today.  NTG SL prn.    2. HTN: BP well controlled. Continue current meds.   3. HLD: Continue statin. Lipids are followed in primary care  Current medicines are reviewed at length with the patient today.  The patient does not have concerns regarding medicines.  The following changes have been made:  no change  Labs/ tests ordered  today include:   Orders Placed This Encounter  Procedures  . Basic Metabolic Panel (BMET)  . CBC w/Diff  . INR/PT    Disposition:   FU with me after cath in 3-4 weeks.   Signed, Lauree Chandler, MD 01/09/2015 2:16 PM    [facility] [place], [place], [place]  [address] Phone: [phone]; Fax: [phone]

## 2015-01-14 NOTE — Discharge Instructions (Signed)
Hold metformin for 48 hours post cath then resume at previous dose. Radial Site Care Refer to this sheet in the next few weeks. These instructions provide you with information on caring for yourself after your procedure. Your caregiver may also give you more specific instructions. Your treatment has been planned according to current medical practices, but problems sometimes occur. Call your caregiver if you have any problems or questions after your procedure. HOME CARE INSTRUCTIONS  You may shower the day after the procedure.Remove the bandage (dressing) and gently wash the site with plain soap and water.Gently pat the site dry.  Do not apply powder or lotion to the site.  Do not submerge the affected site in water for 3 to 5 days.  Inspect the site at least twice daily.  Do not flex or bend the affected arm for 24 hours.  No lifting over 5 pounds (2.3 kg) for 5 days after your procedure.  Do not drive home if you are discharged the same day of the procedure. Have someone else drive you.  You may drive 24 hours after the procedure unless otherwise instructed by your caregiver.  Do not operate machinery or power tools for 24 hours.  A responsible adult should be with you for the first 24 hours after you arrive home. What to expect:  Any bruising will usually fade within 1 to 2 weeks.  Blood that collects in the tissue (hematoma) may be painful to the touch. It should usually decrease in size and tenderness within 1 to 2 weeks. SEEK IMMEDIATE MEDICAL CARE IF:  You have unusual pain at the radial site.  You have redness, warmth, swelling, or pain at the radial site.  You have drainage (other than a small amount of blood on the dressing).  You have chills.  You have a fever or persistent symptoms for more than 72 hours.  You have a fever and your symptoms suddenly get worse.  Your arm becomes pale, cool, tingly, or numb.  You have heavy bleeding from the site. Hold pressure  on the site. Document Released: 09/19/2010 Document Revised: 11/09/2011 Document Reviewed: 09/19/2010 [facility] Patient Information 2015 [place], [place]. This information is not intended to replace advice given to you by your health care provider. Make sure you discuss any questions you have with your health care provider.

## 2015-01-14 NOTE — Interval H&P Note (Signed)
History and Physical Interval Note:  01/14/2015 7:25 AM  Seth Guzman  has presented today for cardiac cath with the diagnosis of chest pain/CAD. The various methods of treatment have been discussed with the patient and family. After consideration of risks, benefits and other options for treatment, the patient has consented to  Procedure(s): Left Heart Cath and Coronary Angiography (N/A) as a surgical intervention .  The patient's history has been reviewed, patient examined, no change in status, stable for surgery.  I have reviewed the patient's chart and labs.  Questions were answered to the patient's satisfaction.    Cath Lab Visit (complete for each Cath Lab visit)  Clinical Evaluation Leading to the Procedure:   ACS: No.  Non-ACS:    Anginal Classification: CCS III  Anti-ischemic medical therapy: Maximal Therapy (2 or more classes of medications)  Non-Invasive Test Results: Intermediate-risk stress test findings: cardiac mortality 1-3%/year  Prior CABG: No previous CABG   MCALHANY,CHRISTOPHER

## 2015-01-15 MED FILL — Heparin Sodium (Porcine) 2 Unit/ML in Sodium Chloride 0.9%: INTRAMUSCULAR | Qty: 1500 | Status: AC

## 2015-01-15 MED FILL — Lidocaine HCl Local Preservative Free (PF) Inj 1%: INTRAMUSCULAR | Qty: 30 | Status: AC

## 2015-02-18 ENCOUNTER — Encounter: Payer: Self-pay | Admitting: Physician Assistant

## 2015-02-18 ENCOUNTER — Ambulatory Visit (INDEPENDENT_AMBULATORY_CARE_PROVIDER_SITE_OTHER): Payer: Medicare Other | Admitting: Physician Assistant

## 2015-02-18 VITALS — BP 130/60 | HR 70 | Ht 72.0 in | Wt 175.8 lb

## 2015-02-18 DIAGNOSIS — E7800 Pure hypercholesterolemia, unspecified: Secondary | ICD-10-CM

## 2015-02-18 DIAGNOSIS — I10 Essential (primary) hypertension: Secondary | ICD-10-CM

## 2015-02-18 DIAGNOSIS — R079 Chest pain, unspecified: Secondary | ICD-10-CM

## 2015-02-18 DIAGNOSIS — I25118 Atherosclerotic heart disease of native coronary artery with other forms of angina pectoris: Secondary | ICD-10-CM

## 2015-02-18 MED ORDER — ISOSORBIDE MONONITRATE ER 30 MG PO TB24: 30.0000 mg | ORAL_TABLET | Freq: Every day | ORAL | Status: DC

## 2015-02-18 NOTE — Assessment & Plan Note (Signed)
BP stable.

## 2015-02-18 NOTE — Progress Notes (Signed)
Cardiology Office Note   Date:  02/18/2015   ID:  Seth Guzman, Seth Guzman 03-12-1946, MRN [id number]  PCP:  Foye Spurling, MD  Cardiologist: Dr. Angelena Form  Chief Complaint: chest burning    History of Present Illness: Seth Guzman is a 69 y.o. male who presents for post hospital follow-up after undergoing cardiac catheterization 01/14/15. He was found to have 20% proximal and mid RCA, 30% ostial LAD, 30% mid LAD, 99% distal LAD 2 lesion, 90% distal LAD one lesion, 50% second diagonal, 70% ostial third diagonal. The distal and apical LAD were too small for PCI. He had mild disease in the RCA and mild LV systolic dysfunction EF 98-92%. Medical management recommended. Patient also has history of diabetes mellitus, hypertension, hyperlipidemia. He had an abnormal stress test in January 2016 which prompted cardiac catheterization.  Patient says his chest pain has not been as frequent. He describes it as a burning indigestion type feeling associated with nausea. It occurs at rest or with exertion. He did have it while mowing the lawn and had to stop. He has not used nitroglycerin in a long time. It usually eases fairly quickly.   Past Medical History  Diagnosis Date  . HTN (hypertension)   . Inguinal hernia     recurrent right inguinal hernia  . Arthritis   . Hyperlipidemia   . Diabetes mellitus without complication   . Heart murmur   . Seizures   . Meatal stenosis 1966, 1967  . 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 Anxiety   . Gout   . Depression   . GERD (gastroesophageal reflux disease)   . Hx of radiation therapy 01/08/14- 03/01/14    prostate 7600 cGy 38 sessions    Past Surgical History  Procedure Laterality Date  . Hand surgery    . Inguinal hernia repair  1966, 1967    bilateral herniorrphoes  . Prostate biopsy  08/10/13    gleason 3+3=6, vol 28.67 cc  . Cardiac catheterization N/A 01/14/2015    Procedure: Left Heart Cath and Coronary Angiography;  Surgeon:  Burnell Blanks, MD;  Location: [facility];  Service: Cardiovascular;  Laterality: N/A;     Current Outpatient Prescriptions  Medication Sig Dispense Refill  . amLODipine (NORVASC) 5 MG tablet Take 5 mg by mouth daily.    Marland Kitchen aspirin EC 81 MG tablet Take 81 mg by mouth daily.    Marland Kitchen atorvastatin (LIPITOR) 20 MG tablet Take 20 mg by mouth daily.    . carvedilol (COREG) 6.25 MG tablet Take 6.25 mg by mouth 2 (two) times daily with a meal.     . fish oil-omega-3 fatty acids 1000 MG capsule Take 1 g by mouth daily.     Marland Kitchen glimepiride (AMARYL) 4 MG tablet Take 4 mg by mouth 2 (two) times daily.     . Multiple Vitamins-Minerals (MULTIVITAMIN WITH MINERALS) tablet Take 1 tablet by mouth daily.    . naproxen sodium (ANAPROX) 220 MG tablet Take 440 mg by mouth 2 (two) times daily as needed (back pain).    . nitroGLYCERIN (NITROSTAT) 0.4 MG SL tablet Place 1 tablet (0.4 mg total) under the tongue every 5 (five) minutes as needed for chest pain. 25 tablet 6  . tamsulosin (FLOMAX) 0.4 MG CAPS capsule Take 1 capsule (0.4 mg total) by mouth daily. (Patient not taking: Reported on 01/11/2015) 30 capsule 3   No current facility-administered medications for this visit.  Allergies:   Vicodin    Social History:  The patient  reports that he has never smoked. He has never used smokeless tobacco. He reports that he drinks alcohol. He reports that he does not use illicit drugs.   Family History:  The patient's    family history includes Cancer in his father; Diabetes in his brother, mother, and sister; Heart attack in his sister; Stroke in his brother and mother.    ROS:  Please see the history of present illness.   Otherwise, review of systems are positive for none.   All other systems are reviewed and negative.    PHYSICAL EXAM: VS:  There were no vitals taken for this visit. , BMI There is no weight on file to calculate BMI. GEN: Well nourished, well developed, in no acute distress Neck: no  JVD, HJR, carotid bruits, or masses Cardiac: RRR; no murmurs,gallop, rubs, thrill or heave,  Respiratory:  clear to auscultation bilaterally, normal work of breathing GI: soft, nontender, nondistended, + BS MS: no deformity or atrophy Extremities: without cyanosis, clubbing, edema, good distal pulses bilaterally.  Skin: warm and dry, no rash Neuro:  Strength and sensation are intact    EKG:  EKG is ordered today. The ekg ordered today demonstrates normal sinus rhythm with first-degree AV block   Recent Labs: 01/09/2015: BUN 12; Creatinine, Ser 0.84; Hemoglobin 13.9; Platelets 218.0; Potassium 4.8; Sodium 138    Lipid Panel    Component Value Date/Time   CHOL 167 03/18/2009 0902   TRIG 52.0 03/18/2009 0902   HDL 57.10 03/18/2009 0902   CHOLHDL 3 03/18/2009 0902   VLDL 10.4 03/18/2009 0902   LDLCALC 100* 03/18/2009 0902   LDLDIRECT 148.4 12/28/2007 1545      Wt Readings from Last 3 Encounters:  01/14/15 175 lb (79.379 kg)  01/09/15 174 lb 12.8 oz (79.289 kg)  10/03/14 173 lb 3.2 oz (78.563 kg)      Other studies Reviewed: Additional studies/ records that were reviewed today include and review of the records demonstrates:   Catheterization 01/14/15 Coronary Findings     Dominance: Right    Left Anterior Descending  The vessel , is large .   Marland Kitchen Ost LAD to Prox LAD lesion, 30% stenosed. diffuse .   Marland Kitchen Mid LAD lesion, 30% stenosed. diffuse .   Marland Kitchen Dist LAD-1 lesion, 90% stenosed. diffuse . The vessel is small caliber in this segment and diffusely diseased.   . Dist LAD-2 lesion, 99% stenosed. discrete . The vessel is small caliber in this segment.   . Second Diagonal Branch   The vessel is small in size.   . 2nd Diag lesion, 50% stenosed. diffuse .   Marland Kitchen Third Diagonal Branch   The vessel is small in size.   Colon Flattery 3rd Diag to 3rd Diag lesion, 70% stenosed. diffuse . The vessel is small and diffusely diseased.      Ramus Intermedius  The vessel , is large is  angiographically normal.      Left Circumflex  The vessel , is small is angiographically normal.      Right Coronary Artery   . Prox RCA to Mid RCA lesion, 20% stenosed. diffuse .       Wall Motion                   Left Heart     Left Ventricle The left ventricular size is normal. There is mild left ventricular systolic dysfunction. The left ventricular  ejection fraction is 45-50% by visual estimate. There are wall motion abnormalities in the left ventricle.      Conclusion      Prox RCA to Mid RCA lesion, 20% stenosed.  Ost LAD to Prox LAD lesion, 30% stenosed.  Mid LAD lesion, 30% stenosed.  Dist LAD-2 lesion, 99% stenosed.  Dist LAD-1 lesion, 90% stenosed.  2nd Diag lesion, 50% stenosed.  Ost 3rd Diag to 3rd Diag lesion, 70% stenosed.   1. Single vessel CAD with diffuse disease in the small caliber distal and apical LAD, too small for PCI.   2. Diffuse disease in the small caliber diagonal branch, too small for PCI 3. No obstructive disease in the large ramus intermediate branch 4. Mild disease in the RCA 5. Mild LV systolic dysfunction  Recommendations: Will continue medical management of CAD.        Stress myoview 09/28/14: Stress Procedure:  The patient received IV adenosine at 140 mcg/kg/min for 4 minutes.Pt blocked at the 2-3 min mark, went back to base line.  Technetium 75m Sestamibi was injected at the 2 minute mark and quantitative spect images were obtained after a 45 minute delay. Stress ECG: intermittent 3.AVB with adenosine infusion  QPS Raw Data Images:  There is interference from nuclear activity from structures below the diaphragm that affects the ability to read the study. Stress Images:  There is decreased uptake in the inferior and inferolateral walls. Rest Images:  There is decreased uptake in the inferior and inferolateral walls. Subtraction (SDS):  No evidence of ischemia. Transient Ischemic Dilatation (Normal <1.22):  1.01 Lung/Heart  Ratio (Normal <0.45):  0.24  Quantitative Gated Spect Images QGS EDV:  113 ml QGS ESV:  63 ml  Impression Exercise Capacity:  Adenosine study with no exercise. BP Response:  Normal blood pressure response. Clinical Symptoms:  There is dyspnea. ECG Impression:  No significant ST segment change suggestive of ischemia. Comparison with Prior Nuclear Study: No images to compare  Overall Impression:  Intermediate risk stress nuclear study with no ischemia and a fixed defect secondary to artifact (normal wall motion in the area). There is diffuse left ventricular hypokinesis with overall LVEF 44%. Echocardiogram to confirm LV dysfunction is recommended. .  LV Ejection Fraction: 44%.  LV Wall Motion:  Diffuse hypokinesis.     ASSESSMENT AND PLAN:  CORONARY ATHEROSCLEROSIS, NATIVE VESSEL Patient had repeat cardiac catheterization 01/14/15. This revealed single-vessel CAD with diffuse disease and small caliber LAD 2 small for PCI. He had diffuse disease in the small caliber diagonal branch to small for PCI. Nonobstructive disease of the large ramus intermediate, mild disease in the RCA, mild LV systolic dysfunction EF 22-02%. Medical management recommended. Patient continues to have some angina. Will try Imdur 30 mg daily. His blood pressure has room to add medication. We could also increase his Coreg. We'll hold off on that for now. Follow-up with Dr.McAlhany in 2 months.  HYPERTENSION, BENIGN BP stable.  PURE HYPERCHOLESTEROLEMIA Continue Lipitor.    Sumner Boast, PA-C  02/18/2015 8:13 AM    [facility] [place], [place], [place]  [address] Phone: [phone]; Fax: [phone]

## 2015-02-18 NOTE — Patient Instructions (Signed)
Medication Instructions:     START TAKING IMDUR 30 MG ONCE A DAY   Labwork:   Testing/Procedures:   Follow-Up:  WITH DR [facility] IN 3  MONTHS OR NEXT AVAILABLE APPT   Any Other Special Instructions Will Be Listed Below (If Applicable).

## 2015-02-18 NOTE — Assessment & Plan Note (Signed)
-  Continue Lipitor °

## 2015-02-18 NOTE — Assessment & Plan Note (Signed)
Patient had repeat cardiac catheterization 01/14/15. This revealed single-vessel CAD with diffuse disease and small caliber LAD 2 small for PCI. He had diffuse disease in the small caliber diagonal branch to small for PCI. Nonobstructive disease of the large ramus intermediate, mild disease in the RCA, mild LV systolic dysfunction EF 16-96%. Medical management recommended. Patient continues to have some angina. Will try Imdur 30 mg daily. His blood pressure has room to add medication. We could also increase his Coreg. We'll hold off on that for now. Follow-up with Dr.McAlhany in 2 months.

## 2015-03-13 DIAGNOSIS — I10 Essential (primary) hypertension: Secondary | ICD-10-CM | POA: Diagnosis not present

## 2015-03-13 DIAGNOSIS — E119 Type 2 diabetes mellitus without complications: Secondary | ICD-10-CM | POA: Diagnosis not present

## 2015-03-13 DIAGNOSIS — E780 Pure hypercholesterolemia: Secondary | ICD-10-CM | POA: Diagnosis not present

## 2015-03-21 DIAGNOSIS — R300 Dysuria: Secondary | ICD-10-CM | POA: Diagnosis not present

## 2015-03-21 DIAGNOSIS — C61 Malignant neoplasm of prostate: Secondary | ICD-10-CM | POA: Diagnosis not present

## 2015-03-21 DIAGNOSIS — N401 Enlarged prostate with lower urinary tract symptoms: Secondary | ICD-10-CM | POA: Diagnosis not present

## 2015-03-27 ENCOUNTER — Other Ambulatory Visit: Payer: Self-pay | Admitting: Internal Medicine

## 2015-03-27 DIAGNOSIS — R109 Unspecified abdominal pain: Secondary | ICD-10-CM

## 2015-03-27 DIAGNOSIS — R1012 Left upper quadrant pain: Secondary | ICD-10-CM | POA: Diagnosis not present

## 2015-03-27 DIAGNOSIS — E119 Type 2 diabetes mellitus without complications: Secondary | ICD-10-CM | POA: Diagnosis not present

## 2015-03-27 DIAGNOSIS — E780 Pure hypercholesterolemia: Secondary | ICD-10-CM | POA: Diagnosis not present

## 2015-03-27 DIAGNOSIS — R319 Hematuria, unspecified: Secondary | ICD-10-CM | POA: Diagnosis not present

## 2015-03-27 DIAGNOSIS — I10 Essential (primary) hypertension: Secondary | ICD-10-CM | POA: Diagnosis not present

## 2015-03-28 ENCOUNTER — Ambulatory Visit (HOSPITAL_COMMUNITY)
Admission: RE | Admit: 2015-03-28 | Discharge: 2015-03-28 | Disposition: A | Discharge status: Home / Self Care | Payer: Medicare Other | Source: Ambulatory Visit | Attending: Internal Medicine | Admitting: Internal Medicine

## 2015-03-28 ENCOUNTER — Other Ambulatory Visit: Payer: Self-pay | Admitting: Internal Medicine

## 2015-03-28 DIAGNOSIS — D1809 Hemangioma of other sites: Secondary | ICD-10-CM | POA: Diagnosis not present

## 2015-03-28 DIAGNOSIS — D1803 Hemangioma of intra-abdominal structures: Secondary | ICD-10-CM | POA: Diagnosis not present

## 2015-03-28 DIAGNOSIS — I2510 Atherosclerotic heart disease of native coronary artery without angina pectoris: Secondary | ICD-10-CM | POA: Insufficient documentation

## 2015-03-28 DIAGNOSIS — R109 Unspecified abdominal pain: Secondary | ICD-10-CM

## 2015-03-28 DIAGNOSIS — Z8546 Personal history of malignant neoplasm of prostate: Secondary | ICD-10-CM | POA: Insufficient documentation

## 2015-03-28 DIAGNOSIS — I700 Atherosclerosis of aorta: Secondary | ICD-10-CM | POA: Insufficient documentation

## 2015-03-28 DIAGNOSIS — Z923 Personal history of irradiation: Secondary | ICD-10-CM | POA: Diagnosis not present

## 2015-03-28 DIAGNOSIS — N329 Bladder disorder, unspecified: Secondary | ICD-10-CM | POA: Insufficient documentation

## 2015-03-28 DIAGNOSIS — N3289 Other specified disorders of bladder: Secondary | ICD-10-CM | POA: Diagnosis not present

## 2015-04-24 DIAGNOSIS — E119 Type 2 diabetes mellitus without complications: Secondary | ICD-10-CM | POA: Diagnosis not present

## 2015-04-24 DIAGNOSIS — E780 Pure hypercholesterolemia: Secondary | ICD-10-CM | POA: Diagnosis not present

## 2015-04-24 DIAGNOSIS — I10 Essential (primary) hypertension: Secondary | ICD-10-CM | POA: Diagnosis not present

## 2015-06-18 DIAGNOSIS — C61 Malignant neoplasm of prostate: Secondary | ICD-10-CM | POA: Diagnosis not present

## 2015-07-05 DIAGNOSIS — R972 Elevated prostate specific antigen [PSA]: Secondary | ICD-10-CM | POA: Diagnosis not present

## 2015-07-05 DIAGNOSIS — C61 Malignant neoplasm of prostate: Secondary | ICD-10-CM | POA: Diagnosis not present

## 2015-07-05 DIAGNOSIS — N5201 Erectile dysfunction due to arterial insufficiency: Secondary | ICD-10-CM | POA: Diagnosis not present

## 2015-07-08 DIAGNOSIS — E781 Pure hyperglyceridemia: Secondary | ICD-10-CM | POA: Diagnosis not present

## 2015-07-08 DIAGNOSIS — E119 Type 2 diabetes mellitus without complications: Secondary | ICD-10-CM | POA: Diagnosis not present

## 2015-07-08 DIAGNOSIS — I10 Essential (primary) hypertension: Secondary | ICD-10-CM | POA: Diagnosis not present

## 2015-10-28 DIAGNOSIS — C61 Malignant neoplasm of prostate: Secondary | ICD-10-CM | POA: Diagnosis not present

## 2015-11-01 DIAGNOSIS — Z0000 Encounter for general adult medical examination without abnormal findings: Secondary | ICD-10-CM | POA: Diagnosis not present

## 2015-11-01 DIAGNOSIS — C61 Malignant neoplasm of prostate: Secondary | ICD-10-CM | POA: Diagnosis not present

## 2015-11-28 DIAGNOSIS — N5201 Erectile dysfunction due to arterial insufficiency: Secondary | ICD-10-CM | POA: Diagnosis not present

## 2016-01-29 DIAGNOSIS — E119 Type 2 diabetes mellitus without complications: Secondary | ICD-10-CM | POA: Diagnosis not present

## 2016-01-29 DIAGNOSIS — R51 Headache: Secondary | ICD-10-CM | POA: Diagnosis not present

## 2016-01-29 DIAGNOSIS — E7800 Pure hypercholesterolemia, unspecified: Secondary | ICD-10-CM | POA: Diagnosis not present

## 2016-01-29 DIAGNOSIS — I10 Essential (primary) hypertension: Secondary | ICD-10-CM | POA: Diagnosis not present

## 2016-02-13 DIAGNOSIS — C61 Malignant neoplasm of prostate: Secondary | ICD-10-CM | POA: Diagnosis not present

## 2016-02-18 DIAGNOSIS — E7800 Pure hypercholesterolemia, unspecified: Secondary | ICD-10-CM | POA: Diagnosis not present

## 2016-02-18 DIAGNOSIS — I10 Essential (primary) hypertension: Secondary | ICD-10-CM | POA: Diagnosis not present

## 2016-02-18 DIAGNOSIS — E119 Type 2 diabetes mellitus without complications: Secondary | ICD-10-CM | POA: Diagnosis not present

## 2016-02-20 DIAGNOSIS — C61 Malignant neoplasm of prostate: Secondary | ICD-10-CM | POA: Diagnosis not present

## 2016-03-12 DIAGNOSIS — M542 Cervicalgia: Secondary | ICD-10-CM | POA: Diagnosis not present

## 2016-03-12 DIAGNOSIS — M4722 Other spondylosis with radiculopathy, cervical region: Secondary | ICD-10-CM | POA: Diagnosis not present

## 2016-04-07 DIAGNOSIS — E7800 Pure hypercholesterolemia, unspecified: Secondary | ICD-10-CM | POA: Diagnosis not present

## 2016-04-07 DIAGNOSIS — E1165 Type 2 diabetes mellitus with hyperglycemia: Secondary | ICD-10-CM | POA: Diagnosis not present

## 2016-04-07 DIAGNOSIS — I10 Essential (primary) hypertension: Secondary | ICD-10-CM | POA: Diagnosis not present

## 2016-04-07 DIAGNOSIS — E162 Hypoglycemia, unspecified: Secondary | ICD-10-CM | POA: Diagnosis not present

## 2016-05-13 DIAGNOSIS — E559 Vitamin D deficiency, unspecified: Secondary | ICD-10-CM | POA: Diagnosis not present

## 2016-05-13 DIAGNOSIS — M2550 Pain in unspecified joint: Secondary | ICD-10-CM | POA: Diagnosis not present

## 2016-05-13 DIAGNOSIS — Z0001 Encounter for general adult medical examination with abnormal findings: Secondary | ICD-10-CM | POA: Diagnosis not present

## 2016-05-13 DIAGNOSIS — I10 Essential (primary) hypertension: Secondary | ICD-10-CM | POA: Diagnosis not present

## 2016-05-13 DIAGNOSIS — E119 Type 2 diabetes mellitus without complications: Secondary | ICD-10-CM | POA: Diagnosis not present

## 2016-06-10 DIAGNOSIS — E7800 Pure hypercholesterolemia, unspecified: Secondary | ICD-10-CM | POA: Diagnosis not present

## 2016-06-10 DIAGNOSIS — E1165 Type 2 diabetes mellitus with hyperglycemia: Secondary | ICD-10-CM | POA: Diagnosis not present

## 2016-06-10 DIAGNOSIS — I10 Essential (primary) hypertension: Secondary | ICD-10-CM | POA: Diagnosis not present

## 2016-08-03 DIAGNOSIS — E1165 Type 2 diabetes mellitus with hyperglycemia: Secondary | ICD-10-CM | POA: Diagnosis not present

## 2016-08-03 DIAGNOSIS — E7800 Pure hypercholesterolemia, unspecified: Secondary | ICD-10-CM | POA: Diagnosis not present

## 2016-08-03 DIAGNOSIS — I10 Essential (primary) hypertension: Secondary | ICD-10-CM | POA: Diagnosis not present

## 2016-08-10 DIAGNOSIS — E1165 Type 2 diabetes mellitus with hyperglycemia: Secondary | ICD-10-CM | POA: Diagnosis not present

## 2016-08-10 DIAGNOSIS — E7800 Pure hypercholesterolemia, unspecified: Secondary | ICD-10-CM | POA: Diagnosis not present

## 2016-08-10 DIAGNOSIS — I10 Essential (primary) hypertension: Secondary | ICD-10-CM | POA: Diagnosis not present

## 2016-08-17 DIAGNOSIS — E7800 Pure hypercholesterolemia, unspecified: Secondary | ICD-10-CM | POA: Diagnosis not present

## 2016-08-17 DIAGNOSIS — I10 Essential (primary) hypertension: Secondary | ICD-10-CM | POA: Diagnosis not present

## 2016-08-17 DIAGNOSIS — E1165 Type 2 diabetes mellitus with hyperglycemia: Secondary | ICD-10-CM | POA: Diagnosis not present

## 2017-10-12 ENCOUNTER — Encounter: Payer: Self-pay | Admitting: Endocrinology

## 2017-10-12 ENCOUNTER — Encounter: Payer: Self-pay | Admitting: Physician Assistant

## 2017-10-29 ENCOUNTER — Encounter: Payer: Self-pay | Admitting: Cardiovascular Disease

## 2017-11-15 ENCOUNTER — Ambulatory Visit: Payer: Self-pay | Admitting: Cardiology

## 2017-11-16 DIAGNOSIS — K219 Gastro-esophageal reflux disease without esophagitis: Secondary | ICD-10-CM | POA: Insufficient documentation

## 2017-11-16 DIAGNOSIS — R011 Cardiac murmur, unspecified: Secondary | ICD-10-CM | POA: Insufficient documentation

## 2017-11-24 ENCOUNTER — Ambulatory Visit: Payer: Self-pay | Admitting: Physician Assistant

## 2017-12-31 ENCOUNTER — Encounter: Payer: Self-pay | Admitting: Physician Assistant

## 2017-12-31 NOTE — Progress Notes (Signed)
Cardiology Office Note    Date:  01/03/2018  ID:  Seth Guzman, Seth Guzman [birth year], MRN [id number] PCP:  [name], MD  Cardiologist:  Dr. Angelena Form   Chief Complaint: f/u CAD  History of Present Illness:  Seth Guzman is a 72 y.o. male with history of CAD (cath 2016 managed medically), HTN, HLD, DM, seizures, prostate CA, arthritis, aniety, depression, gout, GERD, ICM/cardiomyopathy EF 45-50% who presents for overdue follow-up.  Remote cath in 2006 showed moderate CAD. He was seen 09/24/14 after a 5 year absence in our office. He had chest pain at that time with an abnormal stress test (fixed defect without ischemia). He initially declined cath then proceeded 12/2014 showing 20% prox-mid RCA, 30% ost-prox LAD, 30% mLAd, 99% distal LAD-2, 90% distal LAD-1, 50% D2, 70% D3 -> the LAD disease was small caliber/apical and too small for PCI. EF was 45-50%. Medical therapy was recommended. He's not been seen since 2016. He has no prior echo on file. Last labs 10/2017 scanned in showed glucose 99, Cr 0.9, Na 139, K 4.0, LDL 117, Trig 130, HDL 40,  normal LFTs.  He returns for overdue follow-up today. Overall he feels he's been doing well. He does continue to report exertional chest discomfort/dyspnea that happens about twice a month when he pushes himself up an incline walking fast. Sometimes he uses NTG with relief. It resolves fairly quickly with rest. He does not have any chest pain or dyspnea at rest. He feels this is stable since 2016. He has somewhat of a stoic/reserved affect. No lower extremity edema, palpitations, syncope or LEE.    Past Medical History:  Diagnosis Date  . Anxiety   . Arthritis   . CAD in native artery    a. cath 2006 - mod CAD, med rx. b. cath 2016 -  20% prox-mid RCA, 30% ost-prox LAD, 30% mLAd, 99% distal LAD-2, 90% distal LAD-1, 50% D2, 70% D3 -> the LAD disease was small caliber/apical and too small for PCI. EF was 45-50%  . Depression   . Diabetes mellitus type 2 in  nonobese (HCC)   . GERD (gastroesophageal reflux disease)   . Gout   . Heart murmur   . HTN (hypertension)   . Hx of radiation therapy 01/08/14- 03/01/14   prostate 7600 cGy 38 sessions  . Hyperlipidemia   . Inguinal hernia    recurrent right inguinal hernia  . Ischemic cardiomyopathy    a. EF 45-50% by cath 2016.  . Meatal stenosis 1966, 1967  . Prostate cancer ([place]) 08/10/13   Gleason 6, volume 28.67 cc  . Seizures ([place])     Past Surgical History:  Procedure Laterality Date  . CARDIAC CATHETERIZATION N/A 01/14/2015   Procedure: Left Heart Cath and Coronary Angiography;  Surgeon: Burnell Blanks, MD;  Location: [facility];  Service: Cardiovascular;  Laterality: N/A;  . HAND SURGERY    . [place]   bilateral herniorrphoes  . PROSTATE BIOPSY  08/10/13   gleason 3+3=6, vol 28.67 cc    Current Medications:  Current Outpatient Medications:  .  amLODipine (NORVASC) 5 MG tablet, Take 5 mg by mouth daily., Disp: , Rfl:  .  aspirin EC 81 MG tablet, Take 81 mg by mouth daily., Disp: , Rfl:  .  carvedilol (COREG) 6.25 MG tablet, Take 6.25 mg by mouth 2 (two) times daily with a meal. , Disp: , Rfl:  .  fish oil-omega-3 fatty acids 1000  MG capsule, Take 1 g by mouth daily. , Disp: , Rfl:  .  glimepiride (AMARYL) 4 MG tablet, Take 4 mg by mouth 2 (two) times daily. , Disp: , Rfl:  .  JANUMET 50-500 MG per tablet, Take 1 tablet by mouth daily., Disp: , Rfl: 0 .  Multiple Vitamins-Minerals (MULTIVITAMIN WITH MINERALS) tablet, Take 1 tablet by mouth daily., Disp: , Rfl:  .  naproxen sodium (ANAPROX) 220 MG tablet, Take 440 mg by mouth 2 (two) times daily as needed (back pain)., Disp: , Rfl:  .  nitroGLYCERIN (NITROSTAT) 0.4 MG SL tablet, Place 1 tablet (0.4 mg total) under the tongue every 5 (five) minutes as needed for chest pain., Disp: 25 tablet, Rfl: 6 .  rosuvastatin (CRESTOR) 5 MG tablet, Take 5 mg by mouth daily., Disp: , Rfl: 1 .  isosorbide  mononitrate (IMDUR) 30 MG 24 hr tablet, Take 1 tablet (30 mg total) by mouth daily., Disp: 90 tablet, Rfl: 3  Allergies:   Other and Vicodin [hydrocodone-acetaminophen]   Social History   Socioeconomic History  . Marital status: Married    Spouse name: Not on file  . Number of children: Not on file  . Years of education: Not on file  . Highest education level: Not on file  Occupational History  . Not on file  Social Needs  . Financial resource strain: Not on file  . Food insecurity:    Worry: Not on file    Inability: Not on file  . Transportation needs:    Medical: Not on file    Non-medical: Not on file  Tobacco Use  . Smoking status: Never Smoker  . Smokeless tobacco: Never Used  Substance and Sexual Activity  . Alcohol use: Yes    Comment: ocasionally  . Drug use: No  . Sexual activity: Not on file  Lifestyle  . Physical activity:    Days per week: Not on file    Minutes per session: Not on file  . Stress: Not on file  Relationships  . Social connections:    Talks on phone: Not on file    Gets together: Not on file    Attends religious service: Not on file    Active member of club or organization: Not on file    Attends meetings of clubs or organizations: Not on file    Relationship status: Not on file  Other Topics Concern  . Not on file  Social History Narrative  . Not on file     Family History:  Family History  Problem Relation Age of Onset  . Diabetes Mother   . Stroke Mother   . Diabetes Sister   . Heart attack Sister   . Diabetes Brother   . Cancer Father        larynx  . Stroke Brother     ROS:   Please see the history of present illness. All other systems are reviewed and otherwise negative.    PHYSICAL EXAM:   VS:  BP 138/80 (BP Location: Right Arm, Patient Position: Sitting, Cuff Size: Normal)   Pulse 71   Ht 6' (1.829 m)   Wt 174 lb 3.2 oz (79 kg)   SpO2 98%   BMI 23.63 kg/m   BMI: Body mass index is 23.63 kg/m. GEN: Well  nourished, well developed AAM, in no acute distress  HEENT: normocephalic, atraumatic Neck: no JVD, carotid bruits, or masses Cardiac: RRR; no murmurs, rubs, or gallops, no edema  Respiratory:  clear to auscultation bilaterally, normal work of breathing GI: soft, nontender, nondistended, + BS MS: no deformity or atrophy  Skin: warm and dry, no rash Neuro:  Alert and Oriented x 3, Strength and sensation are intact, follows commands Psych: euthymic mood, reserved affect  Wt Readings from Last 3 Encounters:  01/03/18 174 lb 3.2 oz (79 kg)  02/18/15 175 lb 12.8 oz (79.7 kg)  01/14/15 175 lb (79.4 kg)      Studies/Labs Reviewed:   EKG:  EKG was ordered today and personally reviewed by me and demonstrates NSR 75bpm with 1st degree AVB otherwise nonacute.  Recent Labs: No results found for requested labs within last 8760 hours.   Lipid Panel    Component Value Date/Time   CHOL 167 03/18/2009 0902   TRIG 52.0 03/18/2009 0902   HDL 57.10 03/18/2009 0902   CHOLHDL 3 03/18/2009 0902   VLDL 10.4 03/18/2009 0902   LDLCALC 100 (H) 03/18/2009 0902   LDLDIRECT 148.4 12/28/2007 1545    Additional studies/ records that were reviewed today include: Summarized above   ASSESSMENT & PLAN:   1. CAD - with chronic stable angina. He denies any change since 2016. Will continue current regimen but increase Imdur to 60mg  daily. We discussed titration of medical therapy versus updated ischemic stress testing but he wishes to start with med adjustment first. No recent CBC on file - will update given his ASA use. See below regarding statin. ER precautions reviewed. Also instructed to limit NSAID use given his coronary history and ASA use. 2. Cardiomyopathy - no signs of CHF. It's not clear to me why he is not on an ACEI/ARB if he is diabetic, but this can be considered at next visit if BP will allow with addition of nitrate. Continue carvedilol. 3. HTN - BP is slightly above goal. See above re:  addition of Imdur, as well as consideration in the future of ACEI/ARB. He does not have a way to check his blood pressure at home. 4. Hyperlipidemia - he reports a statin was just started in February. He denies any specific reason why this is a lower dose. At some point he was on atorvastatin 20mg  in the past but is now on Crestor 5mg . Will check CMET/lipids today with anticipation of escalation of statin per guidelines if these are stable.  Disposition: F/u with Dr. McAlhany/care team APP in 6 weeks.   Medication Adjustments/Labs and Tests Ordered: Current medicines are reviewed at length with the patient today.  Concerns regarding medicines are outlined above. Medication changes, Labs and Tests ordered today are summarized above and listed in the Patient Instructions accessible in Encounters.   Signed, Charlie Pitter, PA-C  01/03/2018 11:24 AM    [facility] [address], [place], [place]  [address] Phone: [phone]; Fax: [phone]

## 2018-01-03 ENCOUNTER — Encounter: Payer: Self-pay | Admitting: Physician Assistant

## 2018-01-03 ENCOUNTER — Ambulatory Visit: Payer: Medicare Other | Admitting: Physician Assistant

## 2018-01-03 VITALS — BP 138/80 | HR 71 | Ht 72.0 in | Wt 174.2 lb

## 2018-01-03 DIAGNOSIS — E785 Hyperlipidemia, unspecified: Secondary | ICD-10-CM

## 2018-01-03 DIAGNOSIS — I25118 Atherosclerotic heart disease of native coronary artery with other forms of angina pectoris: Secondary | ICD-10-CM

## 2018-01-03 DIAGNOSIS — I10 Essential (primary) hypertension: Secondary | ICD-10-CM

## 2018-01-03 DIAGNOSIS — I255 Ischemic cardiomyopathy: Secondary | ICD-10-CM

## 2018-01-03 LAB — BASIC METABOLIC PANEL
BUN / CREAT RATIO: 12 (ref 10–24)
BUN: 10 mg/dL (ref 8–27)
CHLORIDE: 101 mmol/L (ref 96–106)
CO2: 22 mmol/L (ref 20–29)
Calcium: 9.5 mg/dL (ref 8.6–10.2)
Creatinine, Ser: 0.83 mg/dL (ref 0.76–1.27)
GFR calc non Af Amer: 87 mL/min/{1.73_m2} (ref >59)
GFR, EST AFRICAN AMERICAN: 101 mL/min/{1.73_m2} (ref >59)
Glucose: 143 mg/dL — ABNORMAL HIGH (ref 65–99)
Potassium: 4.3 mmol/L (ref 3.5–5.2)
Sodium: 138 mmol/L (ref 134–144)

## 2018-01-03 LAB — HEPATIC FUNCTION PANEL
ALK PHOS: 68 IU/L (ref 39–117)
ALT: 18 IU/L (ref 0–44)
AST: 16 IU/L (ref 0–40)
Albumin: 4.6 g/dL (ref 3.5–4.8)
BILIRUBIN, DIRECT: 0.16 mg/dL (ref 0.00–0.40)
Bilirubin Total: 0.4 mg/dL (ref 0.0–1.2)
TOTAL PROTEIN: 6.7 g/dL (ref 6.0–8.5)

## 2018-01-03 LAB — CBC
Hematocrit: 39.7 % (ref 37.5–51.0)
Hemoglobin: 13.4 g/dL (ref 13.0–17.7)
MCH: 27.7 pg (ref 26.6–33.0)
MCHC: 33.8 g/dL (ref 31.5–35.7)
MCV: 82 fL (ref 79–97)
Platelets: 261 10*3/uL (ref 150–379)
RBC: 4.83 x10E6/uL (ref 4.14–5.80)
RDW: 14.4 % (ref 12.3–15.4)
WBC: 8.2 10*3/uL (ref 3.4–10.8)

## 2018-01-03 LAB — LIPID PANEL
Chol/HDL Ratio: 2.3 ratio (ref 0.0–5.0)
Cholesterol, Total: 149 mg/dL (ref 100–199)
HDL: 66 mg/dL (ref >39)
LDL Calculated: 71 mg/dL (ref 0–99)
Triglycerides: 60 mg/dL (ref 0–149)
VLDL Cholesterol Cal: 12 mg/dL (ref 5–40)

## 2018-01-03 MED ORDER — ISOSORBIDE MONONITRATE ER 60 MG PO TB24: 60.0000 mg | ORAL_TABLET | Freq: Every day | ORAL | 3 refills | Status: DC

## 2018-01-03 NOTE — Addendum Note (Signed)
Addended by: Stanton Kidney on: 01/03/2018 05:54 PM   Modules accepted: Orders

## 2018-01-03 NOTE — Patient Instructions (Addendum)
Medication Instructions:  Your physician has recommended you make the following change in your medication:  1. INCREASE Imdur to 60 mg once daily  Labwork: Today: Lipid profile, CBC, BMET & LFTs   Testing/Procedures: None ordered  Follow-Up: Your physician recommends that you schedule a follow-up appointment in: 6 weeks with Melina Copa, PA-C.    * If you need a refill on your cardiac medications before your next appointment, please call your pharmacy.   *Please note that any paperwork needing to be filled out by the provider will need to be addressed at the front desk prior to seeing the provider. Please note that any FMLA, disability or other documents regarding health condition is subject to a $25.00 charge that must be received prior to completion of paperwork in the form of a money order or check.  Thank you for choosing [facility]!!    Additional Instructions: With your heart history - please limit your use of over the counter Aleve medication.

## 2018-01-10 ENCOUNTER — Telehealth: Payer: Self-pay | Admitting: Physician Assistant

## 2018-01-10 NOTE — Telephone Encounter (Signed)
New Message ° ° ° °Patient is returning call in reference to lab results. Please call.  °

## 2018-01-10 NOTE — Telephone Encounter (Signed)
-----   Message from Charlie Pitter, [place] sent at 01/03/2018  4:13 PM EDT ----- Please let patient know labs were normal except blood sugar elevated consistent with DM. Can you call PCP office to find out if there is a specific reason why they switched the patient to such a low dose of Crestor? He was on atorvastatin 20mg  in the past. The patient was unaware of any issues with this. Given his DM and CAD, would recommend at least Crestor 20mg  daily with f/u LFTs/lipids in 6 weeks.   Dayna Dunn PA-C

## 2018-01-10 NOTE — Telephone Encounter (Signed)
Returned pts call and left a message for him to call back. 

## 2018-01-12 NOTE — Telephone Encounter (Signed)
-----   Message from Charlie Pitter, [place] sent at 01/03/2018  4:13 PM EDT ----- Please let patient know labs were normal except blood sugar elevated consistent with DM. Can you call PCP office to find out if there is a specific reason why they switched the patient to such a low dose of Crestor? He was on atorvastatin 20mg  in the past. The patient was unaware of any issues with this. Given his DM and CAD, would recommend at least Crestor 20mg  daily with f/u LFTs/lipids in 6 weeks.   Dayna Dunn PA-C

## 2018-01-12 NOTE — Telephone Encounter (Signed)
Returned pts call and he has been made aware of his lab results. See result note. 

## 2018-01-12 NOTE — Telephone Encounter (Signed)
Follow up  ° ° °Patient is returning call in reference to lab results. Please call  °

## 2018-01-12 NOTE — Progress Notes (Signed)
Pt has been made aware of normal result and verbalized understanding.  jw  01/12/18

## 2018-02-18 NOTE — Progress Notes (Addendum)
Cardiology Office Note    Date:  02/22/2018  ID:  Seth Guzman, Seth Guzman November 25, 1945, MRN [id number] PCP:  [name], MD  Cardiologist:  Lauree Chandler, MD   Chief Complaint: f/u chest pain  History of Present Illness:  Seth Guzman is a 72 y.o. male with history of CAD (cath 2016 managed medically), HTN, HLD, DM, seizures, prostate CA, arthritis, aniety, depression, gout, GERD, ICM/cardiomyopathy EF 45-50% who presents for f/u of angina.  Remote cath in 2006 showed moderate CAD. He was seen 09/24/14 after a 5 year absence in our office. He had chest pain at that time with an abnormal stress test (fixed defect without ischemia). He initially declined cath then proceeded 12/2014 showing 20% prox-mid RCA, 30% ost-prox LAD, 30% mLAd, 99% distal LAD-2, 90% distal LAD-1, 50% D2, 70% D3 -> the LAD disease was small caliber/apical and too small for PCI. EF was 45-50%. Medical therapy was recommended. Imdur was added per 02/18/15 OV (but see below).  He returned to clinic 12/2017 for overdue follow-up. Overall he felt he was doing well. He did continue to report exertional chest discomfort/dyspnea that would happen about twice a month when he pushes himself up an incline walking fast. Sometimes he uses NTG with relief. It resolves fairly quickly with rest. He felt this was stable since 2016. Labs 01/03/18 showed normal LFTs, CBC, Cr 0.83, LDL 71. He had recently been switched to Crestor 5mg  which we recommended to increase to 20mg  with plan for f/u labs in 6 weeks. We discussed titration of Imdur to 60mg  daily and he was agreeable.  He returns for follow-up today overall feeling well. He continues to have somewhat of a reserved, polite affect. He reveals that he did not start the increased dose of Imdur because he had actually not been even taking the 30mg  daily because it made his heart pound. He also did not start the increased dose of Crestor 20mg  for unclear reasons. Our office result note indicates  this was to be sent in but I do not see this actually occurred. Nevertheless the patient states he actually prefers to keep this at 5mg  daily because he already feels he is taking too many medications. He actually denies any further chest pain recently and feels DOE has also been well controlled.    Past Medical History:  Diagnosis Date  . Anxiety   . Arthritis   . CAD in native artery    a. cath 2006 - mod CAD, med rx. b. cath 2016 -  20% prox-mid RCA, 30% ost-prox LAD, 30% mLAd, 99% distal LAD-2, 90% distal LAD-1, 50% D2, 70% D3 -> the LAD disease was small caliber/apical and too small for PCI. EF was 45-50%  . Depression   . Diabetes mellitus type 2 in nonobese (HCC)   . GERD (gastroesophageal reflux disease)   . Gout   . Heart murmur   . HTN (hypertension)   . Hx of radiation therapy 01/08/14- 03/01/14   prostate 7600 cGy 38 sessions  . Hyperlipidemia   . Inguinal hernia    recurrent right inguinal hernia  . Ischemic cardiomyopathy    a. EF 45-50% by cath 2016.  . Meatal stenosis 1966, 1967  . Prostate cancer ([place]) 08/10/13   Gleason 6, volume 28.67 cc  . Seizures ([place])     Past Surgical History:  Procedure Laterality Date  . CARDIAC CATHETERIZATION N/A 01/14/2015   Procedure: Left Heart Cath and Coronary Angiography;  Surgeon: Burnell Blanks, MD;  Location: [facility];  Service: Cardiovascular;  Laterality: N/A;  . HAND SURGERY    . [place]   bilateral herniorrphoes  . PROSTATE BIOPSY  08/10/13   gleason 3+3=6, vol 28.67 cc    Current Medications: Current Meds  Medication Sig  . amLODipine (NORVASC) 5 MG tablet Take 5 mg by mouth daily.  Marland Kitchen aspirin EC 81 MG tablet Take 81 mg by mouth daily.  . carvedilol (COREG) 6.25 MG tablet Take 6.25 mg by mouth 2 (two) times daily with a meal.   . Coenzyme Q10 200 MG capsule Take 200 mg by mouth daily.  . fish oil-omega-3 fatty acids 1000 MG capsule Take 1 g by mouth daily.   Marland Kitchen glimepiride  (AMARYL) 4 MG tablet Take 4 mg by mouth 2 (two) times daily.   . isosorbide mononitrate (IMDUR) 60 MG 24 hr tablet Take 1 tablet (60 mg total) by mouth daily. - patient is not taking   . JANUMET 50-500 MG per tablet Take 1 tablet by mouth daily.  . Multiple Vitamins-Minerals (MULTIVITAMIN WITH MINERALS) tablet Take 1 tablet by mouth daily.  . naproxen sodium (ANAPROX) 220 MG tablet Take 440 mg by mouth 2 (two) times daily as needed (back pain).  . nitroGLYCERIN (NITROSTAT) 0.4 MG SL tablet Place 1 tablet (0.4 mg total) under the tongue every 5 (five) minutes as needed for chest pain.  . rosuvastatin (CRESTOR) 5 MG tablet Take 5 mg by mouth daily.  . vitamin B-12 (CYANOCOBALAMIN) 1000 MCG tablet Take 1,000 mcg by mouth daily.   Allergies:   Other and Vicodin [hydrocodone-acetaminophen]   Social History   Socioeconomic History  . Marital status: Married    Spouse name: Not on file  . Number of children: Not on file  . Years of education: Not on file  . Highest education level: Not on file  Occupational History  . Not on file  Social Needs  . Financial resource strain: Not on file  . Food insecurity:    Worry: Not on file    Inability: Not on file  . Transportation needs:    Medical: Not on file    Non-medical: Not on file  Tobacco Use  . Smoking status: Never Smoker  . Smokeless tobacco: Never Used  Substance and Sexual Activity  . Alcohol use: Yes    Comment: ocasionally  . Drug use: No  . Sexual activity: Not on file  Lifestyle  . Physical activity:    Days per week: Not on file    Minutes per session: Not on file  . Stress: Not on file  Relationships  . Social connections:    Talks on phone: Not on file    Gets together: Not on file    Attends religious service: Not on file    Active member of club or organization: Not on file    Attends meetings of clubs or organizations: Not on file    Relationship status: Not on file  Other Topics Concern  . Not on file  Social  History Narrative  . Not on file     Family History:  The patient's family history includes Cancer in his father; Diabetes in his brother, mother, and sister; Heart attack in his sister; Stroke in his brother and mother.  ROS:   Please see the history of present illness.  All other systems are reviewed and otherwise negative.    PHYSICAL EXAM:   VS:  BP 118/60  Pulse 68   Ht 6' (1.829 m)   Wt 168 lb (76.2 kg)   SpO2 95%   BMI 22.78 kg/m   BMI: Body mass index is 22.78 kg/m. GEN: Well nourished, well developed M, in no acute distress HEENT: normocephalic, atraumatic Neck: no JVD, carotid bruits, or masses Cardiac: RRR; no murmurs, rubs, or gallops, no edema  Respiratory:  clear to auscultation bilaterally, normal work of breathing GI: soft, nontender, nondistended, + BS MS: no deformity or atrophy Skin: warm and dry, no rash Neuro:  Alert and Oriented x 3, Strength and sensation are intact, follows commands Psych: euthymic mood, full affect  Wt Readings from Last 3 Encounters:  02/22/18 168 lb (76.2 kg)  01/03/18 174 lb 3.2 oz (79 kg)  02/18/15 175 lb 12.8 oz (79.7 kg)      Studies/Labs Reviewed:   EKG:  EKG was not ordered today  Recent Labs: 01/03/2018: ALT 18; BUN 10; Creatinine, Ser 0.83; Hemoglobin 13.4; Platelets 261; Potassium 4.3; Sodium 138   Lipid Panel    Component Value Date/Time   CHOL 149 01/03/2018 1151   TRIG 60 01/03/2018 1151   HDL 66 01/03/2018 1151   CHOLHDL 2.3 01/03/2018 1151   CHOLHDL 3 03/18/2009 0902   VLDL 10.4 03/18/2009 0902   LDLCALC 71 01/03/2018 1151   LDLDIRECT 148.4 12/28/2007 1545    Additional studies/ records that were reviewed today include: Summarized above.    ASSESSMENT & PLAN:   1. CAD - last visit I recommended to titrate Imdur in attempts to eradicate his chronic stable angina but he actually revealed today he had never taken the 30mg  dose thus had never increased to 60mg  either. Regardless today he actually  states he's feeling "just fine" the last several weeks without any breakthrough symptoms and does not feel the need to change any of his regimen. Will continue present regimen otherwise as outlined. We did discuss importance of notifying our office for any escalating symptoms, as there would be an opportunity to titrate either amlodipine or carvedilol if needed. He assured me he will keep [place] apprised. 2. Cardiomyopathy - appears euvolemic. Not clear why he is not on ACEI/ARB given diabetes but will defer to primary care with whom he also follows closely. As below he prefers a simplified regimen. 3. HTN - controlled. 4. Hyperlipidemia - his PCP recently added Crestor 5mg  daily. As above, I had initially recommended titration in Crestor but it's not clear why this did not occur. I have gotten the impression Mr. Petrow prefers a less complicated medication regimen and he affirms this today, citing he does not wish to change the Crestor at present time. This can be revisited in the future if he changes his mind.  Disposition: F/u with Dr. Angelena Form in 6 months.   Medication Adjustments/Labs and Tests Ordered: Current medicines are reviewed at length with the patient today.  Concerns regarding medicines are outlined above. Medication changes, Labs and Tests ordered today are summarized above and listed in the Patient Instructions accessible in Encounters.   Signed, Charlie Pitter, PA-C  02/22/2018 10:07 AM    [facility] [address], [place], [place]  [address] Phone: [phone]; Fax: [phone]

## 2018-02-22 ENCOUNTER — Ambulatory Visit: Payer: Medicare Other | Admitting: Physician Assistant

## 2018-02-22 ENCOUNTER — Encounter: Payer: Self-pay | Admitting: Physician Assistant

## 2018-02-22 ENCOUNTER — Encounter (INDEPENDENT_AMBULATORY_CARE_PROVIDER_SITE_OTHER): Payer: Self-pay

## 2018-02-22 VITALS — BP 118/60 | HR 68 | Ht 72.0 in | Wt 168.0 lb

## 2018-02-22 DIAGNOSIS — I2510 Atherosclerotic heart disease of native coronary artery without angina pectoris: Secondary | ICD-10-CM | POA: Diagnosis not present

## 2018-02-22 DIAGNOSIS — I10 Essential (primary) hypertension: Secondary | ICD-10-CM | POA: Diagnosis not present

## 2018-02-22 DIAGNOSIS — E785 Hyperlipidemia, unspecified: Secondary | ICD-10-CM | POA: Diagnosis not present

## 2018-02-22 DIAGNOSIS — I429 Cardiomyopathy, unspecified: Secondary | ICD-10-CM

## 2018-02-22 NOTE — Patient Instructions (Signed)
Medication Instructions:  Your physician has recommended you make the following change in your medication: 1.  STOP the Imdur   Labwork: None ordered  Testing/Procedures: None ordered  Follow-Up: Your physician recommends that you schedule a follow-up appointment in: 08/15/18 WITH DR. Angelena Form.  ARRIVE AT 10:45 A.M.   Any Other Special Instructions Will Be Listed Below (If Applicable).     If you need a refill on your cardiac medications before your next appointment, please call your pharmacy.

## 2018-08-15 ENCOUNTER — Ambulatory Visit: Payer: Medicare Other | Admitting: Cardiovascular Disease

## 2018-08-15 ENCOUNTER — Encounter (INDEPENDENT_AMBULATORY_CARE_PROVIDER_SITE_OTHER): Payer: Self-pay

## 2018-08-15 ENCOUNTER — Encounter: Payer: Self-pay | Admitting: Cardiovascular Disease

## 2018-08-15 VITALS — BP 138/60 | HR 71 | Ht 72.0 in | Wt 175.4 lb

## 2018-08-15 DIAGNOSIS — I10 Essential (primary) hypertension: Secondary | ICD-10-CM | POA: Diagnosis not present

## 2018-08-15 DIAGNOSIS — E785 Hyperlipidemia, unspecified: Secondary | ICD-10-CM

## 2018-08-15 DIAGNOSIS — I25118 Atherosclerotic heart disease of native coronary artery with other forms of angina pectoris: Secondary | ICD-10-CM | POA: Diagnosis not present

## 2018-08-15 NOTE — Progress Notes (Signed)
Chief Complaint  Patient presents with  . Follow-up    CAD   History of Present Illness: 72 yo male with history of diabetes mellitus, hypertension, hyperlipidemia, prostate cancer, seizures, arthritis, anxiety/depression, gout, ischemic cardiomyopathy and coronary artery disease who is here today for cardiac follow up. Cardiac cath in 2006 with moderate non-obstructive CAD. Stress myoview 09/28/14 was an intermediate risk study with fixed defect and normal wall motion in that area, LVEF=44%. No clear ischemia. I saw him in February 2016 and he refused to consider a cardiac cath but reconsidered. Cardiac cath May 2016 with severe disease in the distal LAD but vessel too small for PCI. LVEF=45-50%. Imdur was added but he did not start.   He is here today for follow up. The patient denies any dyspnea, palpitations, lower extremity edema, orthopnea, PND, dizziness, near syncope or syncope. He had an episode of chest burning several weeks ago. No associated dyspnea.   Primary Care Physician: [name], MD  Past Medical History:  Diagnosis Date  . Anxiety   . Arthritis   . CAD in native artery    a. cath 2006 - mod CAD, med rx. b. cath 2016 -  20% prox-mid RCA, 30% ost-prox LAD, 30% mLAd, 99% distal LAD-2, 90% distal LAD-1, 50% D2, 70% D3 -> the LAD disease was small caliber/apical and too small for PCI. EF was 45-50%  . Depression   . Diabetes mellitus type 2 in nonobese (HCC)   . GERD (gastroesophageal reflux disease)   . Gout   . Heart murmur   . HTN (hypertension)   . Hx of radiation therapy 01/08/14- 03/01/14   prostate 7600 cGy 38 sessions  . Hyperlipidemia   . Inguinal hernia    recurrent right inguinal hernia  . Ischemic cardiomyopathy    a. EF 45-50% by cath 2016.  . Meatal stenosis 1966, 1967  . Prostate cancer ([place]) 08/10/13   Gleason 6, volume 28.67 cc  . Seizures ([place])     Past Surgical History:  Procedure Laterality Date  . CARDIAC CATHETERIZATION N/A 01/14/2015     Procedure: Left Heart Cath and Coronary Angiography;  Surgeon: Burnell Blanks, MD;  Location: [facility];  Service: Cardiovascular;  Laterality: N/A;  . HAND SURGERY    . [place]   bilateral herniorrphoes  . PROSTATE BIOPSY  08/10/13   gleason 3+3=6, vol 28.67 cc    Current Outpatient Medications  Medication Sig Dispense Refill  . amLODipine (NORVASC) 5 MG tablet Take 5 mg by mouth daily.    Marland Kitchen aspirin EC 81 MG tablet Take 81 mg by mouth daily.    . carvedilol (COREG) 6.25 MG tablet Take 6.25 mg by mouth 2 (two) times daily with a meal.     . Coenzyme Q10 200 MG capsule Take 200 mg by mouth daily.    . fish oil-omega-3 fatty acids 1000 MG capsule Take 1 g by mouth daily.     Marland Kitchen glimepiride (AMARYL) 4 MG tablet Take 4 mg by mouth 2 (two) times daily.     Marland Kitchen JANUMET 50-500 MG per tablet Take 1 tablet by mouth daily.  0  . Multiple Vitamins-Minerals (MULTIVITAMIN WITH MINERALS) tablet Take 1 tablet by mouth daily.    . naproxen sodium (ANAPROX) 220 MG tablet Take 440 mg by mouth 2 (two) times daily as needed (back pain).    . nitroGLYCERIN (NITROSTAT) 0.4 MG SL tablet Place 1 tablet (0.4 mg total)  under the tongue every 5 (five) minutes as needed for chest pain. 25 tablet 6  . rosuvastatin (CRESTOR) 5 MG tablet Take 5 mg by mouth daily.  1  . vitamin B-12 (CYANOCOBALAMIN) 1000 MCG tablet Take 1,000 mcg by mouth daily.     No current facility-administered medications for this visit.     Allergies  Allergen Reactions  . Other Other (See Comments)    Felt dizzy & sick with one pain medication, name unknown by pt.   . Vicodin [Hydrocodone-Acetaminophen] Other (See Comments)    dizzy and sick feeling/Knocks him out    Social History   Socioeconomic History  . Marital status: Married    Spouse name: Not on file  . Number of children: Not on file  . Years of education: Not on file  . Highest education level: Not on file  Occupational History   . Not on file  Social Needs  . Financial resource strain: Not on file  . Food insecurity:    Worry: Not on file    Inability: Not on file  . Transportation needs:    Medical: Not on file    Non-medical: Not on file  Tobacco Use  . Smoking status: Never Smoker  . Smokeless tobacco: Never Used  Substance and Sexual Activity  . Alcohol use: Yes    Comment: ocasionally  . Drug use: No  . Sexual activity: Not on file  Lifestyle  . Physical activity:    Days per week: Not on file    Minutes per session: Not on file  . Stress: Not on file  Relationships  . Social connections:    Talks on phone: Not on file    Gets together: Not on file    Attends religious service: Not on file    Active member of club or organization: Not on file    Attends meetings of clubs or organizations: Not on file    Relationship status: Not on file  . Intimate partner violence:    Fear of current or ex partner: Not on file    Emotionally abused: Not on file    Physically abused: Not on file    Forced sexual activity: Not on file  Other Topics Concern  . Not on file  Social History Narrative  . Not on file    Family History  Problem Relation Age of Onset  . Diabetes Mother   . Stroke Mother   . Diabetes Sister   . Heart attack Sister   . Diabetes Brother   . Cancer Father        larynx  . Stroke Brother     Review of Systems:  As stated in the HPI and otherwise negative.   BP 138/60   Pulse 71   Ht 6' (1.829 m)   Wt 175 lb 6.4 oz (79.6 kg)   SpO2 97%   BMI 23.79 kg/m   Physical Examination: General: Well developed, well nourished, NAD  HEENT: OP clear, mucus membranes moist  SKIN: warm, dry. No rashes. Neuro: No focal deficits  Musculoskeletal: Muscle strength 5/5 all ext  Psychiatric: Mood and affect normal  Neck: No JVD, no carotid bruits, no thyromegaly, no lymphadenopathy.  Lungs:Clear bilaterally, no wheezes, rhonci, crackles Cardiovascular: Regular rate and rhythm. No  murmurs, gallops or rubs. Abdomen:Soft. Bowel sounds present. Non-tender.  Extremities: No lower extremity edema. Pulses are 2 + in the bilateral DP/PT.   EKG:  EKG is not ordered today. The ekg ordered  today demonstrates   Recent Labs: 01/03/2018: ALT 18; BUN 10; Creatinine, Ser 0.83; Hemoglobin 13.4; Platelets 261; Potassium 4.3; Sodium 138   Lipid Panel    Component Value Date/Time   CHOL 149 01/03/2018 1151   TRIG 60 01/03/2018 1151   HDL 66 01/03/2018 1151   CHOLHDL 2.3 01/03/2018 1151   CHOLHDL 3 03/18/2009 0902   VLDL 10.4 03/18/2009 0902   LDLCALC 71 01/03/2018 1151   LDLDIRECT 148.4 12/28/2007 1545     Wt Readings from Last 3 Encounters:  08/15/18 175 lb 6.4 oz (79.6 kg)  02/22/18 168 lb (76.2 kg)  01/03/18 174 lb 3.2 oz (79 kg)     Other studies Reviewed: Additional studies/ records that were reviewed today include: . Review of the above records demonstrates:    Assessment and Plan:   1. CAD with stable angina: Rare chest pains. Continue ASA, Norvasc, statin and beta blocker.     2. HTN: BP is well controlled.  3. HLD: Lipids are followed in primary care. LDL at goal. Continue statin  Current medicines are reviewed at length with the patient today.  The patient does not have concerns regarding medicines.  The following changes have been made:  no change  Labs/ tests ordered today include:   No orders of the defined types were placed in this encounter.   Disposition:   FU with me after cath in 3-4 weeks.   Signed, Lauree Chandler, MD 08/15/2018 12:05 PM    [address], [place], [place]  [address] Phone: [phone]; Fax: [phone]

## 2018-08-15 NOTE — Patient Instructions (Signed)

## 2019-04-10 ENCOUNTER — Other Ambulatory Visit (HOSPITAL_COMMUNITY): Payer: Self-pay | Admitting: Endocrinology

## 2019-04-10 DIAGNOSIS — G459 Transient cerebral ischemic attack, unspecified: Secondary | ICD-10-CM

## 2019-04-12 ENCOUNTER — Ambulatory Visit (HOSPITAL_COMMUNITY)
Admission: RE | Admit: 2019-04-12 | Discharge: 2019-04-12 | Disposition: A | Discharge status: Home / Self Care | Payer: Medicare Other | Source: Ambulatory Visit | Attending: Family | Admitting: Family

## 2019-04-12 ENCOUNTER — Other Ambulatory Visit: Payer: Self-pay

## 2019-04-12 DIAGNOSIS — G459 Transient cerebral ischemic attack, unspecified: Secondary | ICD-10-CM | POA: Diagnosis present

## 2019-07-24 ENCOUNTER — Telehealth: Payer: Self-pay | Admitting: Cardiovascular Disease

## 2019-07-24 NOTE — Telephone Encounter (Signed)
New Message:  Pt has an urgent referral to see Dr. Angelena Form for chest pain from Dr. Forde Dandy. Pt is already established with Dr. Angelena Form. Left message on cell phone to have patient set up follow-up appt. Attempted to contact patient on other phone number but the line was busy

## 2019-07-25 ENCOUNTER — Telehealth: Payer: Self-pay | Admitting: Cardiovascular Disease

## 2019-07-25 NOTE — Telephone Encounter (Signed)
Send message to Dr. Camillia Herter scheduler to try to reach patient. Will also send call to triage to try to reach patient at a later time, or in case he calls back to discuss symptoms.

## 2019-07-25 NOTE — Telephone Encounter (Signed)
° ° °  Pt c/o Shortness Of Breath: STAT if SOB developed within the last 24 hours or pt is noticeably SOB on the phone  1. Are you currently SOB (can you hear that pt is SOB on the phone)? no 2. How long have you been experiencing SOB? 1 week  3. Are you SOB when sitting or when up moving around? walking  4. Are you currently experiencing any other symptoms? Episodes of chest pain, coming and going     1. Are you having CP right now? no  2. Are you experiencing any other symptoms (ex. SOB, nausea, vomiting, sweating)? SOB  3. How long have you been experiencing CP? 1 WEEK  4. Is your CP continuous or coming and going? Coming and going  5. Have you taken Nitroglycerin? no ?

## 2019-07-25 NOTE — Telephone Encounter (Signed)
Left message on mobile number to call back. Home number busy. We can schedule patient 07/31/19 at 8:20 am.

## 2019-07-26 NOTE — Telephone Encounter (Signed)
Home phone number has been disconnected according to recording.  LM on VM for mobile number for pt to c/b to discuss his s/s and advised of appt available 11/30 at 8:20 with Dr Angelena Form if he still needs it.

## 2019-08-02 NOTE — Telephone Encounter (Signed)
Reached patient on mobile number. He has been having new exertional chest pain and SOB.  It subsides with rest. --taking trash to curb or walking the empty cans back to his house will cause this. Has isosorbide but does not like to take because it causes headache.  Has taken it during these episodes and has gotten relief.  He has SL ntg.  Adv to use this for episodic symptoms.  Reviewed Ntg directions. Adv to try to minimize any exertional activities between now and when he comes for appointment this Friday with Dr. Angelena Form.     Pt reports he did call back to say he could make previously offered appointment.  States he called on a Saturday AM.  The message did not reach me. No appointment was made for 11/30.  Pt states he came to office at that day/time and was told he did not have an appointment.

## 2019-08-03 NOTE — H&P (View-Only) (Signed)
Chief Complaint  Patient presents with  . Follow-up    CAD   History of Present Illness: 73 yo male with history of diabetes mellitus, hypertension, hyperlipidemia, prostate cancer, seizures, arthritis, anxiety/depression, gout, ischemic cardiomyopathy and coronary artery disease who is here today for cardiac follow up. Cardiac cath in 2006 with moderate non-obstructive CAD. Stress myoview 09/28/14 was an intermediate risk study with fixed defect and normal wall motion in that area, LVEF=44%. No clear ischemia. Cardiac cath May 2016 with severe disease in the distal LAD but vessel too small for PCI. LVEF=45-50%. Imdur was recommended but he did not start.   He is here today for follow up. He tells me today that he is having exertional chest pain and dyspnea. This resolves with rest.  He has started back taking Imdur 30 mg per day over the past week and this has helped his pain although the pain is still occurring. The patient denies palpitations, lower extremity edema, orthopnea, PND, dizziness, near syncope or syncope.   Primary Care Physician: [name], MD  Past Medical History:  Diagnosis Date  . Anxiety   . Arthritis   . CAD in native artery    a. cath 2006 - mod CAD, med rx. b. cath 2016 -  20% prox-mid RCA, 30% ost-prox LAD, 30% mLAd, 99% distal LAD-2, 90% distal LAD-1, 50% D2, 70% D3 -> the LAD disease was small caliber/apical and too small for PCI. EF was 45-50%  . Depression   . Diabetes mellitus type 2 in nonobese (HCC)   . GERD (gastroesophageal reflux disease)   . Gout   . Heart murmur   . HTN (hypertension)   . Hx of radiation therapy 01/08/14- 03/01/14   prostate 7600 cGy 38 sessions  . Hyperlipidemia   . Inguinal hernia    recurrent right inguinal hernia  . Ischemic cardiomyopathy    a. EF 45-50% by cath 2016.  . Meatal stenosis 1966, 1967  . Prostate cancer ([place]) 08/10/13   Gleason 6, volume 28.67 cc  . Seizures ([place])     Past Surgical History:  Procedure  Laterality Date  . CARDIAC CATHETERIZATION N/A 01/14/2015   Procedure: Left Heart Cath and Coronary Angiography;  Surgeon: Burnell Blanks, MD;  Location: [facility];  Service: Cardiovascular;  Laterality: N/A;  . HAND SURGERY    . [place]   bilateral herniorrphoes  . PROSTATE BIOPSY  08/10/13   gleason 3+3=6, vol 28.67 cc    Current Outpatient Medications  Medication Sig Dispense Refill  . amLODipine (NORVASC) 5 MG tablet Take 5 mg by mouth daily.    Marland Kitchen aspirin EC 81 MG tablet Take 81 mg by mouth daily.    . carvedilol (COREG) 6.25 MG tablet Take 6.25 mg by mouth 2 (two) times daily with a meal.     . Coenzyme Q10 200 MG capsule Take 200 mg by mouth daily.    . fish oil-omega-3 fatty acids 1000 MG capsule Take 1 g by mouth daily.     Marland Kitchen glimepiride (AMARYL) 4 MG tablet Take 4 mg by mouth 2 (two) times daily.     . isosorbide mononitrate (IMDUR) 30 MG 24 hr tablet Take 30 mg by mouth daily.    Marland Kitchen JANUMET 50-500 MG per tablet Take 1 tablet by mouth daily.  0  . lisinopril (ZESTRIL) 5 MG tablet Take 5 mg by mouth daily.    . Multiple Vitamins-Minerals (MULTIVITAMIN WITH MINERALS) tablet  Take 1 tablet by mouth daily.    . naproxen sodium (ANAPROX) 220 MG tablet Take 440 mg by mouth 2 (two) times daily as needed (back pain).    . nitroGLYCERIN (NITROSTAT) 0.4 MG SL tablet Place 1 tablet (0.4 mg total) under the tongue every 5 (five) minutes as needed for chest pain. 25 tablet 6  . rosuvastatin (CRESTOR) 5 MG tablet Take 5 mg by mouth daily.  1  . vitamin B-12 (CYANOCOBALAMIN) 1000 MCG tablet Take 1,000 mcg by mouth daily.     No current facility-administered medications for this visit.     Allergies  Allergen Reactions  . Other Other (See Comments)    Felt dizzy & sick with one pain medication, name unknown by pt.   . Vicodin [Hydrocodone-Acetaminophen] Other (See Comments)    dizzy and sick feeling/Knocks him out    Social History    Socioeconomic History  . Marital status: Married    Spouse name: Not on file  . Number of children: Not on file  . Years of education: Not on file  . Highest education level: Not on file  Occupational History  . Not on file  Social Needs  . Financial resource strain: Not on file  . Food insecurity    Worry: Not on file    Inability: Not on file  . Transportation needs    Medical: Not on file    Non-medical: Not on file  Tobacco Use  . Smoking status: Never Smoker  . Smokeless tobacco: Never Used  Substance and Sexual Activity  . Alcohol use: Yes    Comment: ocasionally  . Drug use: No  . Sexual activity: Not on file  Lifestyle  . Physical activity    Days per week: Not on file    Minutes per session: Not on file  . Stress: Not on file  Relationships  . Social Herbalist on phone: Not on file    Gets together: Not on file    Attends religious service: Not on file    Active member of club or organization: Not on file    Attends meetings of clubs or organizations: Not on file    Relationship status: Not on file  . Intimate partner violence    Fear of current or ex partner: Not on file    Emotionally abused: Not on file    Physically abused: Not on file    Forced sexual activity: Not on file  Other Topics Concern  . Not on file  Social History Narrative  . Not on file    Family History  Problem Relation Age of Onset  . Diabetes Mother   . Stroke Mother   . Diabetes Sister   . Heart attack Sister   . Diabetes Brother   . Cancer Father        larynx  . Stroke Brother     Review of Systems:  As stated in the HPI and otherwise negative.   BP (!) 142/74   Pulse 61   Ht 5' 11.5" (1.816 m)   Wt 175 lb (79.4 kg)   SpO2 97%   BMI 24.07 kg/m   Physical Examination: General: Well developed, well nourished, NAD  HEENT: OP clear, mucus membranes moist  SKIN: warm, dry. No rashes. Neuro: No focal deficits  Musculoskeletal: Muscle strength 5/5 all  ext  Psychiatric: Mood and affect normal  Neck: No JVD, no carotid bruits, no thyromegaly, no lymphadenopathy.  Lungs:Clear bilaterally,  no wheezes, rhonci, crackles Cardiovascular: Regular rate and rhythm. No murmurs, gallops or rubs. Abdomen:Soft. Bowel sounds present. Non-tender.  Extremities: No lower extremity edema. Pulses are 2 + in the bilateral DP/PT.  EKG:  EKG is ordered today. The ekg ordered today demonstrates sinus, rate 61 bpm. 1st degree AV block  Recent Labs: No results found for requested labs within last 8760 hours.   Lipid Panel    Component Value Date/Time   CHOL 149 01/03/2018 1151   TRIG 60 01/03/2018 1151   HDL 66 01/03/2018 1151   CHOLHDL 2.3 01/03/2018 1151   CHOLHDL 3 03/18/2009 0902   VLDL 10.4 03/18/2009 0902   LDLCALC 71 01/03/2018 1151   LDLDIRECT 148.4 12/28/2007 1545     Wt Readings from Last 3 Encounters:  08/04/19 175 lb (79.4 kg)  08/15/18 175 lb 6.4 oz (79.6 kg)  02/22/18 168 lb (76.2 kg)     Other studies Reviewed: Additional studies/ records that were reviewed today include: . Review of the above records demonstrates:    Assessment and Plan:   1. CAD with unstable angina: He describes chest pain with exertion. This resolves with rest. This is consistent with unstable angina. Cardiac cath is indicated. Will arrange cardiac [facility] at [facility] 08/14/19 with possible PCI.  I have reviewed the risks, indications, and alternatives to cardiac catheterization, possible angioplasty, and stenting with the patient. Risks include but are not limited to bleeding, infection, vascular injury, stroke, myocardial infection, arrhythmia, kidney injury, radiation-related injury in the case of prolonged fluoroscopy use, emergency cardiac surgery, and death. The patient understands the risks of serious complication is 1-2 in [id number] with diagnostic cardiac cath and 1-2% or less with angioplasty/stenting. Continue ASA, amlodipine, Coreg and Crestor.    Pre cath labs  today  2. HTN: BP is controlled.   3. HLD: Lipids are followed in primary care. LDL at goal in 2019. Continue statin.   Current medicines are reviewed at length with the patient today.  The patient does not have concerns regarding medicines.  The following changes have been made:  no change  Labs/ tests ordered today include:   Orders Placed This Encounter  Procedures  . Basic Metabolic Panel (BMET)  . CBC  . EKG 12-Lead    Disposition:   FU with me after cath in 3-4 weeks.   Signed, Lauree Chandler, MD 08/04/2019 4:29 PM    [address], [place], [place]  [address] Phone: [phone]; Fax: [phone]

## 2019-08-03 NOTE — Progress Notes (Signed)
Chief Complaint  Patient presents with  . Follow-up    CAD   History of Present Illness: 73 yo male with history of diabetes mellitus, hypertension, hyperlipidemia, prostate cancer, seizures, arthritis, anxiety/depression, gout, ischemic cardiomyopathy and coronary artery disease who is here today for cardiac follow up. Cardiac cath in 2006 with moderate non-obstructive CAD. Stress myoview 09/28/14 was an intermediate risk study with fixed defect and normal wall motion in that area, LVEF=44%. No clear ischemia. Cardiac cath May 2016 with severe disease in the distal LAD but vessel too small for PCI. LVEF=45-50%. Imdur was recommended but he did not start.   He is here today for follow up. He tells me today that he is having exertional chest pain and dyspnea. This resolves with rest.  He has started back taking Imdur 30 mg per day over the past week and this has helped his pain although the pain is still occurring. The patient denies palpitations, lower extremity edema, orthopnea, PND, dizziness, near syncope or syncope.   Primary Care Physician: [name], MD  Past Medical History:  Diagnosis Date  . Anxiety   . Arthritis   . CAD in native artery    a. cath 2006 - mod CAD, med rx. b. cath 2016 -  20% prox-mid RCA, 30% ost-prox LAD, 30% mLAd, 99% distal LAD-2, 90% distal LAD-1, 50% D2, 70% D3 -> the LAD disease was small caliber/apical and too small for PCI. EF was 45-50%  . Depression   . Diabetes mellitus type 2 in nonobese (HCC)   . GERD (gastroesophageal reflux disease)   . Gout   . Heart murmur   . HTN (hypertension)   . Hx of radiation therapy 01/08/14- 03/01/14   prostate 7600 cGy 38 sessions  . Hyperlipidemia   . Inguinal hernia    recurrent right inguinal hernia  . Ischemic cardiomyopathy    a. EF 45-50% by cath 2016.  . Meatal stenosis 1966, 1967  . Prostate cancer ([place]) 08/10/13   Gleason 6, volume 28.67 cc  . Seizures ([place])     Past Surgical History:  Procedure  Laterality Date  . CARDIAC CATHETERIZATION N/A 01/14/2015   Procedure: Left Heart Cath and Coronary Angiography;  Surgeon: Burnell Blanks, MD;  Location: [facility];  Service: Cardiovascular;  Laterality: N/A;  . HAND SURGERY    . [place]   bilateral herniorrphoes  . PROSTATE BIOPSY  08/10/13   gleason 3+3=6, vol 28.67 cc    Current Outpatient Medications  Medication Sig Dispense Refill  . amLODipine (NORVASC) 5 MG tablet Take 5 mg by mouth daily.    Marland Kitchen aspirin EC 81 MG tablet Take 81 mg by mouth daily.    . carvedilol (COREG) 6.25 MG tablet Take 6.25 mg by mouth 2 (two) times daily with a meal.     . Coenzyme Q10 200 MG capsule Take 200 mg by mouth daily.    . fish oil-omega-3 fatty acids 1000 MG capsule Take 1 g by mouth daily.     Marland Kitchen glimepiride (AMARYL) 4 MG tablet Take 4 mg by mouth 2 (two) times daily.     . isosorbide mononitrate (IMDUR) 30 MG 24 hr tablet Take 30 mg by mouth daily.    Marland Kitchen JANUMET 50-500 MG per tablet Take 1 tablet by mouth daily.  0  . lisinopril (ZESTRIL) 5 MG tablet Take 5 mg by mouth daily.    . Multiple Vitamins-Minerals (MULTIVITAMIN WITH MINERALS) tablet  Take 1 tablet by mouth daily.    . naproxen sodium (ANAPROX) 220 MG tablet Take 440 mg by mouth 2 (two) times daily as needed (back pain).    . nitroGLYCERIN (NITROSTAT) 0.4 MG SL tablet Place 1 tablet (0.4 mg total) under the tongue every 5 (five) minutes as needed for chest pain. 25 tablet 6  . rosuvastatin (CRESTOR) 5 MG tablet Take 5 mg by mouth daily.  1  . vitamin B-12 (CYANOCOBALAMIN) 1000 MCG tablet Take 1,000 mcg by mouth daily.     No current facility-administered medications for this visit.     Allergies  Allergen Reactions  . Other Other (See Comments)    Felt dizzy & sick with one pain medication, name unknown by pt.   . Vicodin [Hydrocodone-Acetaminophen] Other (See Comments)    dizzy and sick feeling/Knocks him out    Social History    Socioeconomic History  . Marital status: Married    Spouse name: Not on file  . Number of children: Not on file  . Years of education: Not on file  . Highest education level: Not on file  Occupational History  . Not on file  Social Needs  . Financial resource strain: Not on file  . Food insecurity    Worry: Not on file    Inability: Not on file  . Transportation needs    Medical: Not on file    Non-medical: Not on file  Tobacco Use  . Smoking status: Never Smoker  . Smokeless tobacco: Never Used  Substance and Sexual Activity  . Alcohol use: Yes    Comment: ocasionally  . Drug use: No  . Sexual activity: Not on file  Lifestyle  . Physical activity    Days per week: Not on file    Minutes per session: Not on file  . Stress: Not on file  Relationships  . Social Herbalist on phone: Not on file    Gets together: Not on file    Attends religious service: Not on file    Active member of club or organization: Not on file    Attends meetings of clubs or organizations: Not on file    Relationship status: Not on file  . Intimate partner violence    Fear of current or ex partner: Not on file    Emotionally abused: Not on file    Physically abused: Not on file    Forced sexual activity: Not on file  Other Topics Concern  . Not on file  Social History Narrative  . Not on file    Family History  Problem Relation Age of Onset  . Diabetes Mother   . Stroke Mother   . Diabetes Sister   . Heart attack Sister   . Diabetes Brother   . Cancer Father        larynx  . Stroke Brother     Review of Systems:  As stated in the HPI and otherwise negative.   BP (!) 142/74   Pulse 61   Ht 5' 11.5" (1.816 m)   Wt 175 lb (79.4 kg)   SpO2 97%   BMI 24.07 kg/m   Physical Examination: General: Well developed, well nourished, NAD  HEENT: OP clear, mucus membranes moist  SKIN: warm, dry. No rashes. Neuro: No focal deficits  Musculoskeletal: Muscle strength 5/5 all  ext  Psychiatric: Mood and affect normal  Neck: No JVD, no carotid bruits, no thyromegaly, no lymphadenopathy.  Lungs:Clear bilaterally,  no wheezes, rhonci, crackles Cardiovascular: Regular rate and rhythm. No murmurs, gallops or rubs. Abdomen:Soft. Bowel sounds present. Non-tender.  Extremities: No lower extremity edema. Pulses are 2 + in the bilateral DP/PT.  EKG:  EKG is ordered today. The ekg ordered today demonstrates sinus, rate 61 bpm. 1st degree AV block  Recent Labs: No results found for requested labs within last 8760 hours.   Lipid Panel    Component Value Date/Time   CHOL 149 01/03/2018 1151   TRIG 60 01/03/2018 1151   HDL 66 01/03/2018 1151   CHOLHDL 2.3 01/03/2018 1151   CHOLHDL 3 03/18/2009 0902   VLDL 10.4 03/18/2009 0902   LDLCALC 71 01/03/2018 1151   LDLDIRECT 148.4 12/28/2007 1545     Wt Readings from Last 3 Encounters:  08/04/19 175 lb (79.4 kg)  08/15/18 175 lb 6.4 oz (79.6 kg)  02/22/18 168 lb (76.2 kg)     Other studies Reviewed: Additional studies/ records that were reviewed today include: . Review of the above records demonstrates:    Assessment and Plan:   1. CAD with unstable angina: He describes chest pain with exertion. This resolves with rest. This is consistent with unstable angina. Cardiac cath is indicated. Will arrange cardiac cath at [facility] 08/14/19 with possible PCI.  I have reviewed the risks, indications, and alternatives to cardiac catheterization, possible angioplasty, and stenting with the patient. Risks include but are not limited to bleeding, infection, vascular injury, stroke, myocardial infection, arrhythmia, kidney injury, radiation-related injury in the case of prolonged fluoroscopy use, emergency cardiac surgery, and death. The patient understands the risks of serious complication is 1-2 in [id number] with diagnostic cardiac cath and 1-2% or less with angioplasty/stenting. Continue ASA, amlodipine, Coreg and Crestor.    Pre cath labs  today  2. HTN: BP is controlled.   3. HLD: Lipids are followed in primary care. LDL at goal in 2019. Continue statin.   Current medicines are reviewed at length with the patient today.  The patient does not have concerns regarding medicines.  The following changes have been made:  no change  Labs/ tests ordered today include:   Orders Placed This Encounter  Procedures  . Basic Metabolic Panel (BMET)  . CBC  . EKG 12-Lead    Disposition:   FU with me after cath in 3-4 weeks.   Signed, Lauree Chandler, MD 08/04/2019 4:29 PM    [address], [place], [place]  [address] Phone: [phone]; Fax: [phone]

## 2019-08-04 ENCOUNTER — Other Ambulatory Visit: Payer: Self-pay

## 2019-08-04 ENCOUNTER — Ambulatory Visit: Payer: Medicare Other | Admitting: Cardiovascular Disease

## 2019-08-04 ENCOUNTER — Encounter: Payer: Self-pay | Admitting: *Deleted

## 2019-08-04 ENCOUNTER — Encounter: Payer: Self-pay | Admitting: Cardiovascular Disease

## 2019-08-04 VITALS — BP 142/74 | HR 61 | Ht 71.5 in | Wt 175.0 lb

## 2019-08-04 DIAGNOSIS — I25110 Atherosclerotic heart disease of native coronary artery with unstable angina pectoris: Secondary | ICD-10-CM

## 2019-08-04 DIAGNOSIS — I10 Essential (primary) hypertension: Secondary | ICD-10-CM | POA: Diagnosis not present

## 2019-08-04 DIAGNOSIS — E785 Hyperlipidemia, unspecified: Secondary | ICD-10-CM

## 2019-08-04 NOTE — Patient Instructions (Signed)
Medication Instructions:  No changes  *If you need a refill on your cardiac medications before your next appointment, please call your pharmacy*  Lab Work: Today: bmet, cbc If you have labs (blood work) drawn today and your tests are completely normal, you will receive your results only by: Marland Kitchen MyChart Message (if you have MyChart) OR . A paper copy in the mail If you have any lab test that is abnormal or we need to change your treatment, we will call you to review the results.  Testing/Procedures: Your physician has requested that you have a cardiac catheterization. Cardiac catheterization is used to diagnose and/or treat various heart conditions. Doctors may recommend this procedure for a number of different reasons. The most common reason is to evaluate chest pain. Chest pain can be a symptom of coronary artery disease (CAD), and cardiac catheterization can show whether plaque is narrowing or blocking your heart's arteries. This procedure is also used to evaluate the valves, as well as measure the blood flow and oxygen levels in different parts of your heart. For further information please visit [email or web address]. Please follow instruction sheet, as given.  COVID SCREENING PRIOR  Follow-Up: At [facility], you and your health needs are our priority.  As part of our continuing mission to provide you with exceptional heart care, we have created designated Provider Care Teams.  These Care Teams include your primary Cardiologist (physician) and Advanced Practice Providers (APPs -  Physician Assistants and Nurse Practitioners) who all work together to provide you with the care you need, when you need it.  Your next appointment:   4-5 WEEKS   The format for your next appointment:   In Person  Provider:   Care team providers  Other Instructions

## 2019-08-05 LAB — BASIC METABOLIC PANEL
BUN/Creatinine Ratio: 14 (ref 10–24)
BUN: 12 mg/dL (ref 8–27)
CO2: 25 mmol/L (ref 20–29)
Calcium: 9.7 mg/dL (ref 8.6–10.2)
Chloride: 102 mmol/L (ref 96–106)
Creatinine, Ser: 0.84 mg/dL (ref 0.76–1.27)
GFR calc Af Amer: 99 mL/min/{1.73_m2} (ref >59)
GFR calc non Af Amer: 86 mL/min/{1.73_m2} (ref >59)
Glucose: 85 mg/dL (ref 65–99)
Potassium: 4.2 mmol/L (ref 3.5–5.2)
Sodium: 139 mmol/L (ref 134–144)

## 2019-08-05 LAB — CBC
Hematocrit: 43.1 % (ref 37.5–51.0)
Hemoglobin: 14 g/dL (ref 13.0–17.7)
MCH: 27.8 pg (ref 26.6–33.0)
MCHC: 32.5 g/dL (ref 31.5–35.7)
MCV: 86 fL (ref 79–97)
Platelets: 246 10*3/uL (ref 150–450)
RBC: 5.04 x10E6/uL (ref 4.14–5.80)
RDW: 12.8 % (ref 11.6–15.4)
WBC: 9.7 10*3/uL (ref 3.4–10.8)

## 2019-08-10 ENCOUNTER — Telehealth: Payer: Self-pay | Admitting: *Deleted

## 2019-08-10 NOTE — Telephone Encounter (Addendum)
Pt contacted pre-catheterization scheduled at [facility] for: Monday August 14, 2019 10:30 AM Verified arrival time and place: [place] [facility]) at: 8:30 AM   No solid food after midnight prior to cath, clear liquids until 5 AM day of procedure. Contrast allergy: no  Hold: Janumet-day of procedure and 48 hours post procedure. Glimepiride-AM of procedure  Except hold medications AM meds can be  taken pre-cath with sip of water including: ASA 81 mg   Confirmed patient has responsible adult to drive home post procedure and observe 24 hours after arriving home: yes  Currently, due to Covid-19 pandemic, only one support person will be allowed with patient. Must be the same support person for that patient's entire stay, will be screened and required to wear a mask. They will be asked to wait in the waiting room for the duration of the patient's stay.  Patients are required to wear a mask when they enter the hospital.      COVID-19 Pre-Screening Questions:  . In the past 7 to 10 days have you had a cough,  shortness of breath, headache, congestion, fever (100 or greater) body aches, chills, sore throat, or sudden loss of taste or sense of smell? Shortness of breath, not new in past 7-10 days . Have you been around anyone with known Covid 19? no . Have you been around anyone who is awaiting Covid 19 test results in the past 7 to 10 days? no . Have you been around anyone who has been exposed to Covid 19, or has mentioned symptoms of Covid 19 within the past 7 to 10 days? no     I reviewed procedure/mask/visitor instructions, Covid-19 screening questions with patient, he verbalized understanding, thanked me for call.

## 2019-08-11 ENCOUNTER — Other Ambulatory Visit (HOSPITAL_COMMUNITY)
Admission: RE | Admit: 2019-08-11 | Discharge: 2019-08-11 | Disposition: A | Discharge status: Home / Self Care | Payer: Medicare Other | Source: Ambulatory Visit | Attending: Cardiovascular Disease | Admitting: Cardiovascular Disease

## 2019-08-11 DIAGNOSIS — Z20828 Contact with and (suspected) exposure to other viral communicable diseases: Secondary | ICD-10-CM | POA: Insufficient documentation

## 2019-08-11 DIAGNOSIS — Z01812 Encounter for preprocedural laboratory examination: Secondary | ICD-10-CM | POA: Insufficient documentation

## 2019-08-11 LAB — SARS CORONAVIRUS 2 (TAT 6-24 HRS): SARS Coronavirus 2: NEGATIVE

## 2019-08-14 ENCOUNTER — Encounter (HOSPITAL_COMMUNITY)
Admission: RE | Disposition: A | Discharge status: Home / Self Care | Payer: Self-pay | Source: Home / Self Care | Attending: Cardiovascular Disease

## 2019-08-14 ENCOUNTER — Encounter: Payer: Self-pay | Admitting: Cardiology

## 2019-08-14 ENCOUNTER — Other Ambulatory Visit: Payer: Self-pay

## 2019-08-14 ENCOUNTER — Ambulatory Visit (HOSPITAL_COMMUNITY)
Admission: RE | Admit: 2019-08-14 | Discharge: 2019-08-14 | Disposition: A | Discharge status: Home / Self Care | Payer: Medicare Other | Attending: Cardiovascular Disease | Admitting: Cardiovascular Disease

## 2019-08-14 DIAGNOSIS — Z885 Allergy status to narcotic agent status: Secondary | ICD-10-CM | POA: Insufficient documentation

## 2019-08-14 DIAGNOSIS — R072 Precordial pain: Secondary | ICD-10-CM

## 2019-08-14 DIAGNOSIS — I255 Ischemic cardiomyopathy: Secondary | ICD-10-CM | POA: Diagnosis not present

## 2019-08-14 DIAGNOSIS — I25110 Atherosclerotic heart disease of native coronary artery with unstable angina pectoris: Secondary | ICD-10-CM

## 2019-08-14 DIAGNOSIS — K219 Gastro-esophageal reflux disease without esophagitis: Secondary | ICD-10-CM | POA: Diagnosis not present

## 2019-08-14 DIAGNOSIS — E785 Hyperlipidemia, unspecified: Secondary | ICD-10-CM | POA: Insufficient documentation

## 2019-08-14 DIAGNOSIS — Z7984 Long term (current) use of oral hypoglycemic drugs: Secondary | ICD-10-CM | POA: Diagnosis not present

## 2019-08-14 DIAGNOSIS — Z79899 Other long term (current) drug therapy: Secondary | ICD-10-CM | POA: Diagnosis not present

## 2019-08-14 DIAGNOSIS — Z7982 Long term (current) use of aspirin: Secondary | ICD-10-CM | POA: Insufficient documentation

## 2019-08-14 DIAGNOSIS — E119 Type 2 diabetes mellitus without complications: Secondary | ICD-10-CM | POA: Diagnosis not present

## 2019-08-14 DIAGNOSIS — I2510 Atherosclerotic heart disease of native coronary artery without angina pectoris: Secondary | ICD-10-CM

## 2019-08-14 DIAGNOSIS — Z8249 Family history of ischemic heart disease and other diseases of the circulatory system: Secondary | ICD-10-CM | POA: Diagnosis not present

## 2019-08-14 DIAGNOSIS — M1990 Unspecified osteoarthritis, unspecified site: Secondary | ICD-10-CM | POA: Diagnosis not present

## 2019-08-14 DIAGNOSIS — I10 Essential (primary) hypertension: Secondary | ICD-10-CM | POA: Insufficient documentation

## 2019-08-14 DIAGNOSIS — Z833 Family history of diabetes mellitus: Secondary | ICD-10-CM | POA: Diagnosis not present

## 2019-08-14 HISTORY — PX: LEFT HEART CATH AND CORONARY ANGIOGRAPHY: CATH118249

## 2019-08-14 LAB — GLUCOSE, CAPILLARY: Glucose-Capillary: 106 mg/dL — ABNORMAL HIGH (ref 70–99)

## 2019-08-14 LAB — NO BLOOD PRODUCTS

## 2019-08-14 SURGERY — LEFT HEART CATH AND CORONARY ANGIOGRAPHY
Anesthesia: LOCAL

## 2019-08-14 MED ORDER — ASPIRIN 81 MG PO CHEW: 81.0000 mg | CHEWABLE_TABLET | ORAL | Status: DC

## 2019-08-14 MED ORDER — FENTANYL CITRATE (PF) 100 MCG/2ML IJ SOLN
INTRAMUSCULAR | Status: DC | PRN
  Administered 2019-08-14: 25 ug via INTRAVENOUS

## 2019-08-14 MED ORDER — SODIUM CHLORIDE 0.9 % WEIGHT BASED INFUSION
3.0000 mL/kg/h | INTRAVENOUS | Status: AC
  Administered 2019-08-14: 10:00:00 3 mL/kg/h via INTRAVENOUS

## 2019-08-14 MED ORDER — SODIUM CHLORIDE 0.9 % WEIGHT BASED INFUSION: 1.0000 mL/kg/h | INTRAVENOUS | Status: DC

## 2019-08-14 MED ORDER — SODIUM CHLORIDE 0.9% FLUSH: 3.0000 mL | INTRAVENOUS | Status: DC | PRN

## 2019-08-14 MED ORDER — VERAPAMIL HCL 2.5 MG/ML IV SOLN
INTRAVENOUS | Status: AC
  Filled 2019-08-14: qty 2

## 2019-08-14 MED ORDER — IOHEXOL 350 MG/ML SOLN
INTRAVENOUS | Status: DC | PRN
  Administered 2019-08-14: 11:00:00 70 mL via INTRA_ARTERIAL

## 2019-08-14 MED ORDER — HEPARIN (PORCINE) IN NACL 1000-0.9 UT/500ML-% IV SOLN
INTRAVENOUS | Status: AC
  Filled 2019-08-14: qty 500

## 2019-08-14 MED ORDER — SODIUM CHLORIDE 0.9 % IV SOLN: 250.0000 mL | INTRAVENOUS | Status: DC | PRN

## 2019-08-14 MED ORDER — MIDAZOLAM HCL 2 MG/2ML IJ SOLN
INTRAMUSCULAR | Status: DC | PRN
  Administered 2019-08-14: 1 mg via INTRAVENOUS

## 2019-08-14 MED ORDER — LABETALOL HCL 5 MG/ML IV SOLN: 10.0000 mg | INTRAVENOUS | Status: DC | PRN

## 2019-08-14 MED ORDER — ONDANSETRON HCL 4 MG/2ML IJ SOLN: 4.0000 mg | Freq: Four times a day (QID) | INTRAMUSCULAR | Status: DC | PRN

## 2019-08-14 MED ORDER — HYDRALAZINE HCL 20 MG/ML IJ SOLN: 10.0000 mg | INTRAMUSCULAR | Status: DC | PRN

## 2019-08-14 MED ORDER — SODIUM CHLORIDE 0.9% FLUSH: 3.0000 mL | Freq: Two times a day (BID) | INTRAVENOUS | Status: DC

## 2019-08-14 MED ORDER — VERAPAMIL HCL 2.5 MG/ML IV SOLN
INTRAVENOUS | Status: DC | PRN
  Administered 2019-08-14: 11:00:00 10 mL via INTRA_ARTERIAL

## 2019-08-14 MED ORDER — FENTANYL CITRATE (PF) 100 MCG/2ML IJ SOLN
INTRAMUSCULAR | Status: AC
  Filled 2019-08-14: qty 2

## 2019-08-14 MED ORDER — HEPARIN (PORCINE) IN NACL 1000-0.9 UT/500ML-% IV SOLN
INTRAVENOUS | Status: DC | PRN
  Administered 2019-08-14 (×2): 500 mL

## 2019-08-14 MED ORDER — HEPARIN SODIUM (PORCINE) 1000 UNIT/ML IJ SOLN
INTRAMUSCULAR | Status: DC | PRN
  Administered 2019-08-14: 4000 [IU] via INTRAVENOUS

## 2019-08-14 MED ORDER — LIDOCAINE HCL (PF) 1 % IJ SOLN
INTRAMUSCULAR | Status: AC
  Filled 2019-08-14: qty 30

## 2019-08-14 MED ORDER — HEPARIN SODIUM (PORCINE) 1000 UNIT/ML IJ SOLN
INTRAMUSCULAR | Status: AC
  Filled 2019-08-14: qty 1

## 2019-08-14 MED ORDER — MIDAZOLAM HCL 2 MG/2ML IJ SOLN
INTRAMUSCULAR | Status: AC
  Filled 2019-08-14: qty 2

## 2019-08-14 MED ORDER — SODIUM CHLORIDE 0.9 % IV SOLN: INTRAVENOUS | Status: AC

## 2019-08-14 MED ORDER — LIDOCAINE HCL (PF) 1 % IJ SOLN
INTRAMUSCULAR | Status: DC | PRN
  Administered 2019-08-14: 2 mL

## 2019-08-14 SURGICAL SUPPLY — 10 items
CATH 5FR JL3.5 JR4 ANG PIG MP (CATHETERS) ×2 IMPLANT
DEVICE RAD COMP TR BAND LRG (VASCULAR PRODUCTS) ×2 IMPLANT
GLIDESHEATH SLEND SS 6F .021 (SHEATH) ×2 IMPLANT
GUIDEWIRE INQWIRE 1.5J.035X260 (WIRE) ×1 IMPLANT
INQWIRE 1.5J .035X260CM (WIRE) ×2
KIT HEART LEFT (KITS) ×2 IMPLANT
PACK CARDIAC CATHETERIZATION (CUSTOM PROCEDURE TRAY) ×2 IMPLANT
SYR MEDRAD MARK 7 150ML (SYRINGE) ×2 IMPLANT
TRANSDUCER W/STOPCOCK (MISCELLANEOUS) ×2 IMPLANT
TUBING CIL FLEX 10 FLL-RA (TUBING) ×2 IMPLANT

## 2019-08-14 NOTE — Interval H&P Note (Signed)
History and Physical Interval Note:  08/14/2019 9:25 AM  Seth Guzman  has presented today for surgery, with the diagnosis of unstable angina. The various methods of treatment have been discussed with the patient and family. After consideration of risks, benefits and other options for treatment, the patient has consented to  Procedure(s): LEFT HEART CATH AND CORONARY ANGIOGRAPHY (N/A) as a surgical intervention.  The patient's history has been reviewed, patient examined, no change in status, stable for surgery.  I have reviewed the patient's chart and labs.  Questions were answered to the patient's satisfaction.    Cath Lab Visit (complete for each Cath Lab visit)  Clinical Evaluation Leading to the Procedure:   ACS: No.  Non-ACS:    Anginal Classification: CCS III  Anti-ischemic medical therapy: Maximal Therapy (2 or more classes of medications)  Non-Invasive Test Results: No non-invasive testing performed  Prior CABG: No previous CABG        Lauree Chandler

## 2019-08-14 NOTE — Discharge Instructions (Signed)
NO JANUMET FOR 2 DAYS  Radial Site Care  This sheet gives you information about how to care for yourself after your procedure. Your health care provider may also give you more specific instructions. If you have problems or questions, contact your health care provider. What can I expect after the procedure? After the procedure, it is common to have:  Bruising and tenderness at the catheter insertion area. Follow these instructions at home: Medicines  Take over-the-counter and prescription medicines only as told by your health care provider. Insertion site care  Follow instructions from your health care provider about how to take care of your insertion site. Make sure you: ? Wash your hands with soap and water before you change your bandage (dressing). If soap and water are not available, use hand sanitizer. ? Change your dressing as told by your health care provider. ? Leave stitches (sutures), skin glue, or adhesive strips in place. These skin closures may need to stay in place for 2 weeks or longer. If adhesive strip edges start to loosen and curl up, you may trim the loose edges. Do not remove adhesive strips completely unless your health care provider tells you to do that.  Check your insertion site every day for signs of infection. Check for: ? Redness, swelling, or pain. ? Fluid or blood. ? Pus or a bad smell. ? Warmth.  Do not take baths, swim, or use a hot tub until your health care provider approves.  You may shower 24-48 hours after the procedure, or as directed by your health care provider. ? Remove the dressing and gently wash the site with plain soap and water. ? Pat the area dry with a clean towel. ? Do not rub the site. That could cause bleeding.  Do not apply powder or lotion to the site. Activity   For 24 hours after the procedure, or as directed by your health care provider: ? Do not flex or bend the affected arm. ? Do not push or pull heavy objects with the  affected arm. ? Do not drive yourself home from the hospital or clinic. You may drive 24 hours after the procedure unless your health care provider tells you not to. ? Do not operate machinery or power tools.  Do not lift anything that is heavier than 10 lb (4.5 kg), or the limit that you are told, until your health care provider says that it is safe.  Ask your health care provider when it is okay to: ? Return to work or school. ? Resume usual physical activities or sports. ? Resume sexual activity. General instructions  If the catheter site starts to bleed, raise your arm and put firm pressure on the site. If the bleeding does not stop, get help right away. This is a medical emergency.  If you went home on the same day as your procedure, a responsible adult should be with you for the first 24 hours after you arrive home.  Keep all follow-up visits as told by your health care provider. This is important. Contact a health care provider if:  You have a fever.  You have redness, swelling, or yellow drainage around your insertion site. Get help right away if:  You have unusual pain at the radial site.  The catheter insertion area swells very fast.  The insertion area is bleeding, and the bleeding does not stop when you hold steady pressure on the area.  Your arm or hand becomes pale, cool, tingly, or numb. These  symptoms may represent a serious problem that is an emergency. Do not wait to see if the symptoms will go away. Get medical help right away. Call your local emergency services (911 in the [place] Do not drive yourself to the hospital. Summary  After the procedure, it is common to have bruising and tenderness at the site.  Follow instructions from your health care provider about how to take care of your radial site wound. Check the wound every day for signs of infection.  Do not lift anything that is heavier than 10 lb (4.5 kg), or the limit that you are told, until your  health care provider says that it is safe. This information is not intended to replace advice given to you by your health care provider. Make sure you discuss any questions you have with your health care provider. Document Released: 09/19/2010 Document Revised: 09/22/2017 Document Reviewed: 09/22/2017 Elsevier Patient Education  2020 [employer].

## 2019-09-04 NOTE — Progress Notes (Signed)
Cardiology Office Note:    Date:  09/05/2019   ID:  Seth, Guzman 1945/11/22, MRN [id number]  PCP:  Seth Bowen, MD  Cardiologist:  Lauree Chandler, MD   Electrophysiologist:  None   Referring MD: Seth Bowen, MD   Chief Complaint  Patient presents with  . Hospitalization Follow-up    s/p cardiac cath     History of Present Illness:    Seth Guzman is a 74 y.o. male with:   Coronary artery disease   Cath 12/2014: severe distal LAD disease (too small for PCI) >> med Rx  Cath 08/2019: severe distal and apical LAD dz (too small for PCI); diffuse dz in small caliber Dx branch >> Med Rx  Ischemic CM  EF 5/16: 45-50  Diabetes mellitus   Hypertension   Hyperlipidemia   Prostate CA  Seizure d/o  DJD  Anxiety/Depression   Gout  [name] was evaluated by Dr. Angelena Form 08/04/2019 for exertional chest pain consistent with unstable angina.  He was set up for cardiac catheterization.  This was performed on 08/14/2019 and demonstrated severe diffuse distal and apical disease in a small caliber LAD and diffuse disease in a small caliber diagonal branch.  Anatomy was unchanged from prior cath and medical therapy was recommended.    He returns for follow-up.  He is here alone.  He was not certain if he should be on isosorbide 30 mg or 60 mg.  He has been cutting the 60 mg tablet in half.  He has only had a couple of episodes of chest discomfort with exertion.  These have been very brief.  He has not had significant shortness of breath.  He has not had syncope or leg swelling.     Prior CV studies:   The following studies were reviewed today:  Cardiac catheterization 08/14/2019 LAD ost 30, mid 30, dist 90/99 (small caliber); D1 50; D3 ost 70 RI 30 LCx normal RCA prox 20, mid 40   Carotid US 04/12/2019 R 1-39; L no disease  Myoview 10/01/2014 Intermediate risk stress nuclear study with no ischemia and a fixed defect secondary to artifact (normal wall motion in  the area). There is diffuse left ventricular hypokinesis with overall LVEF 44%. Echocardiogram to confirm LV dysfunction is recommended.  LV Ejection Fraction: 44%.  LV Wall Motion:  Diffuse hypokinesis.     Past Medical History:  Diagnosis Date  . Anxiety   . Arthritis   . CAD in native artery    a. cath 2006 - mod CAD, med rx. b. cath 2016 -  20% prox-mid RCA, 30% ost-prox LAD, 30% mLAd, 99% distal LAD-2, 90% distal LAD-1, 50% D2, 70% D3 -> the LAD disease was small caliber/apical and too small for PCI. EF was 45-50%  . Depression   . Diabetes mellitus type 2 in nonobese (HCC)   . GERD (gastroesophageal reflux disease)   . Gout   . Heart murmur   . HTN (hypertension)   . Hx of radiation therapy 01/08/14- 03/01/14   prostate 7600 cGy 38 sessions  . Hyperlipidemia   . Inguinal hernia    recurrent right inguinal hernia  . Ischemic cardiomyopathy    a. EF 45-50% by cath 2016.  . Meatal stenosis 1966, 1967  . 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 Seizures ([place])    Surgical Hx: The patient  has a past surgical history that includes Hand surgery; Inguinal hernia repair (1966, 1967); Prostate  biopsy (08/10/13); Cardiac catheterization (N/A, 01/14/2015); and LEFT HEART CATH AND CORONARY ANGIOGRAPHY (N/A, 08/14/2019).   Current Medications: Current Meds  Medication Sig  . amLODipine (NORVASC) 5 MG tablet Take 5 mg by mouth daily.  Marland Kitchen aspirin EC 81 MG tablet Take 81 mg by mouth daily.  . carvedilol (COREG) 6.25 MG tablet Take 6.25 mg by mouth 2 (two) times daily with a meal.   . Coenzyme Q10 200 MG capsule Take 200 mg by mouth daily.  . fish oil-omega-3 fatty acids 1000 MG capsule Take 1 g by mouth daily.   Marland Kitchen glimepiride (AMARYL) 4 MG tablet Take 4 mg by mouth 2 (two) times daily.   . isosorbide mononitrate (IMDUR) 60 MG 24 hr tablet Take 30 mg by mouth daily.   Marland Kitchen JANUMET 50-500 MG per tablet Take 1 tablet by mouth daily.  Marland Kitchen lisinopril (ZESTRIL) 5 MG tablet Take  5 mg by mouth daily.  . Multiple Vitamins-Minerals (MULTIVITAMIN WITH MINERALS) tablet Take 1 tablet by mouth daily.  . naproxen sodium (ANAPROX) 220 MG tablet Take 440 mg by mouth 2 (two) times daily as needed (back pain).  . nitroGLYCERIN (NITROSTAT) 0.4 MG SL tablet Place 1 tablet (0.4 mg total) under the tongue every 5 (five) minutes as needed for chest pain.  . vitamin B-12 (CYANOCOBALAMIN) 1000 MCG tablet Take 1,000 mcg by mouth daily.  . [DISCONTINUED] rosuvastatin (CRESTOR) 5 MG tablet Take 5 mg by mouth daily.     Allergies:   Vicodin [hydrocodone-acetaminophen]   Social History   Tobacco Use  . Smoking status: Never Smoker  . Smokeless tobacco: Never Used  Substance Use Topics  . Alcohol use: Yes    Comment: ocasionally  . Drug use: No     Family Hx: The patient's family history includes Cancer in his father; Diabetes in his brother, mother, and sister; Heart attack in his sister; Stroke in his brother and mother.  ROS:   Please see the history of present illness.    ROS All other systems reviewed and are negative.   EKGs/Labs/Other Test Reviewed:    EKG:  EKG is not ordered today.  The ekg ordered today demonstrates n/a  Recent Labs: 08/04/2019: BUN 12; Creatinine, Ser 0.84; Hemoglobin 14.0; Platelets 246; Potassium 4.2; Sodium 139   Recent Lipid Panel Lab Results  Component Value Date/Time   CHOL 149 01/03/2018 11:51 AM   TRIG 60 01/03/2018 11:51 AM   HDL 66 01/03/2018 11:51 AM   CHOLHDL 2.3 01/03/2018 11:51 AM   CHOLHDL 3 03/18/2009 09:02 AM   LDLCALC 71 01/03/2018 11:51 AM   LDLDIRECT 148.4 12/28/2007 03:45 PM     Physical Exam:    VS:  BP 120/70 (BP Location: Left Arm, Patient Position: Sitting, Cuff Size: Normal)   Pulse 71   Ht 5\' 11"  (1.803 m)   Wt 176 lb (79.8 kg)   SpO2 98%   BMI 24.55 kg/m     Wt Readings from Last 3 Encounters:  09/05/19 176 lb (79.8 kg)  08/14/19 175 lb (79.4 kg)  08/04/19 175 lb (79.4 kg)     Physical Exam    Constitutional: He is oriented to person, place, and time. He appears well-developed and well-nourished. No distress.  HENT:  Head: Normocephalic and atraumatic.  Eyes: No scleral icterus.  Neck: No JVD present. No thyromegaly present.  Cardiovascular: Normal rate, regular rhythm and normal heart sounds.  No murmur heard. Pulmonary/Chest: Effort normal and breath sounds normal. He has no rales.  Abdominal: Soft. There is no hepatomegaly.  Musculoskeletal:        General: No edema.     Comments: R wrist without hematoma  Lymphadenopathy:    He has no cervical adenopathy.  Neurological: He is alert and oriented to person, place, and time.  Skin: Skin is warm and dry.  Psychiatric: He has a normal mood and affect.    ASSESSMENT & PLAN:    1. Coronary artery disease with angina pectoris  He underwent recent cardiac catheterization that demonstrated severe small vessel and distal vessel disease in the LAD and diagonal.  He is managed medically.  He has been doing well with rare chest discomfort.  I told him that he could increase his isosorbide to 60 mg a day to see if this improves his symptoms.  He should let us know if he decides to stick with 60 mg daily.  Continue current dose of amlodipine, aspirin, carvedilol, lisinopril.  Follow-up 6 months.  2. Hyperlipidemia, unspecified hyperlipidemia type Goal <70.  LDL in March 2020 was 96.  Increase rosuvastatin to 10 mg daily.  Obtain fasting lipids LFTs in 3 months.  3. Essential hypertension The patient's blood pressure is controlled on his current regimen.  Continue current therapy.     Dispo:  Return in about 6 months (around 03/04/2020) for Routine Follow Up w/ Dr. Angelena Form, (virtual or in-person).   Medication Adjustments/Labs and Tests Ordered: Current medicines are reviewed at length with the patient today.  Concerns regarding medicines are outlined above.  Tests Ordered: Orders Placed This Encounter  Procedures  . Hepatic  function panel  . Lipid Profile   Medication Changes: Meds ordered this encounter  Medications  . rosuvastatin (CRESTOR) 10 MG tablet    Sig: Take 1 tablet (10 mg total) by mouth daily.    Dispense:  90 tablet    Refill:  3    Signed, Richardson Dopp, PA-C  09/05/2019 5:30 PM    [facility] [place], [place], [place]  [address] Phone: [phone]; Fax: [phone]

## 2019-09-05 ENCOUNTER — Other Ambulatory Visit: Payer: Self-pay

## 2019-09-05 ENCOUNTER — Ambulatory Visit: Payer: Medicare Other | Admitting: Physician Assistant

## 2019-09-05 ENCOUNTER — Encounter: Payer: Self-pay | Admitting: Physician Assistant

## 2019-09-05 ENCOUNTER — Encounter (INDEPENDENT_AMBULATORY_CARE_PROVIDER_SITE_OTHER): Payer: Self-pay

## 2019-09-05 VITALS — BP 120/70 | HR 71 | Ht 71.0 in | Wt 176.0 lb

## 2019-09-05 DIAGNOSIS — I25119 Atherosclerotic heart disease of native coronary artery with unspecified angina pectoris: Secondary | ICD-10-CM | POA: Diagnosis not present

## 2019-09-05 DIAGNOSIS — E785 Hyperlipidemia, unspecified: Secondary | ICD-10-CM

## 2019-09-05 DIAGNOSIS — I10 Essential (primary) hypertension: Secondary | ICD-10-CM

## 2019-09-05 MED ORDER — ROSUVASTATIN CALCIUM 10 MG PO TABS: 10.0000 mg | ORAL_TABLET | Freq: Every day | ORAL | 3 refills | Status: DC

## 2019-09-05 NOTE — Patient Instructions (Addendum)
Medication Instructions:  Your physician has recommended you make the following change in your medication:  1-Increase Crestor 10 mg by mouth daily  *If you need a refill on your cardiac medications before your next appointment, please call your pharmacy*  Lab Work: Your physician recommends that you return for lab work in: 3 months for lipid and liver panel  If you have labs (blood work) drawn today and your tests are completely normal, you will receive your results only by: Marland Kitchen MyChart Message (if you have MyChart) OR . A paper copy in the mail If you have any lab test that is abnormal or we need to change your treatment, we will call you to review the results.  Testing/Procedures: None ordered today.  Follow-Up: At [facility], you and your health needs are our priority.  As part of our continuing mission to provide you with exceptional heart care, we have created designated Provider Care Teams.  These Care Teams include your primary Cardiologist (physician) and Advanced Practice Providers (APPs -  Physician Assistants and Nurse Practitioners) who all work together to provide you with the care you need, when you need it.  Your next appointment:   6 month(s)  The format for your next appointment:   Either In Person or Virtual  Provider:   You may see Lauree Chandler, MD or one of the following Advanced Practice Providers on your designated Care Team:    Melina Copa, PA-C  Ermalinda Barrios, PA-C     Please call our office and let us know if you decide to take 60 mg of your Imdur or stay with 30 mg, so we can update your medication list.

## 2019-11-24 ENCOUNTER — Ambulatory Visit: Payer: Medicare Other | Attending: Internal Medicine

## 2019-11-24 DIAGNOSIS — Z23 Encounter for immunization: Secondary | ICD-10-CM

## 2019-11-24 NOTE — Progress Notes (Signed)
   Covid-19 Vaccination Clinic  Name:  Seth Guzman    MRN: [id number] DOB: [birth year]  11/24/2019  Mr. [name] was observed post Covid-19 immunization for 15 minutes without incident. He was provided with Vaccine Information Sheet and instruction to access the V-Safe system.   Mr. [name] was instructed to call 911 with any severe reactions post vaccine: Marland Kitchen Difficulty breathing  . Swelling of face and throat  . A fast heartbeat  . A bad rash all over body  . Dizziness and weakness   Immunizations Administered    Name Date Dose VIS Date Route   Pfizer COVID-19 Vaccine 11/24/2019  1:59 PM 0.3 mL 08/11/2019 Intramuscular   Manufacturer: [place]   Lot: [id number]   [place]: [id number]

## 2019-12-06 ENCOUNTER — Other Ambulatory Visit: Payer: Medicare Other | Admitting: *Deleted

## 2019-12-06 ENCOUNTER — Other Ambulatory Visit: Payer: Self-pay

## 2019-12-06 DIAGNOSIS — I10 Essential (primary) hypertension: Secondary | ICD-10-CM

## 2019-12-06 DIAGNOSIS — I25119 Atherosclerotic heart disease of native coronary artery with unspecified angina pectoris: Secondary | ICD-10-CM

## 2019-12-06 DIAGNOSIS — E785 Hyperlipidemia, unspecified: Secondary | ICD-10-CM

## 2019-12-06 LAB — HEPATIC FUNCTION PANEL
ALT: 14 IU/L (ref 0–44)
AST: 15 IU/L (ref 0–40)
Albumin: 4.3 g/dL (ref 3.7–4.7)
Alkaline Phosphatase: 69 IU/L (ref 39–117)
Bilirubin Total: 0.4 mg/dL (ref 0.0–1.2)
Bilirubin, Direct: 0.15 mg/dL (ref 0.00–0.40)
Total Protein: 6.4 g/dL (ref 6.0–8.5)

## 2019-12-06 LAB — LIPID PANEL
Chol/HDL Ratio: 2.5 ratio (ref 0.0–5.0)
Cholesterol, Total: 142 mg/dL (ref 100–199)
HDL: 57 mg/dL (ref >39)
LDL Chol Calc (NIH): 69 mg/dL (ref 0–99)
Triglycerides: 85 mg/dL (ref 0–149)
VLDL Cholesterol Cal: 16 mg/dL (ref 5–40)

## 2019-12-19 ENCOUNTER — Ambulatory Visit: Payer: Medicare Other | Attending: Internal Medicine

## 2019-12-19 ENCOUNTER — Telehealth: Payer: Self-pay | Admitting: Cardiovascular Disease

## 2019-12-19 DIAGNOSIS — Z23 Encounter for immunization: Secondary | ICD-10-CM

## 2019-12-19 MED ORDER — ISOSORBIDE MONONITRATE ER 60 MG PO TB24: 30.0000 mg | ORAL_TABLET | Freq: Every day | ORAL | 2 refills | Status: DC

## 2019-12-19 NOTE — Telephone Encounter (Signed)
  *  STAT* If patient is at the pharmacy, call can be transferred to refill team.   1. Which medications need to be refilled? (please list name of each medication and dose if known) isosorbide mononitrate (IMDUR) 60 MG 24 hr tablet  2. Which pharmacy/location (including street and city if local pharmacy) is medication to be sent to? [place], [place] - [address]  3. Do they need a 30 day or 90 day supply? 90 days

## 2019-12-19 NOTE — Progress Notes (Signed)
   Covid-19 Vaccination Clinic  Name:  Seth Guzman    MRN: [id number] DOB: [birth year]  12/19/2019  [name] was observed post Covid-19 immunization for 15 minutes without incident. He was provided with Vaccine Information Sheet and instruction to access the V-Safe system.   [name] was instructed to call 911 with any severe reactions post vaccine: Marland Kitchen Difficulty breathing  . Swelling of face and throat  . A fast heartbeat  . A bad rash all over body  . Dizziness and weakness   Immunizations Administered    Name Date Dose VIS Date Route   Pfizer COVID-19 Vaccine 12/19/2019  9:38 AM 0.3 mL 10/25/2018 Intramuscular   Manufacturer: [place]   Lot: [id number]   [place]: [id number]

## 2019-12-19 NOTE — Telephone Encounter (Signed)
Pt's medication was sent to pt's pharmacy as requested. Confirmation received.  °

## 2020-02-03 ENCOUNTER — Other Ambulatory Visit: Payer: Self-pay

## 2020-02-03 ENCOUNTER — Encounter (HOSPITAL_COMMUNITY): Payer: Self-pay | Admitting: Emergency Medicine

## 2020-02-03 ENCOUNTER — Emergency Department (HOSPITAL_COMMUNITY): Payer: Medicare Other

## 2020-02-03 ENCOUNTER — Emergency Department (HOSPITAL_COMMUNITY)
Admission: EM | Admit: 2020-02-03 | Discharge: 2020-02-03 | Disposition: A | Discharge status: Home / Self Care | Payer: Medicare Other | Attending: Emergency Medicine | Admitting: Emergency Medicine

## 2020-02-03 DIAGNOSIS — Z7984 Long term (current) use of oral hypoglycemic drugs: Secondary | ICD-10-CM | POA: Insufficient documentation

## 2020-02-03 DIAGNOSIS — Z79899 Other long term (current) drug therapy: Secondary | ICD-10-CM | POA: Insufficient documentation

## 2020-02-03 DIAGNOSIS — Z7982 Long term (current) use of aspirin: Secondary | ICD-10-CM | POA: Insufficient documentation

## 2020-02-03 DIAGNOSIS — I2510 Atherosclerotic heart disease of native coronary artery without angina pectoris: Secondary | ICD-10-CM | POA: Insufficient documentation

## 2020-02-03 DIAGNOSIS — I10 Essential (primary) hypertension: Secondary | ICD-10-CM | POA: Insufficient documentation

## 2020-02-03 DIAGNOSIS — E119 Type 2 diabetes mellitus without complications: Secondary | ICD-10-CM | POA: Diagnosis not present

## 2020-02-03 DIAGNOSIS — R55 Syncope and collapse: Secondary | ICD-10-CM | POA: Diagnosis not present

## 2020-02-03 LAB — BASIC METABOLIC PANEL
Anion gap: 10 (ref 5–15)
Anion gap: 8 (ref 5–15)
BUN: 18 mg/dL (ref 8–23)
BUN: 18 mg/dL (ref 8–23)
CO2: 23 mmol/L (ref 22–32)
CO2: 25 mmol/L (ref 22–32)
Calcium: 9.5 mg/dL (ref 8.9–10.3)
Calcium: 8.9 mg/dL (ref 8.9–10.3)
Chloride: 105 mmol/L (ref 98–111)
Chloride: 107 mmol/L (ref 98–111)
Creatinine, Ser: 1.28 mg/dL — ABNORMAL HIGH (ref 0.61–1.24)
Creatinine, Ser: 0.97 mg/dL (ref 0.61–1.24)
GFR calc Af Amer: >60 mL/min (ref >60)
GFR calc Af Amer: >60 mL/min (ref >60)
GFR calc non Af Amer: 54 mL/min — ABNORMAL LOW (ref >60)
GFR calc non Af Amer: >60 mL/min (ref >60)
Glucose, Bld: 185 mg/dL — ABNORMAL HIGH (ref 70–99)
Glucose, Bld: 150 mg/dL — ABNORMAL HIGH (ref 70–99)
Potassium: 5.8 mmol/L — ABNORMAL HIGH (ref 3.5–5.1)
Potassium: 4.3 mmol/L (ref 3.5–5.1)
Sodium: 138 mmol/L (ref 135–145)
Sodium: 140 mmol/L (ref 135–145)

## 2020-02-03 LAB — URINALYSIS, ROUTINE W REFLEX MICROSCOPIC
Bilirubin Urine: NEGATIVE
Glucose, UA: NEGATIVE mg/dL
Hgb urine dipstick: NEGATIVE
Ketones, ur: 20 mg/dL — AB
Leukocytes,Ua: NEGATIVE
Nitrite: NEGATIVE
Protein, ur: NEGATIVE mg/dL
Specific Gravity, Urine: 1.019 (ref 1.005–1.030)
pH: 6 (ref 5.0–8.0)

## 2020-02-03 LAB — TROPONIN I (HIGH SENSITIVITY)
Troponin I (High Sensitivity): 6 ng/L (ref <18)
Troponin I (High Sensitivity): 6 ng/L (ref <18)

## 2020-02-03 LAB — CBC
HCT: 47.7 % (ref 39.0–52.0)
Hemoglobin: 15.1 g/dL (ref 13.0–17.0)
MCH: 27.6 pg (ref 26.0–34.0)
MCHC: 31.7 g/dL (ref 30.0–36.0)
MCV: 87.2 fL (ref 80.0–100.0)
Platelets: 238 10*3/uL (ref 150–400)
RBC: 5.47 MIL/uL (ref 4.22–5.81)
RDW: 13.4 % (ref 11.5–15.5)
WBC: 17.5 10*3/uL — ABNORMAL HIGH (ref 4.0–10.5)
nRBC: 0 % (ref 0.0–0.2)

## 2020-02-03 LAB — CBG MONITORING, ED: Glucose-Capillary: 191 mg/dL — ABNORMAL HIGH (ref 70–99)

## 2020-02-03 MED ORDER — SODIUM ZIRCONIUM CYCLOSILICATE 10 G PO PACK: 10.0000 g | PACK | Freq: Once | ORAL | Status: DC

## 2020-02-03 MED ORDER — ONDANSETRON HCL 4 MG/2ML IJ SOLN
4.0000 mg | Freq: Once | INTRAMUSCULAR | Status: AC
  Administered 2020-02-03: 4 mg via INTRAVENOUS
  Filled 2020-02-03: qty 2

## 2020-02-03 MED ORDER — SODIUM CHLORIDE 0.9 % IV BOLUS
1000.0000 mL | Freq: Once | INTRAVENOUS | Status: AC
  Administered 2020-02-03: 1000 mL via INTRAVENOUS

## 2020-02-03 NOTE — ED Notes (Signed)
Pt alert orients and ambulatory at discharge. Pt verbalized understanding and denies questions at time at discharge.

## 2020-02-03 NOTE — Discharge Instructions (Signed)
You have been evaluated for your passing out episode.  Fortunately your labs are normal.  CT scan of your head and cervical spine without acute injury.  Your EKG is normal.  Please call and follow up with your doctor for further care.  Return if you have any concerns.

## 2020-02-03 NOTE — ED Notes (Signed)
Pt ambulated in hall without assistance or difficulty with even and equal gate. Pt denies dizziness.

## 2020-02-03 NOTE — ED Notes (Signed)
Pt a/o and wife at bedside. Pt given urinal for urine sample.

## 2020-02-03 NOTE — ED Provider Notes (Signed)
[facility] DEPT Provider Note   CSN: [id number] Arrival date & time: 02/03/20  1635     History Chief Complaint  Patient presents with  . Loss of Consciousness    Seth Guzman is a 74 y.o. male.  The history is provided by the patient, the spouse and medical records. No language interpreter was used.  Loss of Consciousness    74 year old male with history of prostate cancer, diabetes, CAD, hypertension, presenting for evaluation of loss of consciousness.  History obtained through patient and through wife who is at bedside.  Was outside mowing this afternoon.  He was using both riding lawnmower and a push mower.  Afterward he came inside, felt fine, sitting on a chair to play with his game.  He also drank a beer.  After sitting for approximately 30 minutes patient developed sensation of nausea, and feeling sick to his stomach.  He went into the bathroom, sat on the commode, and subsequently leaned over and fell to the ground with a brief syncopal episode.  His wife was able to immediately attending to his needs.  Incident happened approximately an hour and half ago.  Currently patient reporting feelings nauseous.  He does not complain of any significant headache or neck pain denies chest pain shortness of breath abdominal pain back pain pain to his extremities or having any focal numbness or focal weakness.  Wife did notice that he is having difficulty formulating his thoughts which is unusual for him.  No recent medication changes, he is up-to-date with Covid vaccination.  He denies vertiginous symptom.  Past Medical History:  Diagnosis Date  . Anxiety   . Arthritis   . CAD in native artery    a. cath 2006 - mod CAD, med rx. b. cath 2016 -  20% prox-mid RCA, 30% ost-prox LAD, 30% mLAd, 99% distal LAD-2, 90% distal LAD-1, 50% D2, 70% D3 -> the LAD disease was small caliber/apical and too small for PCI. EF was 45-50%  . Depression   . Diabetes mellitus type 2  in nonobese (HCC)   . GERD (gastroesophageal reflux disease)   . Gout   . Heart murmur   . HTN (hypertension)   . Hx of radiation therapy 01/08/14- 03/01/14   prostate 7600 cGy 38 sessions  . Hyperlipidemia   . Inguinal hernia    recurrent right inguinal hernia  . Ischemic cardiomyopathy    a. EF 45-50% by cath 2016.  . Meatal stenosis 1966, 1967  . Prostate cancer ([place]) 08/10/13   Gleason 6, volume 28.67 cc  . Seizures [facility])     Patient Active Problem List   Diagnosis Date Noted  . Precordial pain   . Heart murmur   . GERD (gastroesophageal reflux disease)   . Prostate cancer ([place])   . HYPERTENSION, BENIGN 03/21/2009  . CORONARY ATHEROSCLEROSIS, NATIVE VESSEL 03/21/2009  . PURE HYPERCHOLESTEROLEMIA 03/18/2009    Past Surgical History:  Procedure Laterality Date  . CARDIAC CATHETERIZATION N/A 01/14/2015   Procedure: Left Heart Cath and Coronary Angiography;  Surgeon: Burnell Blanks, MD;  Location: [facility];  Service: Cardiovascular;  Laterality: N/A;  . HAND SURGERY    . [place]   bilateral herniorrphoes  . LEFT HEART CATH AND CORONARY ANGIOGRAPHY N/A 08/14/2019   Procedure: LEFT HEART CATH AND CORONARY ANGIOGRAPHY;  Surgeon: Burnell Blanks, MD;  Location: [facility];  Service: Cardiovascular;  Laterality: N/A;  . PROSTATE BIOPSY  08/10/13   gleason 3+3=6, vol 28.67 cc       Family History  Problem Relation Age of Onset  . Diabetes Mother   . Stroke Mother   . Diabetes Sister   . Heart attack Sister   . Diabetes Brother   . Cancer Father        larynx  . Stroke Brother     Social History   Tobacco Use  . Smoking status: Never Smoker  . Smokeless tobacco: Never Used  Substance Use Topics  . Alcohol use: Yes    Comment: ocasionally  . Drug use: No    Home Medications Prior to Admission medications   Medication Sig Start Date End Date Taking? Authorizing Provider  amLODipine (NORVASC) 5 MG  tablet Take 5 mg by mouth daily.    [provider]  aspirin EC 81 MG tablet Take 81 mg by mouth daily.    [provider]  carvedilol (COREG) 6.25 MG tablet Take 6.25 mg by mouth 2 (two) times daily with a meal.     [provider]  Coenzyme Q10 200 MG capsule Take 200 mg by mouth daily.    [provider]  fish oil-omega-3 fatty acids 1000 MG capsule Take 1 g by mouth daily.     [provider]  glimepiride (AMARYL) 4 MG tablet Take 4 mg by mouth 2 (two) times daily.     [provider]  isosorbide mononitrate (IMDUR) 60 MG 24 hr tablet Take 0.5 tablets (30 mg total) by mouth daily. 12/19/19   Burnell Blanks, MD  JANUMET 50-500 MG per tablet Take 1 tablet by mouth daily. 12/13/14   [provider]  lisinopril (ZESTRIL) 5 MG tablet Take 5 mg by mouth daily. 08/01/19   [provider]  Multiple Vitamins-Minerals (MULTIVITAMIN WITH MINERALS) tablet Take 1 tablet by mouth daily.    [provider]  naproxen sodium (ANAPROX) 220 MG tablet Take 440 mg by mouth 2 (two) times daily as needed (back pain).    [provider]  nitroGLYCERIN (NITROSTAT) 0.4 MG SL tablet Place 1 tablet (0.4 mg total) under the tongue every 5 (five) minutes as needed for chest pain. 10/03/14   Burnell Blanks, MD  rosuvastatin (CRESTOR) 10 MG tablet Take 1 tablet (10 mg total) by mouth daily. 09/05/19 12/04/19  Richardson Dopp T, PA-C  vitamin B-12 (CYANOCOBALAMIN) 1000 MCG tablet Take 1,000 mcg by mouth daily.    [provider]    Allergies    Vicodin [hydrocodone-acetaminophen]  Review of Systems   Review of Systems  Cardiovascular: Positive for syncope.  All other systems reviewed and are negative.   Physical Exam Updated Vital Signs BP (!) 95/55 (BP Location: Right Arm)   Pulse (!) 55   Temp 98 F (36.7 C) (Oral)   Resp 20   SpO2 100%   Physical Exam Vitals and nursing note reviewed.  Constitutional:       General: He is not in acute distress.    Appearance: He is well-developed.     Comments: Patient appears apprehensive, mildly tachypneic, and having a delay in speech.  HENT:     Head: Normocephalic and atraumatic.     Comments: No tenderness to scalp, or neck.  No midface tenderness. Eyes:     Extraocular Movements: Extraocular movements intact.     Conjunctiva/sclera: Conjunctivae normal.     Pupils: Pupils are equal, round, and reactive to light.  Cardiovascular:  Rate and Rhythm: Normal rate and regular rhythm.     Pulses: Normal pulses.     Heart sounds: Normal heart sounds. No murmur.  Pulmonary:     Breath sounds: Normal breath sounds. No wheezing, rhonchi or rales.     Comments: Tachypnea Abdominal:     Palpations: Abdomen is soft.     Tenderness: There is no abdominal tenderness.  Musculoskeletal:     Cervical back: Neck supple. No rigidity or tenderness.     Comments: 5 out of 5 strength to all 4 extremities  Skin:    Findings: No rash.  Neurological:     Mental Status: He is alert and oriented to person, place, and time.     GCS: GCS eye subscore is 4. GCS verbal subscore is 5. GCS motor subscore is 6.     Cranial Nerves: Cranial nerves are intact.     Sensory: Sensation is intact.     Motor: Motor function is intact.     Coordination: Coordination is intact.     Comments: Gait not tested.     ED Results / Procedures / Treatments   Labs (all labs ordered are listed, but only abnormal results are displayed) Labs Reviewed  BASIC METABOLIC PANEL - Abnormal; Notable for the following components:      Result Value   Potassium 5.8 (*)    Glucose, Bld 185 (*)    Creatinine, Ser 1.28 (*)    GFR calc non Af Amer 54 (*)    All other components within normal limits  CBC - Abnormal; Notable for the following components:   WBC 17.5 (*)    All other components within normal limits  BASIC METABOLIC PANEL - Abnormal; Notable for the following components:    Glucose, Bld 150 (*)    All other components within normal limits  CBG MONITORING, ED - Abnormal; Notable for the following components:   Glucose-Capillary 191 (*)    All other components within normal limits  URINALYSIS, ROUTINE W REFLEX MICROSCOPIC  TROPONIN I (HIGH SENSITIVITY)  TROPONIN I (HIGH SENSITIVITY)    EKG EKG Interpretation  Date/Time:  Saturday February 03 2020 16:53:46 EDT Ventricular Rate:  59 PR Interval:    QRS Duration: 91 QT Interval:  410 QTC Calculation: 407 R Axis:   73 Text Interpretation: Sinus rhythm Prolonged PR interval 12 Lead; [place]ar No significant change since last tracing Confirmed by Wandra Arthurs [phone]) on 02/03/2020 7:14:38 PM   ED ECG REPORT   Date: 02/03/2020  Rate: 59  Rhythm: normal sinus rhythm  QRS Axis: normal  Intervals: PR prolonged  ST/T Wave abnormalities: normal  Conduction Disutrbances:none  Narrative Interpretation:   Old EKG Reviewed: unchanged  I have personally reviewed the EKG tracing and agree with the computerized printout as noted.   Radiology CT Head Wo Contrast  Result Date: 02/03/2020 CLINICAL DATA:  Head trauma, headache. EXAM: CT HEAD WITHOUT CONTRAST CT CERVICAL SPINE WITHOUT CONTRAST TECHNIQUE: Multidetector CT imaging of the head and cervical spine was performed following the standard protocol without intravenous contrast. Multiplanar CT image reconstructions of the cervical spine were also generated. COMPARISON:  MRI of the cervical spine 08/28/2013, brain MRI 01/27/2004 FINDINGS: CT HEAD FINDINGS Brain: There is moderate to advanced ill-defined hypoattenuation within the cerebral white matter and deep gray nuclei which is nonspecific, but consistent with chronic small vessel ischemic disease. Mild generalized parenchymal atrophy. There is no acute intracranial hemorrhage. No demarcated cortical infarct. No extra-axial fluid collection. No evidence  of intracranial mass. No midline shift. Vascular: No hyperdense  vessel.  Atherosclerotic calcifications. Skull: Normal. Negative for fracture or focal lesion. Sinuses/Orbits: Visualized orbits show no acute finding. Mild ethmoid sinus mucosal thickening. No significant mastoid effusion. Other: There is fairly diffuse scalp edema most notable posteriorly. CT CERVICAL SPINE FINDINGS Alignment: Straightening of the expected cervical lordosis. Mild C3-C4 and C4-C5 grade 1 retrolisthesis. Skull base and vertebrae: The basion-dental and atlanto-dental intervals are maintained.No evidence of acute fracture to the cervical spine. Soft tissues and spinal canal: No prevertebral fluid or swelling. No visible canal hematoma. Nonspecific edema within the posterior neck soft tissues. Disc levels: Cervical spondylosis with multilevel disc space narrowing, posterior disc osteophytes, uncovertebral and facet hypertrophy. There is advanced disc space narrowing at the C3-C4 through C6-C7 levels. There is ossification of the posterior longitudinal ligament greatest at the C5-C6 level. Suspected at least moderate spinal canal stenosis at C3-C4, C4-C5, C5-C6 and C6-C7. Multilevel bony neural foraminal narrowing. Multilevel ventral osteophytes. Upper chest: No consolidation within the imaged lung apices. No visible pneumothorax IMPRESSION: CT head: 1. No evidence of acute intracranial abnormality. 2. Moderate to advanced chronic small vessel ischemic disease. 3. Mild generalized parenchymal atrophy. 4. Mild ethmoid sinus mucosal thickening. 5. Fairly diffuse scalp edema, most notable posteriorly. CT cervical spine: 1. No evidence of acute fracture to the cervical spine. 2. 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 Suspected at least moderate spinal canal stenosis at C3-C4, C4-C5, C5-C6 and C6-C7. Multilevel bony neural foraminal narrowing. 3. Mild C3-C4 and C4-C5 grade 1 retrolisthesis. 4. Nonspecific edema within the posterior neck soft tissues.  Electronically Signed   By: Kellie Simmering DO   On: 02/03/2020 18:23   CT Cervical Spine Wo Contrast  Result Date: 02/03/2020 CLINICAL DATA:  Head trauma, headache. EXAM: CT HEAD WITHOUT CONTRAST CT CERVICAL SPINE WITHOUT CONTRAST TECHNIQUE: Multidetector CT imaging of the head and cervical spine was performed following the standard protocol without intravenous contrast. Multiplanar CT image reconstructions of the cervical spine were also generated. COMPARISON:  MRI of the cervical spine 08/28/2013, brain MRI 01/27/2004 FINDINGS: CT HEAD FINDINGS Brain: There is moderate to advanced ill-defined hypoattenuation within the cerebral white matter and deep gray nuclei which is nonspecific, but consistent with chronic small vessel ischemic disease. Mild generalized parenchymal atrophy. There is no acute intracranial hemorrhage. No demarcated cortical infarct. No extra-axial fluid collection. No evidence of intracranial mass. No midline shift. Vascular: No hyperdense vessel.  Atherosclerotic calcifications. Skull: Normal. Negative for fracture or focal lesion. Sinuses/Orbits: Visualized orbits show no acute finding. Mild ethmoid sinus mucosal thickening. No significant mastoid effusion. Other: There is fairly diffuse scalp edema most notable posteriorly. CT CERVICAL SPINE FINDINGS Alignment: Straightening of the expected cervical lordosis. Mild C3-C4 and C4-C5 grade 1 retrolisthesis. Skull base and vertebrae: The basion-dental and atlanto-dental intervals are maintained.No evidence of acute fracture to the cervical spine. Soft tissues and spinal canal: No prevertebral fluid or swelling. No visible canal hematoma. Nonspecific edema within the posterior neck soft tissues. Disc levels: Cervical spondylosis with multilevel disc space narrowing, posterior disc osteophytes, uncovertebral and facet hypertrophy. There is advanced disc space narrowing at the C3-C4 through C6-C7 levels. There is ossification of the posterior  longitudinal ligament greatest at the C5-C6 level. Suspected at least moderate spinal canal stenosis at C3-C4, C4-C5, C5-C6 and C6-C7. Multilevel bony neural foraminal narrowing. Multilevel ventral osteophytes. Upper chest: No consolidation within the imaged lung apices. No visible pneumothorax IMPRESSION: CT head: 1. No evidence  of acute intracranial abnormality. 2. Moderate to advanced chronic small vessel ischemic disease. 3. Mild generalized parenchymal atrophy. 4. Mild ethmoid sinus mucosal thickening. 5. Fairly diffuse scalp edema, most notable posteriorly. CT cervical spine: 1. No evidence of acute fracture to the cervical spine. 2. 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 Suspected at least moderate spinal canal stenosis at C3-C4, C4-C5, C5-C6 and C6-C7. Multilevel bony neural foraminal narrowing. 3. Mild C3-C4 and C4-C5 grade 1 retrolisthesis. 4. Nonspecific edema within the posterior neck soft tissues. Electronically Signed   By: Kellie Simmering DO   On: 02/03/2020 18:23   DG Chest Portable 1 View  Result Date: 02/03/2020 CLINICAL DATA:  Near syncope and vomiting. EXAM: PORTABLE CHEST 1 VIEW COMPARISON:  June 13, 2013 FINDINGS: The heart size and mediastinal contours are within normal limits. Both lungs are clear. The visualized skeletal structures are unremarkable. IMPRESSION: No active disease. Electronically Signed   By: Virgina Norfolk M.D.   On: 02/03/2020 18:56    Procedures Procedures (including critical care time)  Medications Ordered in ED Medications  sodium chloride 0.9 % bolus 1,000 mL (0 mLs Intravenous Stopped 02/03/20 1932)  ondansetron (ZOFRAN) injection 4 mg (4 mg Intravenous Given 02/03/20 1758)    ED Course  I have reviewed the triage vital signs and the nursing notes.  Pertinent labs & imaging results that were available during my care of the patient were reviewed by me and considered in my medical decision making (see chart  for details).    MDM Rules/Calculators/A&P                      BP 131/64   Pulse 88   Temp 98 F (36.7 C) (Oral)   Resp 18   SpO2 97%   Final Clinical Impression(s) / ED Diagnoses Final diagnoses:  Vasovagal syncope    Rx / DC Orders ED Discharge Orders    None     5:32 PM Patient here for evaluation of a syncopal episode earlier today.  He was outside mowing, resting the chair afterward, felt sick to his stomach and subsequently had a syncopal episode in the bathroom.  He denies any significant pain and there are no obvious signs of injury.  He does have difficulty answering questions due to word finding.  Initial blood pressure is 95/55.  Work-up initiated.  6:53 PM Patient received IV fluid and felt much better.  He is currently resting comfortably.  Zofran given, nausea resolved.  He has normal orthostatic vital sign.  His blood pressure has improved.  Labs remarkable for an elevated potassium of 5.8 however EKG without peaked T waves.  Creatinine is elevated at 1.28.  Elevated white count of 17.5 likely stress demargination.  Head and cervical spine CT without acute finding.  Initial troponin is normal.  CBG 191.  I suspect patient may have had a vasovagal syncope.  I have low suspicion for PE as he is not tachycardic and denies any shortness of breath.  9:26 PM Repeat BMP shows a normal potassium level.  Normal renal function.  Normal delta troponin.  UA is currently pending.  9:58 PM Normal UA.  Ambulate without difficulty.  No dizziness.  Felt much better.  I encourage patient to follow-up with PCP for further care.  Otherwise he is stable for discharge.   Domenic Moras, PA-C 02/03/20 2203    Drenda Freeze, MD 02/04/20 [phone]

## 2020-02-03 NOTE — ED Triage Notes (Signed)
Per wife-states he was cutting the lawn this afternoon-states he was fine when he came in a relaxed in chair-states he started feeling faint and needing to vomit-states he was sitting on toilet  and was vomiting in trash can when he leaned  Forward too far hitting right side head-states she thinks he lost consciousness-

## 2020-06-05 NOTE — Progress Notes (Signed)
Chief Complaint  Patient presents with  . Follow-up    CAD   History of Present Illness: 74 yo male with history of diabetes mellitus, hypertension, hyperlipidemia, prostate cancer, seizures, arthritis, anxiety/depression, gout, ischemic cardiomyopathy and coronary artery disease who is here today for cardiac follow up. Cardiac cath in 2006 with moderate non-obstructive CAD. Stress myoview 09/28/14 was an intermediate risk study with fixed defect and normal wall motion in that area, LVEF=44%. No clear ischemia. Cardiac cath May 2016 with severe disease in the distal LAD but vessel too small for PCI. LVEF=45-50%. He had worsened chest pain in December 2020 and cardiac cath was repeated. This showed stable CAD with severe disease in the distal LAD not felt to be favorable for PCI. He was started on Imdur.   He is here today for follow up. The patient denies any chest pain, dyspnea, palpitations, lower extremity edema, orthopnea, PND, dizziness, near syncope or syncope.   Primary Care Physician: [name], MD  Past Medical History:  Diagnosis Date  . Anxiety   . Arthritis   . CAD in native artery    a. cath 2006 - mod CAD, med rx. b. cath 2016 -  20% prox-mid RCA, 30% ost-prox LAD, 30% mLAd, 99% distal LAD-2, 90% distal LAD-1, 50% D2, 70% D3 -> the LAD disease was small caliber/apical and too small for PCI. EF was 45-50%  . Depression   . Diabetes mellitus type 2 in nonobese (HCC)   . GERD (gastroesophageal reflux disease)   . Gout   . Heart murmur   . HTN (hypertension)   . Hx of radiation therapy 01/08/14- 03/01/14   prostate 7600 cGy 38 sessions  . Hyperlipidemia   . Inguinal hernia    recurrent right inguinal hernia  . Ischemic cardiomyopathy    a. EF 45-50% by cath 2016.  . Meatal stenosis 1966, 1967  . Prostate cancer ([place]) 08/10/13   Gleason 6, volume 28.67 cc  . Seizures ([place])     Past Surgical History:  Procedure Laterality Date  . CARDIAC CATHETERIZATION N/A  01/14/2015   Procedure: Left Heart Cath and Coronary Angiography;  Surgeon: Burnell Blanks, MD;  Location: [facility];  Service: Cardiovascular;  Laterality: N/A;  . HAND SURGERY    . [place]   bilateral herniorrphoes  . LEFT HEART CATH AND CORONARY ANGIOGRAPHY N/A 08/14/2019   Procedure: LEFT HEART CATH AND CORONARY ANGIOGRAPHY;  Surgeon: Burnell Blanks, MD;  Location: [facility];  Service: Cardiovascular;  Laterality: N/A;  . PROSTATE BIOPSY  08/10/13   gleason 3+3=6, vol 28.67 cc    Current Outpatient Medications  Medication Sig Dispense Refill  . amLODipine (NORVASC) 5 MG tablet Take 5 mg by mouth daily.    Marland Kitchen aspirin EC 81 MG tablet Take 81 mg by mouth daily.    . carvedilol (COREG) 6.25 MG tablet Take 6.25 mg by mouth 2 (two) times daily with a meal.     . Coenzyme Q10 200 MG capsule Take 200 mg by mouth daily.    . fish oil-omega-3 fatty acids 1000 MG capsule Take 1 g by mouth daily.     Marland Kitchen glimepiride (AMARYL) 4 MG tablet Take 4 mg by mouth 2 (two) times daily.     . isosorbide mononitrate (IMDUR) 60 MG 24 hr tablet Take 0.5 tablets (30 mg total) by mouth daily. 45 tablet 2  . JANUMET 50-500 MG per tablet Take 1  tablet by mouth daily.  0  . lisinopril (ZESTRIL) 5 MG tablet Take 5 mg by mouth daily.    . Multiple Vitamins-Minerals (MULTIVITAMIN WITH MINERALS) tablet Take 1 tablet by mouth daily.    . naproxen sodium (ANAPROX) 220 MG tablet Take 440 mg by mouth 2 (two) times daily as needed (back pain).    . nitroGLYCERIN (NITROSTAT) 0.4 MG SL tablet Place 1 tablet (0.4 mg total) under the tongue every 5 (five) minutes as needed for chest pain. 25 tablet 6  . rosuvastatin (CRESTOR) 5 MG tablet Take 5 mg by mouth daily.    . vitamin B-12 (CYANOCOBALAMIN) 1000 MCG tablet Take 1,000 mcg by mouth daily.    . rosuvastatin (CRESTOR) 10 MG tablet Take 1 tablet (10 mg total) by mouth daily. 90 tablet 3   No current  facility-administered medications for this visit.    Allergies  Allergen Reactions  . Vicodin [Hydrocodone-Acetaminophen] Other (See Comments)    dizzy and sick feeling/Knocks him out    Social History   Socioeconomic History  . Marital status: Married    Spouse name: Not on file  . Number of children: Not on file  . Years of education: Not on file  . Highest education level: Not on file  Occupational History  . Not on file  Tobacco Use  . Smoking status: Never Smoker  . Smokeless tobacco: Never Used  Vaping Use  . Vaping Use: Never used  Substance and Sexual Activity  . Alcohol use: Yes    Comment: ocasionally  . Drug use: No  . Sexual activity: Not on file  Other Topics Concern  . Not on file  Social History Narrative  . Not on file   Social Determinants of Health   Financial Resource Strain:   . Difficulty of Paying Living Expenses: Not on file  Food Insecurity:   . Worried About Charity fundraiser in the Last Year: Not on file  . Ran Out of Food in the Last Year: Not on file  Transportation Needs:   . Lack of Transportation (Medical): Not on file  . Lack of Transportation (Non-Medical): Not on file  Physical Activity:   . Days of Exercise per Week: Not on file  . Minutes of Exercise per Session: Not on file  Stress:   . Feeling of Stress : Not on file  Social Connections:   . Frequency of Communication with Friends and Family: Not on file  . Frequency of Social Gatherings with Friends and Family: Not on file  . Attends Religious Services: Not on file  . Active Member of Clubs or Organizations: Not on file  . Attends Archivist Meetings: Not on file  . Marital Status: Not on file  Intimate Partner Violence:   . Fear of Current or Ex-Partner: Not on file  . Emotionally Abused: Not on file  . Physically Abused: Not on file  . Sexually Abused: Not on file    Family History  Problem Relation Age of Onset  . Diabetes Mother   . Stroke Mother    . Diabetes Sister   . Heart attack Sister   . Diabetes Brother   . Cancer Father        larynx  . Stroke Brother     Review of Systems:  As stated in the HPI and otherwise negative.   BP 122/68   Pulse 68   Ht 5\' 11"  (1.803 m)   Wt 174 lb (78.9 kg)  SpO2 98%   BMI 24.27 kg/m   Physical Examination: General: Well developed, well nourished, NAD  HEENT: OP clear, mucus membranes moist  SKIN: warm, dry. No rashes. Neuro: No focal deficits  Musculoskeletal: Muscle strength 5/5 all ext  Psychiatric: Mood and affect normal  Neck: No JVD, no carotid bruits, no thyromegaly, no lymphadenopathy.  Lungs:Clear bilaterally, no wheezes, rhonci, crackles Cardiovascular: Regular rate and rhythm. No murmurs, gallops or rubs. Abdomen:Soft. Bowel sounds present. Non-tender.  Extremities: No lower extremity edema. Pulses are 2 + in the bilateral DP/PT.  EKG:  EKG is not ordered today. The ekg ordered today demonstrates   Recent Labs: 12/06/2019: ALT 14 02/03/2020: BUN 18; Creatinine, Ser 0.97; Hemoglobin 15.1; Platelets 238; Potassium 4.3; Sodium 140   Lipid Panel    Component Value Date/Time   CHOL 142 12/06/2019 0831   TRIG 85 12/06/2019 0831   HDL 57 12/06/2019 0831   CHOLHDL 2.5 12/06/2019 0831   CHOLHDL 3 03/18/2009 0902   VLDL 10.4 03/18/2009 0902   LDLCALC 69 12/06/2019 0831   LDLDIRECT 148.4 12/28/2007 1545     Wt Readings from Last 3 Encounters:  06/06/20 174 lb (78.9 kg)  09/05/19 176 lb (79.8 kg)  08/14/19 175 lb (79.4 kg)     Other studies Reviewed: Additional studies/ records that were reviewed today include: . Review of the above records demonstrates:    Assessment and Plan:   1. CAD with stable angina: No chest pain. Continue ASA, beta blocker, Imdur and statin.   2. HTN: BP is well controlled. Continue current meds.   3. HLD: Lipids are followed in primary care. LDL at goal in April 2021. Will continue statin.    Current medicines are reviewed at length  with the patient today.  The patient does not have concerns regarding medicines.  The following changes have been made:  no change  Labs/ tests ordered today include:   No orders of the defined types were placed in this encounter.   Disposition:   FU   Signed, Lauree Chandler, MD 06/06/2020 11:14 AM    [facility] [address], [place], [place]  [address] Phone: [phone]; Fax: [phone]

## 2020-06-06 ENCOUNTER — Ambulatory Visit: Payer: Medicare Other | Admitting: Cardiovascular Disease

## 2020-06-06 ENCOUNTER — Other Ambulatory Visit: Payer: Self-pay

## 2020-06-06 ENCOUNTER — Encounter: Payer: Self-pay | Admitting: Cardiovascular Disease

## 2020-06-06 VITALS — BP 122/68 | HR 68 | Ht 71.0 in | Wt 174.0 lb

## 2020-06-06 DIAGNOSIS — I10 Essential (primary) hypertension: Secondary | ICD-10-CM

## 2020-06-06 DIAGNOSIS — E785 Hyperlipidemia, unspecified: Secondary | ICD-10-CM

## 2020-06-06 DIAGNOSIS — I25119 Atherosclerotic heart disease of native coronary artery with unspecified angina pectoris: Secondary | ICD-10-CM

## 2020-06-06 NOTE — Patient Instructions (Signed)
Medication Instructions:  Your physician recommends that you continue on your current medications as directed. Please refer to the Current Medication list given to you today.  Labwork: None ordered.  Testing/Procedures: None ordered.  Follow-Up: Your physician recommends that you schedule a follow-up appointment in:   12 months with Dr. McAlhaney    Any Other Special Instructions Will Be Listed Below (If Applicable).     If you need a refill on your cardiac medications before your next appointment, please call your pharmacy.  

## 2020-06-29 ENCOUNTER — Ambulatory Visit: Payer: Medicare Other | Attending: Internal Medicine

## 2020-06-29 DIAGNOSIS — Z23 Encounter for immunization: Secondary | ICD-10-CM

## 2020-06-29 NOTE — Progress Notes (Signed)
   Covid-19 Vaccination Clinic  Name:  CHARLTON BOULE    MRN: [id number] DOB: February 28, 1946  06/29/2020  Mr. Heasley was observed post Covid-19 immunization for 15 minutes without incident. He was provided with Vaccine Information Sheet and instruction to access the V-Safe system.   Mr. Howland was instructed to call 911 with any severe reactions post vaccine: Marland Kitchen Difficulty breathing  . Swelling of face and throat  . A fast heartbeat  . A bad rash all over body  . Dizziness and weakness

## 2020-10-14 ENCOUNTER — Other Ambulatory Visit: Payer: Self-pay | Admitting: Physician Assistant

## 2020-10-28 ENCOUNTER — Other Ambulatory Visit: Payer: Self-pay | Admitting: Cardiovascular Disease

## 2021-02-10 ENCOUNTER — Other Ambulatory Visit: Payer: Self-pay

## 2021-02-10 ENCOUNTER — Ambulatory Visit: Payer: Medicare Other | Attending: Internal Medicine

## 2021-02-10 ENCOUNTER — Other Ambulatory Visit (HOSPITAL_BASED_OUTPATIENT_CLINIC_OR_DEPARTMENT_OTHER): Payer: Self-pay

## 2021-02-10 DIAGNOSIS — Z23 Encounter for immunization: Secondary | ICD-10-CM

## 2021-02-10 MED ORDER — PFIZER-BIONT COVID-19 VAC-TRIS 30 MCG/0.3ML IM SUSP
INTRAMUSCULAR | 0 refills | Status: DC
  Filled 2021-02-10: qty 0.3, 1d supply, fill #0

## 2021-02-10 NOTE — Progress Notes (Signed)
   Covid-19 Vaccination Clinic  Name:  Seth Guzman    MRN: [id number] DOB: Jul 13, 1946  02/10/2021  [name] was observed post Covid-19 immunization for 15 minutes without incident. He was provided with Vaccine Information Sheet and instruction to access the V-Safe system.   [name] was instructed to call 911 with any severe reactions post vaccine: Difficulty breathing  Swelling of face and throat  A fast heartbeat  A bad rash all over body  Dizziness and weakness   Immunizations Administered     Name Date Dose VIS Date Route   PFIZER Comrnaty(Gray TOP) Covid-19 Vaccine 02/10/2021  2:08 PM 0.3 mL 08/08/2020 Intramuscular   Manufacturer: [place]   Lot: [id number]   [place]: [phone]

## 2021-02-18 ENCOUNTER — Other Ambulatory Visit (HOSPITAL_BASED_OUTPATIENT_CLINIC_OR_DEPARTMENT_OTHER): Payer: Self-pay

## 2021-09-08 ENCOUNTER — Other Ambulatory Visit: Payer: Self-pay

## 2021-09-08 ENCOUNTER — Ambulatory Visit: Payer: Medicare Other | Attending: Internal Medicine

## 2021-09-08 ENCOUNTER — Other Ambulatory Visit (HOSPITAL_BASED_OUTPATIENT_CLINIC_OR_DEPARTMENT_OTHER): Payer: Self-pay

## 2021-09-08 DIAGNOSIS — Z23 Encounter for immunization: Secondary | ICD-10-CM

## 2021-09-08 MED ORDER — PFIZER COVID-19 VAC BIVALENT 30 MCG/0.3ML IM SUSP
INTRAMUSCULAR | 0 refills | Status: DC
  Filled 2021-09-08: qty 0.3, 1d supply, fill #0

## 2021-09-08 NOTE — Progress Notes (Signed)
° °  Covid-19 Vaccination Clinic  Name:  ARIZONA NORDQUIST    MRN: [id number] DOB: [birth year]  09/08/2021  Mr. Menn was observed post Covid-19 immunization for 15 minutes without incident. He was provided with Vaccine Information Sheet and instruction to access the V-Safe system.   Mr. Chalfin was instructed to call 911 with any severe reactions post vaccine: Difficulty breathing  Swelling of face and throat  A fast heartbeat  A bad rash all over body  Dizziness and weakness   Immunizations Administered     Name Date Dose VIS Date Route   Pfizer Covid-19 Vaccine Bivalent Booster 09/08/2021  4:10 PM 0.3 mL 04/30/2021 Intramuscular   Manufacturer: [place]   Lot: [id number]   [place]: [phone]

## 2021-09-10 ENCOUNTER — Ambulatory Visit: Payer: Medicare Other | Admitting: Cardiovascular Disease

## 2021-09-10 NOTE — Progress Notes (Deleted)
No chief complaint on file.  History of Present Illness: 76 yo male with history of diabetes mellitus, hypertension, hyperlipidemia, prostate cancer, seizures, arthritis, anxiety/depression, gout, ischemic cardiomyopathy and coronary artery disease who is here today for cardiac follow up. Cardiac cath in 2006 with moderate non-obstructive CAD. Stress myoview 09/28/14 was an intermediate risk study with fixed defect and normal wall motion in that area, LVEF=44%. No clear ischemia. Cardiac cath May 2016 with severe disease in the distal LAD but vessel too small for PCI. LVEF=45-50%. He had worsened chest pain in December 2020 and cardiac cath was repeated. This showed stable CAD with severe disease in the distal LAD not felt to be favorable for PCI. He was started on Imdur.   He is here today for follow up. The patient denies any chest pain, dyspnea, palpitations, lower extremity edema, orthopnea, PND, dizziness, near syncope or syncope.   Primary Care Physician: [name], MD  Past Medical History:  Diagnosis Date   Anxiety    Arthritis    CAD in native artery    a. cath 2006 - mod CAD, med rx. b. cath 2016 -  20% prox-mid RCA, 30% ost-prox LAD, 30% mLAd, 99% distal LAD-2, 90% distal LAD-1, 50% D2, 70% D3 -> the LAD disease was small caliber/apical and too small for PCI. EF was 45-50%   Depression    Diabetes mellitus type 2 in nonobese (HCC)    GERD (gastroesophageal reflux disease)    Gout    Heart murmur    HTN (hypertension)    Hx of radiation therapy 01/08/14- 03/01/14   prostate 7600 cGy 38 sessions   Hyperlipidemia    Inguinal hernia    recurrent right inguinal hernia   Ischemic cardiomyopathy    a. EF 45-50% by cath 2016.   Meatal stenosis 1966, 1967   Prostate cancer ([place]) 08/10/13   Gleason 6, volume 28.67 cc   Seizures (HCC)     Past Surgical History:  Procedure Laterality Date   CARDIAC CATHETERIZATION N/A 01/14/2015   Procedure: Left Heart Cath and Coronary  Angiography;  Surgeon: Burnell Blanks, MD;  Location: [facility];  Service: Cardiovascular;  Laterality: N/A;   HAND SURGERY     INGUINAL HERNIA REPAIR  1966, 1967   bilateral herniorrphoes   LEFT HEART CATH AND CORONARY ANGIOGRAPHY N/A 08/14/2019   Procedure: LEFT HEART CATH AND CORONARY ANGIOGRAPHY;  Surgeon: Burnell Blanks, MD;  Location: [facility];  Service: Cardiovascular;  Laterality: N/A;   PROSTATE BIOPSY  08/10/13   gleason 3+3=6, vol 28.67 cc    Current Outpatient Medications  Medication Sig Dispense Refill   amLODipine (NORVASC) 5 MG tablet Take 5 mg by mouth daily.     aspirin EC 81 MG tablet Take 81 mg by mouth daily.     carvedilol (COREG) 6.25 MG tablet Take 6.25 mg by mouth 2 (two) times daily with a meal.      Coenzyme Q10 200 MG capsule Take 200 mg by mouth daily.     COVID-19 mRNA bivalent vaccine, Pfizer, (PFIZER COVID-19 VAC BIVALENT) injection Inject into the muscle. 0.3 mL 0   COVID-19 mRNA Vac-TriS, Pfizer, (PFIZER-BIONT COVID-19 VAC-TRIS) SUSP injection Inject into the muscle. 0.3 mL 0   fish oil-omega-3 fatty acids 1000 MG capsule Take 1 g by mouth daily.      glimepiride (AMARYL) 4 MG tablet Take 4 mg by mouth 2 (two) times daily.      isosorbide  mononitrate (IMDUR) 60 MG 24 hr tablet TAKE 1/2 TABLET(30 MG) BY MOUTH DAILY 45 tablet 3   JANUMET 50-500 MG per tablet Take 1 tablet by mouth daily.  0   lisinopril (ZESTRIL) 5 MG tablet Take 5 mg by mouth daily.     Multiple Vitamins-Minerals (MULTIVITAMIN WITH MINERALS) tablet Take 1 tablet by mouth daily.     naproxen sodium (ANAPROX) 220 MG tablet Take 440 mg by mouth 2 (two) times daily as needed (back pain).     nitroGLYCERIN (NITROSTAT) 0.4 MG SL tablet Place 1 tablet (0.4 mg total) under the tongue every 5 (five) minutes as needed for chest pain. 25 tablet 6   rosuvastatin (CRESTOR) 10 MG tablet TAKE 1 TABLET(10 MG) BY MOUTH DAILY 90 tablet 2   rosuvastatin (CRESTOR) 5 MG tablet  Take 5 mg by mouth daily.     vitamin B-12 (CYANOCOBALAMIN) 1000 MCG tablet Take 1,000 mcg by mouth daily.     No current facility-administered medications for this visit.    Allergies  Allergen Reactions   Vicodin [Hydrocodone-Acetaminophen] Other (See Comments)    dizzy and sick feeling/Knocks him out    Social History   Socioeconomic History   Marital status: Married    Spouse name: Not on file   Number of children: Not on file   Years of education: Not on file   Highest education level: Not on file  Occupational History   Not on file  Tobacco Use   Smoking status: Never   Smokeless tobacco: Never  Vaping Use   Vaping Use: Never used  Substance and Sexual Activity   Alcohol use: Yes    Comment: ocasionally   Drug use: No   Sexual activity: Not on file  Other Topics Concern   Not on file  Social History Narrative   Not on file   Social Determinants of Health   Financial Resource Strain: Not on file  Food Insecurity: Not on file  Transportation Needs: Not on file  Physical Activity: Not on file  Stress: Not on file  Social Connections: Not on file  Intimate Partner Violence: Not on file    Family History  Problem Relation Age of Onset   Diabetes Mother    Stroke Mother    Diabetes Sister    Heart attack Sister    Diabetes Brother    Cancer Father        larynx   Stroke Brother     Review of Systems:  As stated in the HPI and otherwise negative.   There were no vitals taken for this visit.  Physical Examination: General: Well developed, well nourished, NAD  HEENT: OP clear, mucus membranes moist  SKIN: warm, dry. No rashes. Neuro: No focal deficits  Musculoskeletal: Muscle strength 5/5 all ext  Psychiatric: Mood and affect normal  Neck: No JVD, no carotid bruits, no thyromegaly, no lymphadenopathy.  Lungs:Clear bilaterally, no wheezes, rhonci, crackles Cardiovascular: Regular rate and rhythm. No murmurs, gallops or rubs. Abdomen:Soft. Bowel  sounds present. Non-tender.  Extremities: No lower extremity edema. Pulses are 2 + in the bilateral DP/PT.  EKG:  EKG is not *** ordered today. The ekg ordered today demonstrates   Recent Labs: No results found for requested labs within last 8760 hours.   Lipid Panel    Component Value Date/Time   CHOL 142 12/06/2019 0831   TRIG 85 12/06/2019 0831   HDL 57 12/06/2019 0831   CHOLHDL 2.5 12/06/2019 0831   CHOLHDL 3 03/18/2009  0902   VLDL 10.4 03/18/2009 0902   LDLCALC 69 12/06/2019 0831   LDLDIRECT 148.4 12/28/2007 1545     Wt Readings from Last 3 Encounters:  06/06/20 174 lb (78.9 kg)  09/05/19 176 lb (79.8 kg)  08/14/19 175 lb (79.4 kg)     Other studies Reviewed: Additional studies/ records that were reviewed today include: . Review of the above records demonstrates:    Assessment and Plan:   1. CAD with stable angina: No chest pain Imdur. Will continue ASA, statin, Imdur and beta blocker.    2. HTN: BP is controlled. No changes  3. HLD: Lipids are followed in primary care. LDL ***. Continue statin.     Current medicines are reviewed at length with the patient today.  The patient does not have concerns regarding medicines.  The following changes have been made:  no change  Labs/ tests ordered today include:   No orders of the defined types were placed in this encounter.   Disposition:   FU   Signed, Lauree Chandler, MD 09/10/2021 8:30 AM    [facility], [place], [place]  [address] Phone: [phone]; Fax: [phone]

## 2021-09-19 ENCOUNTER — Other Ambulatory Visit: Payer: Self-pay

## 2021-09-19 MED ORDER — ROSUVASTATIN CALCIUM 10 MG PO TABS: ORAL_TABLET | ORAL | 0 refills | Status: DC

## 2021-12-01 ENCOUNTER — Other Ambulatory Visit: Payer: Self-pay | Admitting: *Deleted

## 2021-12-01 MED ORDER — ROSUVASTATIN CALCIUM 10 MG PO TABS: ORAL_TABLET | ORAL | 0 refills | Status: DC

## 2021-12-17 ENCOUNTER — Other Ambulatory Visit: Payer: Self-pay | Admitting: Endocrinology

## 2021-12-17 ENCOUNTER — Ambulatory Visit
Admission: RE | Admit: 2021-12-17 | Discharge: 2021-12-17 | Disposition: A | Discharge status: Home / Self Care | Payer: Medicare Other | Source: Ambulatory Visit | Attending: Endocrinology | Admitting: Endocrinology

## 2021-12-17 DIAGNOSIS — G459 Transient cerebral ischemic attack, unspecified: Secondary | ICD-10-CM

## 2021-12-18 ENCOUNTER — Encounter (HOSPITAL_BASED_OUTPATIENT_CLINIC_OR_DEPARTMENT_OTHER): Payer: Self-pay | Admitting: Family

## 2021-12-18 ENCOUNTER — Ambulatory Visit (HOSPITAL_BASED_OUTPATIENT_CLINIC_OR_DEPARTMENT_OTHER): Payer: Medicare Other | Admitting: Family

## 2021-12-18 VITALS — BP 124/66 | HR 66 | Ht 71.0 in | Wt 171.0 lb

## 2021-12-18 DIAGNOSIS — E785 Hyperlipidemia, unspecified: Secondary | ICD-10-CM

## 2021-12-18 DIAGNOSIS — I10 Essential (primary) hypertension: Secondary | ICD-10-CM

## 2021-12-18 DIAGNOSIS — I25118 Atherosclerotic heart disease of native coronary artery with other forms of angina pectoris: Secondary | ICD-10-CM

## 2021-12-18 MED ORDER — ROSUVASTATIN CALCIUM 10 MG PO TABS: ORAL_TABLET | ORAL | 3 refills | Status: DC

## 2021-12-18 MED ORDER — ISOSORBIDE MONONITRATE ER 60 MG PO TB24: ORAL_TABLET | ORAL | 3 refills | Status: DC

## 2021-12-18 NOTE — Progress Notes (Signed)
? ?Office Visit  ?  ?Patient Name: Seth Guzman ?Date of Encounter: 12/18/2021 ? ?PCP:  [name], MD ?  ?[place]  ?Cardiologist:  Lauree Chandler, MD  ?Advanced Practice Provider:  No care team member to display ?Electrophysiologist:  None  ?   ? ?Chief Complaint  ?  ?Seth Guzman is a 76 y.o. male with a hx of  DM 2, hypertension, hyperlipidemia, prostate cancer, seizure, arthritis, anxiety/depression, gout, ischemic cardiomyopathy, coronary artery disease presents today for follow up of coronary disease  ? ?Past Medical History  ?  ?Past Medical History:  ?Diagnosis Date  ? Anxiety   ? Arthritis   ? CAD in native artery   ? a. cath 2006 - mod CAD, med rx. b. cath 2016 -  20% prox-mid RCA, 30% ost-prox LAD, 30% mLAd, 99% distal LAD-2, 90% distal LAD-1, 50% D2, 70% D3 -> the LAD disease was small caliber/apical and too small for PCI. EF was 45-50%  ? Depression   ? Diabetes mellitus type 2 in nonobese [facility])   ? GERD (gastroesophageal reflux disease)   ? Gout   ? Heart murmur   ? HTN (hypertension)   ? Hx of radiation therapy 01/08/14- 03/01/14  ? prostate 7600 cGy 38 sessions  ? Hyperlipidemia   ? Inguinal hernia   ? recurrent right inguinal hernia  ? Ischemic cardiomyopathy   ? a. EF 45-50% by cath 2016.  ? Meatal stenosis 1966, 1967  ? Prostate cancer ([place]) 08/10/13  ? Gleason 6, volume 28.67 cc  ? Seizures ([place])   ? ?Past Surgical History:  ?Procedure Laterality Date  ? CARDIAC CATHETERIZATION N/A 01/14/2015  ? Procedure: Left Heart Cath and Coronary Angiography;  Surgeon: Burnell Blanks, MD;  Location: [facility];  Service: Cardiovascular;  Laterality: N/A;  ? HAND SURGERY    ? [place]  ? bilateral herniorrphoes  ? LEFT HEART CATH AND CORONARY ANGIOGRAPHY N/A 08/14/2019  ? Procedure: LEFT HEART CATH AND CORONARY ANGIOGRAPHY;  Surgeon: Burnell Blanks, MD;  Location: [facility];  Service: Cardiovascular;  Laterality:  N/A;  ? PROSTATE BIOPSY  08/10/13  ? gleason 3+3=6, vol 28.67 cc  ? ? ?Allergies ? ?Allergies  ?Allergen Reactions  ? Vicodin [Hydrocodone-Acetaminophen] Other (See Comments)  ?  dizzy and sick feeling/Knocks him out  ? ? ?History of Present Illness  ?  ?Seth Guzman is a 76 y.o. male with a hx of DM 2, hypertension, hyperlipidemia, prostate cancer, seizure, arthritis, anxiety/depression, gout, ischemic cardiomyopathy, coronary artery disease last seen 05/2020 by Dr. Angelena Form. ? ?Prior cardiac catheterization in 2006 with moderate nonobstructive coronary disease.  Stress Myoview January 2016 intermediate risk study with fixed defect and normal wall motion in that area with LVEF 44%.  Cardiac cath May 2016 with severe disease in the distal LAD but vessel too small for PCI with LVEF 45-50% at that time.  He had worsening chest pain December 2020 cardiac cath repeated showing stable coronary disease with severe disease in the distal LAD not felt to be favorable for PCI.  Imdur was initiated.  Last known clinical October 2021 doing overall well from a cardiac perspective and no changes were made at that time. ? ?He presents today for follow-up.  Very pleasant gentleman who enjoys spending time with his wife of 31 years as well as his grandchildren who are 25 and 36 years old.  Notes that when he is  pushing trash cans out to the street or mowing he will get a bit of chest pain that then resolves after 5-15 minutes. This is occurring periodically but probably more often than when last seen a year ago.  BP at home most often in the 140s when checked with his home arm cuff. ?EKGs/Labs/Other Studies Reviewed:  ? ?The following studies were reviewed today: ? ?Cardiac catheterization 08/2019 ?Ost LAD to Prox LAD lesion is 30% stenosed. ?Mid LAD lesion is 30% stenosed. ?Dist LAD-1 lesion is 90% stenosed. ?Dist LAD-2 lesion is 99% stenosed. ?2nd Diag lesion is 50% stenosed. ?Ost 3rd Diag to 3rd Diag lesion is 70% stenosed. ?Prox  RCA lesion is 20% stenosed. ?Mid RCA lesion is 40% stenosed. ?Ramus lesion is 30% stenosed. ?  ?1. The LAD course to the apex. The LAD becomes a small caliber vessel distally. The distal and apical LAD has diffuse, severe disease, unchanged form last cath and too small for PCI.  ?2. Diffuse disease in the small caliber diagonal branch, too small for PCI. Unchanged from last cath ?3. Mild non-obstructive disease in the large ramus intermediate branch ?4. Mild non-obstructive disease in the mid RCA ?5. Overall preserved LV systolic function with mild hypokinesis of the apex, unchanged from last cath  ?  ?EKG:  EKG is ordered today.  The ekg ordered today demonstrates NSR 66 bpm with first degree AV block PR 244.  ? ?Recent Labs: ?No results found for requested labs within last 8760 hours.  ?Recent Lipid Panel ?   ?Component Value Date/Time  ? CHOL 142 12/06/2019 0831  ? TRIG 85 12/06/2019 0831  ? HDL 57 12/06/2019 0831  ? CHOLHDL 2.5 12/06/2019 0831  ? CHOLHDL 3 03/18/2009 0902  ? VLDL 10.4 03/18/2009 0902  ? [place] 69 12/06/2019 0831  ? LDLDIRECT 148.4 12/28/2007 1545  ? ?Home Medications  ? ?Current Meds  ?Medication Sig  ? amLODipine (NORVASC) 5 MG tablet Take 5 mg by mouth daily.  ? aspirin EC 81 MG tablet Take 81 mg by mouth daily.  ? carvedilol (COREG) 6.25 MG tablet Take 6.25 mg by mouth 2 (two) times daily with a meal.   ? Coenzyme Q10 200 MG capsule Take 200 mg by mouth daily.  ? COVID-19 mRNA bivalent vaccine, Pfizer, (PFIZER COVID-19 VAC BIVALENT) injection Inject into the muscle.  ? COVID-19 mRNA Vac-TriS, Pfizer, (PFIZER-BIONT COVID-19 VAC-TRIS) SUSP injection Inject into the muscle.  ? fish oil-omega-3 fatty acids 1000 MG capsule Take 1 g by mouth daily.   ? glimepiride (AMARYL) 4 MG tablet Take 4 mg by mouth 2 (two) times daily.   ? JANUMET 50-500 MG per tablet Take 1 tablet by mouth daily.  ? Multiple Vitamins-Minerals (MULTIVITAMIN WITH MINERALS) tablet Take 1 tablet by mouth daily.  ? naproxen  sodium (ANAPROX) 220 MG tablet Take 440 mg by mouth 2 (two) times daily as needed (back pain).  ? nitroGLYCERIN (NITROSTAT) 0.4 MG SL tablet Place 1 tablet (0.4 mg total) under the tongue every 5 (five) minutes as needed for chest pain.  ? vitamin B-12 (CYANOCOBALAMIN) 1000 MCG tablet Take 1,000 mcg by mouth daily.  ? [DISCONTINUED] isosorbide mononitrate (IMDUR) 60 MG 24 hr tablet TAKE 1/2 TABLET(30 MG) BY MOUTH DAILY  ? [DISCONTINUED] lisinopril (ZESTRIL) 5 MG tablet Take 5 mg by mouth daily.  ? [DISCONTINUED] rosuvastatin (CRESTOR) 10 MG tablet TAKE 1 TABLET(10 MG) BY MOUTH DAILY. Please make overdue appt with Dr Angelena Form before anymore refills. Thank you 1st attempt  ?  ? ?  Review of Systems  ?    ?All other systems reviewed and are otherwise negative except as noted above. ? ?Physical Exam  ?  ?VS:  BP 124/66   Pulse 66   Ht '5\' 11"'$  (1.803 m)   Wt 171 lb (77.6 kg)   SpO2 97%   BMI 23.85 kg/m?  , BMI Body mass index is 23.85 kg/m?. ? ?Wt Readings from Last 3 Encounters:  ?12/18/21 171 lb (77.6 kg)  ?06/06/20 174 lb (78.9 kg)  ?09/05/19 176 lb (79.8 kg)  ?  ?GEN: Well nourished, well developed, in no acute distress. ?HEENT: normal. ?Neck: Supple, no JVD, carotid bruits, or masses. ?Cardiac: RRR, no murmurs, rubs, or gallops. No clubbing, cyanosis, edema.  Radials/PT 2+ and equal bilaterally.  ?Respiratory:  Respirations regular and unlabored, clear to auscultation bilaterally. ?GI: Soft, nontender, nondistended. ?MS: No deformity or atrophy. ?Skin: Warm and dry, no rash. ?Neuro:  Strength and sensation are intact. ?Psych: Normal affect. ? ?Assessment & Plan  ?  ?CAD -known distal and apical LAD with diffuse severe disease not amenable for PCI.  He has having increasing anginal symptoms though not requiring PRN nitroglycerin.. As such, increase Imdur to '60mg'$  daily. Continue Amlodipine for antianginal benefit. Future considerations include further up-titration of Imdur vs addition of Ranexa. Will not further  escalate Coreg due to first degree AV block on EKG today. Heart healthy diet and regular cardiovascular exercise encouraged.   ? ?Hypertension - BP at goal. Due to increasing angina, will increase Imdur to '60mg'$  QD

## 2021-12-18 NOTE — Patient Instructions (Signed)
Medication Instructions:  ?Your physician has recommended you make the following change in your medication:  ? ?Stop: Lisinopril  ? ?Change: Imdur '60mg'$  (whole tablet) daily  ? ? ?*If you need a refill on your cardiac medications before your next appointment, please call your pharmacy* ? ? ?Lab Work: ?Your physician recommends that you return for lab work in 2 months for Fasting Lipid Panel and CMET  ? ? ? You may come to the...  ? ?[facility] (3rd floor) ?[address], [place], [place]  ?Open: 8am-Noon and 1pm-4:30pm  ? ?[facility] ?[place]  ? ?[employer]- Any location ? ?**no appointments needed** ? ? ?If you have labs (blood work) drawn today and your tests are completely normal, you will receive your results only by: ?MyChart Message (if you have MyChart) OR ?A paper copy in the mail ?If you have any lab test that is abnormal or we need to change your treatment, we will call you to review the results. ? ? ?Testing/Procedures: ?EKG looked good today! ? ? ?Follow-Up: ?At [facility], you and your health needs are our priority.  As part of our continuing mission to provide you with exceptional heart care, we have created designated Provider Care Teams.  These Care Teams include your primary Cardiologist (physician) and Advanced Practice Providers (APPs -  Physician Assistants and Nurse Practitioners) who all work together to provide you with the care you need, when you need it. ? ?We recommend signing up for the patient portal called "MyChart".  Sign up information is provided on this After Visit Summary.  MyChart is used to connect with patients for Virtual Visits (Telemedicine).  Patients are able to view lab/test results, encounter notes, upcoming appointments, etc.  Non-urgent messages can be sent to your provider as well.   ?To learn more about what you can do with MyChart, go to [email or web address].   ? ?Your next appointment:    ?4-6 month(s) ? ?The format for your next appointment:   ?In Person ? ?Provider:   ?Lauree Chandler, MD { ? ?Other Instructions ?Heart Healthy Diet Recommendations: ?A low-salt diet is recommended. Meats should be grilled, baked, or boiled. Avoid fried foods. Focus on lean protein sources like fish or chicken with vegetables and fruits. The American Heart Association is a Microbiologist!  American Heart Association Diet and Lifeystyle Recommendations  ? ?Exercise recommendations: ?The American Heart Association recommends 150 minutes of moderate intensity exercise weekly. ?Try 30 minutes of moderate intensity exercise 4-5 times per week. ?This could include walking, jogging, or swimming. ? ? ?Important Information About Sugar ? ? ? ? ? ? ?

## 2022-02-18 LAB — COMPREHENSIVE METABOLIC PANEL
ALT: 13 IU/L (ref 0–44)
AST: 16 IU/L (ref 0–40)
Albumin/Globulin Ratio: 2.3 — ABNORMAL HIGH (ref 1.2–2.2)
Albumin: 4.6 g/dL (ref 3.7–4.7)
Alkaline Phosphatase: 81 IU/L (ref 44–121)
BUN/Creatinine Ratio: 13 (ref 10–24)
BUN: 13 mg/dL (ref 8–27)
Bilirubin Total: 0.3 mg/dL (ref 0.0–1.2)
CO2: 25 mmol/L (ref 20–29)
Calcium: 10.1 mg/dL (ref 8.6–10.2)
Chloride: 100 mmol/L (ref 96–106)
Creatinine, Ser: 0.97 mg/dL (ref 0.76–1.27)
Globulin, Total: 2 g/dL (ref 1.5–4.5)
Glucose: 216 mg/dL — ABNORMAL HIGH (ref 70–99)
Potassium: 5.6 mmol/L — ABNORMAL HIGH (ref 3.5–5.2)
Sodium: 140 mmol/L (ref 134–144)
Total Protein: 6.6 g/dL (ref 6.0–8.5)
eGFR: 80 mL/min/{1.73_m2} (ref >59)

## 2022-02-18 LAB — LIPID PANEL
Chol/HDL Ratio: 2.4 ratio (ref 0.0–5.0)
Cholesterol, Total: 170 mg/dL (ref 100–199)
HDL: 71 mg/dL (ref >39)
LDL Chol Calc (NIH): 72 mg/dL (ref 0–99)
Triglycerides: 164 mg/dL — ABNORMAL HIGH (ref 0–149)
VLDL Cholesterol Cal: 27 mg/dL (ref 5–40)

## 2022-02-23 ENCOUNTER — Telehealth (HOSPITAL_BASED_OUTPATIENT_CLINIC_OR_DEPARTMENT_OTHER): Payer: Self-pay

## 2022-02-23 DIAGNOSIS — I25118 Atherosclerotic heart disease of native coronary artery with other forms of angina pectoris: Secondary | ICD-10-CM

## 2022-02-23 DIAGNOSIS — E785 Hyperlipidemia, unspecified: Secondary | ICD-10-CM

## 2022-02-23 DIAGNOSIS — I10 Essential (primary) hypertension: Secondary | ICD-10-CM

## 2022-02-23 NOTE — Telephone Encounter (Addendum)
Call attempt #2, no answer at either number, unable to leave voicemail     ----- Message from Alver Sorrow, NP sent at 02/19/2022  7:47 AM EDT ----- Potassium mildly elevated-Ensure not taking potassium supplement, drinking electrolyte drink.  Not taking any medications that would elevate potassium.  Please make sure he stopped lisinopril as instructed at his last appointment.  Recommend repeat BMP Monday or Tuesday of next week for monitoring of potassium.  Normal liver, kidneys.  LDL (bad cholesterol) of 72 which is above goal of 70.  Recommend lifestyle changes with lipid-lowering diet and increasing physical activity.  Repeat lipid panel in 3 months for monitoring.

## 2022-02-23 NOTE — Telephone Encounter (Addendum)
3rd call attempt to all numbers on file, unable to leave voicemail. RN to mail results to patient with lab slips and instructions on when to have drawn    ----- Message from Alver Sorrow, NP sent at 02/19/2022  7:47 AM EDT ----- Potassium mildly elevated-Ensure not taking potassium supplement, drinking electrolyte drink.  Not taking any medications that would elevate potassium.  Please make sure he stopped lisinopril as instructed at his last appointment.  Recommend repeat BMP Monday or Tuesday of next week for monitoring of potassium.  Normal liver, kidneys.  LDL (bad cholesterol) of 72 which is above goal of 70.  Recommend lifestyle changes with lipid-lowering diet and increasing physical activity.  Repeat lipid panel in 3 months for monitoring.

## 2022-03-05 ENCOUNTER — Telehealth (HOSPITAL_BASED_OUTPATIENT_CLINIC_OR_DEPARTMENT_OTHER): Payer: Self-pay

## 2022-03-05 DIAGNOSIS — E785 Hyperlipidemia, unspecified: Secondary | ICD-10-CM

## 2022-03-05 LAB — BASIC METABOLIC PANEL
BUN/Creatinine Ratio: 16 (ref 10–24)
BUN: 14 mg/dL (ref 8–27)
CO2: 24 mmol/L (ref 20–29)
Calcium: 9.3 mg/dL (ref 8.6–10.2)
Chloride: 105 mmol/L (ref 96–106)
Creatinine, Ser: 0.89 mg/dL (ref 0.76–1.27)
Glucose: 195 mg/dL — ABNORMAL HIGH (ref 70–99)
Potassium: 4.6 mmol/L (ref 3.5–5.2)
Sodium: 143 mmol/L (ref 134–144)
eGFR: 88 mL/min/{1.73_m2} (ref >59)

## 2022-03-05 LAB — LIPID PANEL
Chol/HDL Ratio: 2.4 ratio (ref 0.0–5.0)
Cholesterol, Total: 169 mg/dL (ref 100–199)
HDL: 69 mg/dL (ref >39)
LDL Chol Calc (NIH): 85 mg/dL (ref 0–99)
Triglycerides: 80 mg/dL (ref 0–149)
VLDL Cholesterol Cal: 15 mg/dL (ref 5–40)

## 2022-03-05 MED ORDER — ROSUVASTATIN CALCIUM 20 MG PO TABS: 20.0000 mg | ORAL_TABLET | Freq: Every day | ORAL | 3 refills | Status: DC

## 2022-03-05 NOTE — Telephone Encounter (Addendum)
RN returned call to patient and provided the following recommendations. Patient verbalizes understanding. Labs ordered and mailed, prescriptions updated and sent to pharmacy on file.     ----- Message from Loel Dubonnet, NP sent at 03/05/2022  7:55 AM EDT ----- Normal kidney function, electrolytes. LDL not at goal of <70. Increase Crestor to '20mg'$  daily. Repeat FLP/CMP in 2 months.

## 2022-03-19 IMAGING — CT CT CERVICAL SPINE W/O CM
3 of 4 series · 12 of 33 positions shown, 14 images · non-contrast
Comparison: MRI of the cervical spine 08/28/2013, brain MRI
01/27/2004

CLINICAL DATA: Head trauma, headache.

EXAM:
CT HEAD WITHOUT CONTRAST
CT CERVICAL SPINE WITHOUT CONTRAST
TECHNIQUE: Multidetector CT imaging of the head and cervical spine was
performed following the standard protocol without intravenous
contrast. Multiplanar CT image reconstructions of the cervical spine
were also generated.

[Series 6: orthogonal bone · axial · 0.23mm/px · z∈[-329,-206]mm · 4 of 99 slices shown, 5 images]
[im 17/99  soft-tissue]
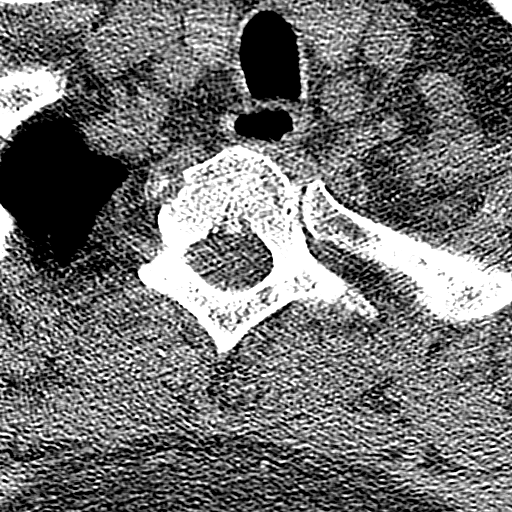
[im 17/99  bone]
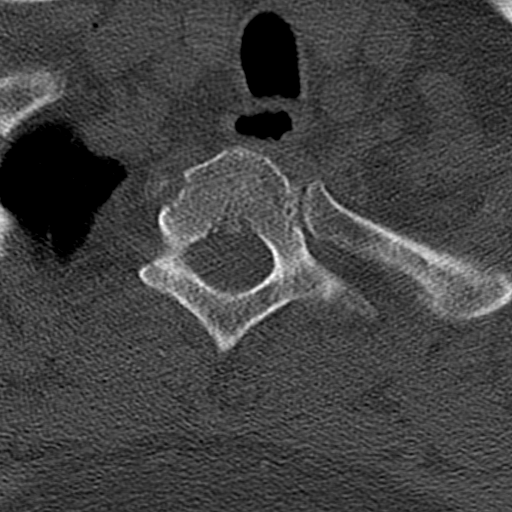
[im 33/99  bone]
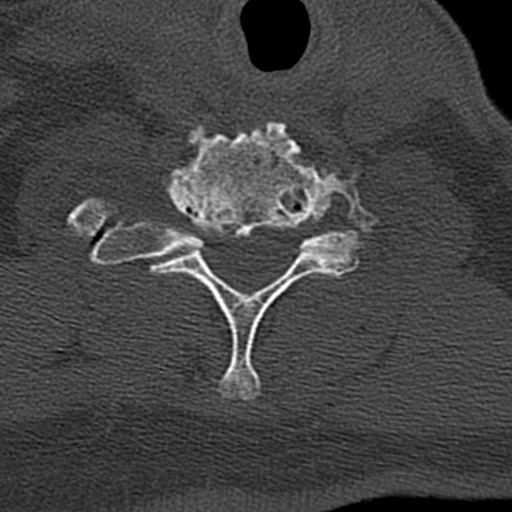
[im 66/99  bone]
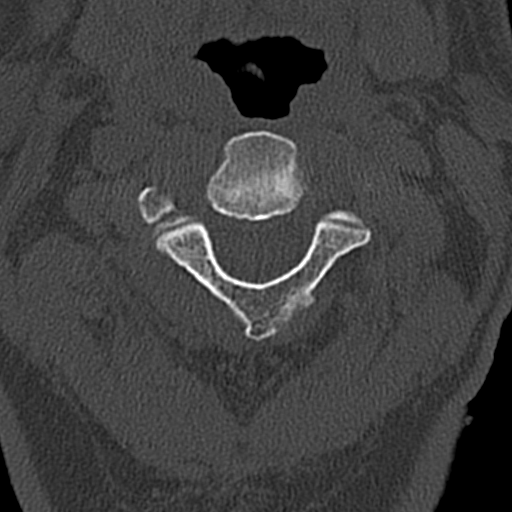
[im 82/99  bone]
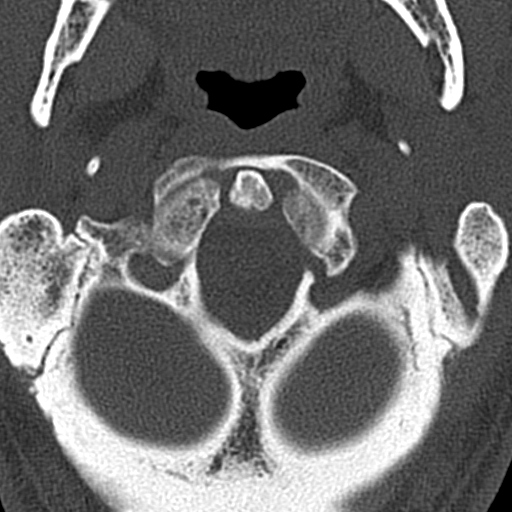

[Series 7: coronal bone · coronal · 0.27mm/px · 3 of 61 slices shown]
[im 13/61  bone]
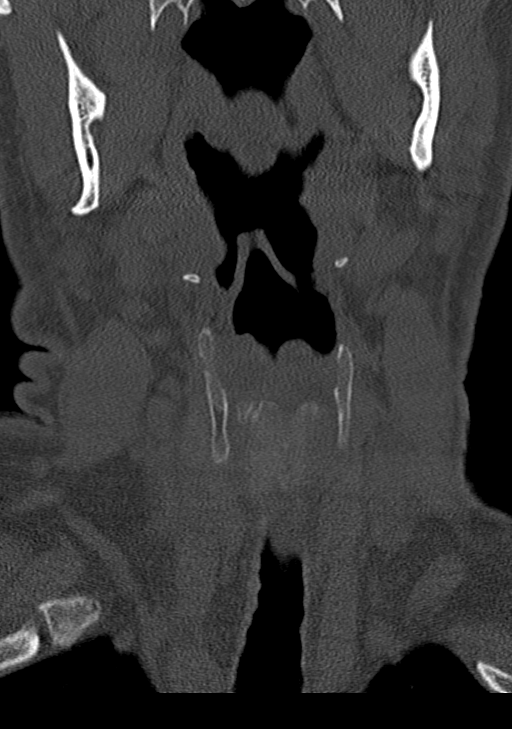
[im 25/61  bone]
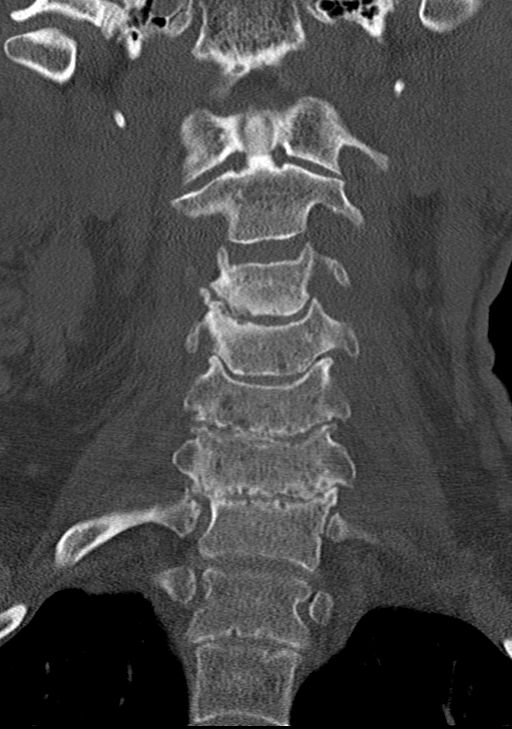
[im 37/61  bone]
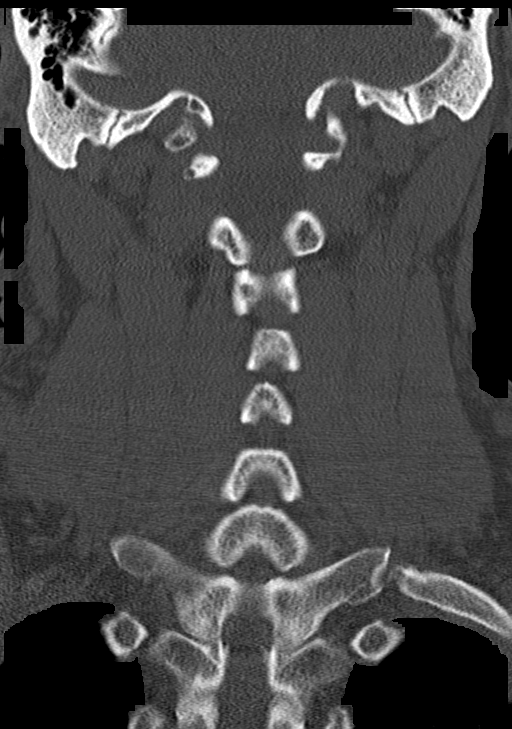

[Series 8: sagittal bone · sagittal · 0.27mm/px · 5 of 60 slices shown, 6 images]
[im 20/60  bone]
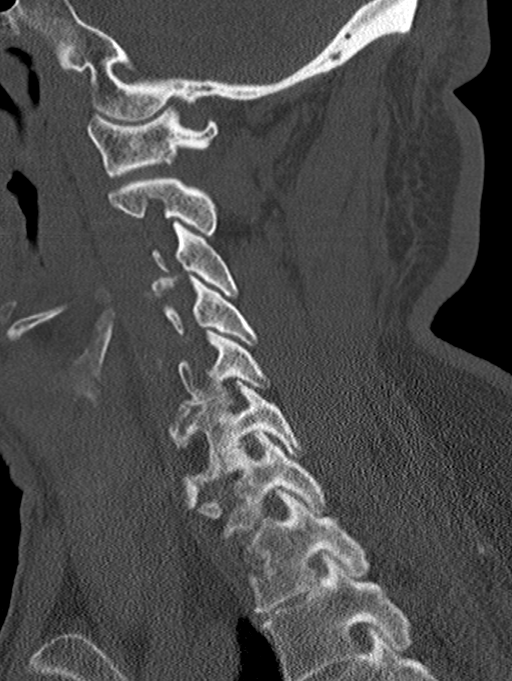
[im 25/60  bone]
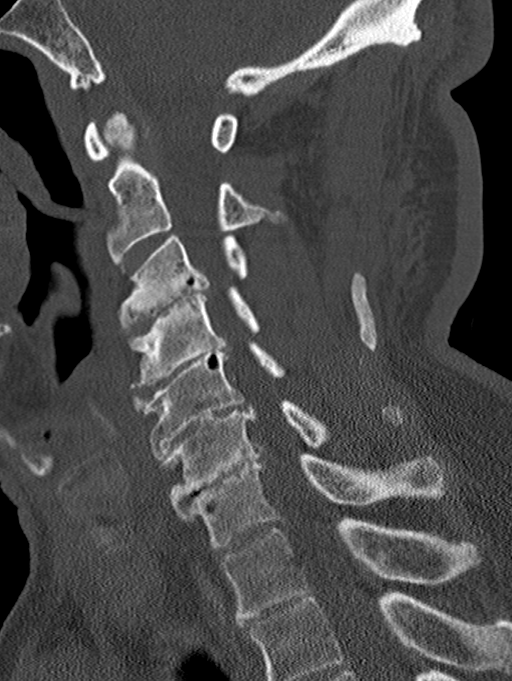
[im 30/60  soft-tissue]
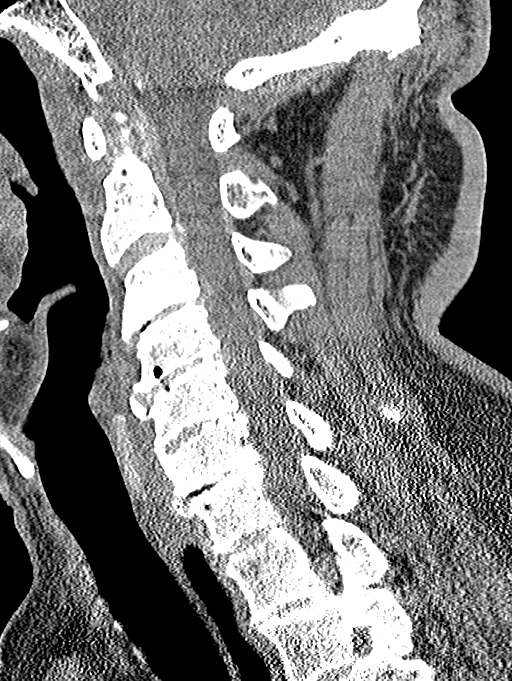
[im 30/60  bone]
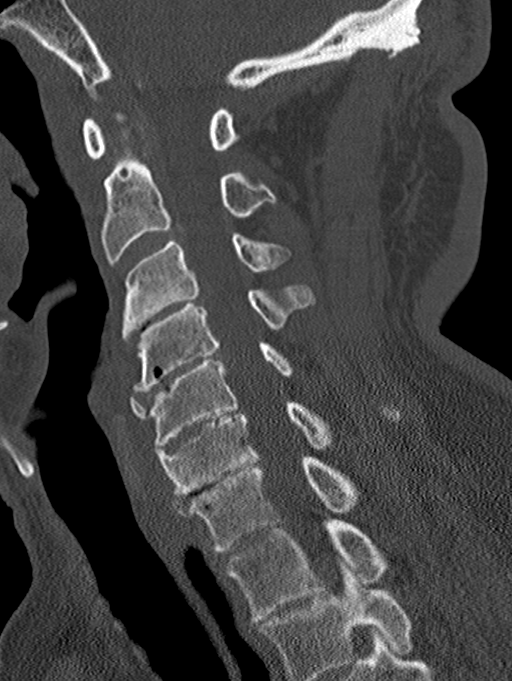
[im 35/60  bone]
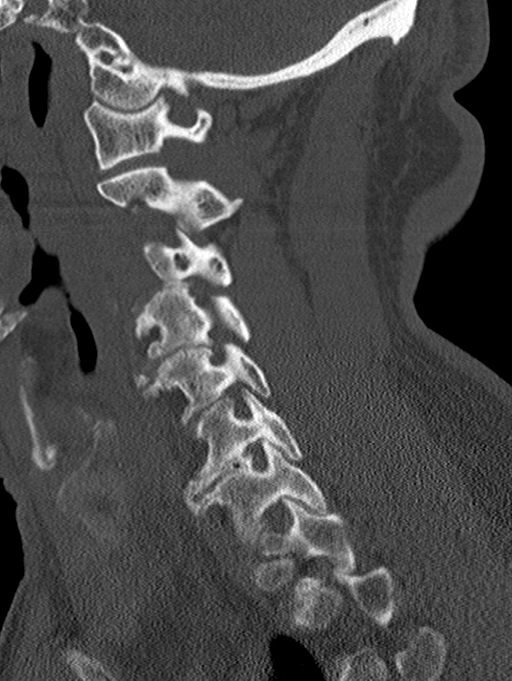
[im 40/60  bone]
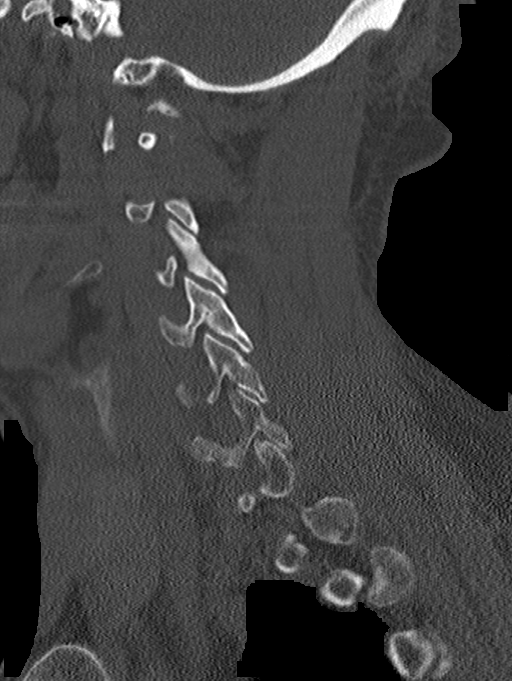

[12 of 33 positions shown; findings below may reference images not displayed]

FINDINGS: CT HEAD FINDINGS

Brain:

There is moderate to advanced ill-defined hypoattenuation within the
cerebral white matter and deep gray nuclei which is nonspecific, but
consistent with chronic small vessel ischemic disease.

Mild generalized parenchymal atrophy.

There is no acute intracranial hemorrhage.

No demarcated cortical infarct.

No extra-axial fluid collection.

No evidence of intracranial mass.

No midline shift.

Vascular: No hyperdense vessel.  Atherosclerotic calcifications.

Skull: Normal. Negative for fracture or focal lesion.

Sinuses/Orbits: Visualized orbits show no acute finding. Mild
ethmoid sinus mucosal thickening. No significant mastoid effusion.

Other: There is fairly diffuse scalp edema most notable posteriorly.

CT CERVICAL SPINE FINDINGS

Alignment: Straightening of the expected cervical lordosis. Mild
C3-C4 and C4-C5 grade 1 retrolisthesis.

Skull base and vertebrae: The basion-dental and atlanto-dental
intervals are maintained.No evidence of acute fracture to the
cervical spine.

Soft tissues and spinal canal: No prevertebral fluid or swelling. No
visible canal hematoma. Nonspecific edema within the posterior neck
soft tissues.

Disc levels: Cervical spondylosis with multilevel disc space
narrowing, posterior disc osteophytes, uncovertebral and facet
hypertrophy. There is advanced disc space narrowing at the C3-C4
through C6-C7 levels. There is ossification of the posterior
longitudinal ligament greatest at the C5-C6 level. Suspected at
least moderate spinal canal stenosis at C3-C4, C4-C5, C5-C6 and
C6-C7. Multilevel bony neural foraminal narrowing. Multilevel
ventral osteophytes.

Upper chest: No consolidation within the imaged lung apices. No
visible pneumothorax
IMPRESSION: CT head:

1. No evidence of acute intracranial abnormality.
2. Moderate to advanced chronic small vessel ischemic disease.
3. Mild generalized parenchymal atrophy.
4. Mild ethmoid sinus mucosal thickening.
5. Fairly diffuse scalp edema, most notable posteriorly.

CT cervical spine:

1. No evidence of acute fracture to the cervical spine.
2. Advanced cervical spondylosis with ossification of the posterior
longitudinal ligament. Suspected at least moderate spinal canal
stenosis at C3-C4, C4-C5, C5-C6 and C6-C7. Multilevel bony neural
foraminal narrowing.
3. Mild C3-C4 and C4-C5 grade 1 retrolisthesis.
4. Nonspecific edema within the posterior neck soft tissues.

## 2022-05-06 ENCOUNTER — Ambulatory Visit: Payer: Medicare Other | Admitting: Cardiovascular Disease

## 2022-05-06 NOTE — Progress Notes (Deleted)
No chief complaint on file.  History of Present Illness: 76 yo male with history of diabetes mellitus, hypertension, hyperlipidemia, prostate cancer, seizures, arthritis, anxiety/depression, gout, ischemic cardiomyopathy and coronary artery disease who is here today for cardiac follow up. Cardiac cath in 2006 with moderate non-obstructive CAD. Stress myoview 09/28/14 was an intermediate risk study with fixed defect and normal wall motion in that area, LVEF=44%. No clear ischemia. Cardiac cath May 2016 with severe disease in the distal LAD but vessel too small for PCI. LVEF=45-50%. He had worsened chest pain in December 2020 and cardiac cath was repeated. This showed stable CAD with severe disease in the distal LAD not felt to be favorable for PCI. He was started on Imdur.   He is here today for follow up. The patient denies any chest pain, dyspnea, palpitations, lower extremity edema, orthopnea, PND, dizziness, near syncope or syncope.   Primary Care Physician: [name], MD  Past Medical History:  Diagnosis Date   Anxiety    Arthritis    CAD in native artery    a. cath 2006 - mod CAD, med rx. b. cath 2016 -  20% prox-mid RCA, 30% ost-prox LAD, 30% mLAd, 99% distal LAD-2, 90% distal LAD-1, 50% D2, 70% D3 -> the LAD disease was small caliber/apical and too small for PCI. EF was 45-50%   Depression    Diabetes mellitus type 2 in nonobese ([facility])    GERD (gastroesophageal reflux disease)    Gout    Heart murmur    HTN (hypertension)    Hx of radiation therapy 01/08/14- 03/01/14   prostate 7600 cGy 38 sessions   Hyperlipidemia    Inguinal hernia    recurrent right inguinal hernia   Ischemic cardiomyopathy    a. EF 45-50% by cath 2016.   Meatal stenosis 1966, 1967   Prostate cancer ([place]) 08/10/13   Gleason 6, volume 28.67 cc   Seizures ([facility])     Past Surgical History:  Procedure Laterality Date   CARDIAC CATHETERIZATION N/A 01/14/2015   Procedure: Left Heart Cath and Coronary  Angiography;  Surgeon: Burnell Blanks, MD;  Location: [facility];  Service: Cardiovascular;  Laterality: N/A;   HAND SURGERY     INGUINAL HERNIA REPAIR  1966, 1967   bilateral herniorrphoes   LEFT HEART CATH AND CORONARY ANGIOGRAPHY N/A 08/14/2019   Procedure: LEFT HEART CATH AND CORONARY ANGIOGRAPHY;  Surgeon: Burnell Blanks, MD;  Location: [facility];  Service: Cardiovascular;  Laterality: N/A;   PROSTATE BIOPSY  08/10/13   gleason 3+3=6, vol 28.67 cc    Current Outpatient Medications  Medication Sig Dispense Refill   amLODipine (NORVASC) 5 MG tablet Take 5 mg by mouth daily.     aspirin EC 81 MG tablet Take 81 mg by mouth daily.     carvedilol (COREG) 6.25 MG tablet Take 6.25 mg by mouth 2 (two) times daily with a meal.      Coenzyme Q10 200 MG capsule Take 200 mg by mouth daily.     COVID-19 mRNA bivalent vaccine, Pfizer, (PFIZER COVID-19 VAC BIVALENT) injection Inject into the muscle. 0.3 mL 0   COVID-19 mRNA Vac-TriS, Pfizer, (PFIZER-BIONT COVID-19 VAC-TRIS) SUSP injection Inject into the muscle. 0.3 mL 0   fish oil-omega-3 fatty acids 1000 MG capsule Take 1 g by mouth daily.      glimepiride (AMARYL) 4 MG tablet Take 4 mg by mouth 2 (two) times daily.      isosorbide  mononitrate (IMDUR) 60 MG 24 hr tablet TAKE 1/2 TABLET(30 MG) BY MOUTH DAILY 90 tablet 3   JANUMET 50-500 MG per tablet Take 1 tablet by mouth daily.  0   Multiple Vitamins-Minerals (MULTIVITAMIN WITH MINERALS) tablet Take 1 tablet by mouth daily.     naproxen sodium (ANAPROX) 220 MG tablet Take 440 mg by mouth 2 (two) times daily as needed (back pain).     nitroGLYCERIN (NITROSTAT) 0.4 MG SL tablet Place 1 tablet (0.4 mg total) under the tongue every 5 (five) minutes as needed for chest pain. 25 tablet 6   rosuvastatin (CRESTOR) 20 MG tablet Take 1 tablet (20 mg total) by mouth daily. 90 tablet 3   vitamin B-12 (CYANOCOBALAMIN) 1000 MCG tablet Take 1,000 mcg by mouth daily.     No  current facility-administered medications for this visit.    Allergies  Allergen Reactions   Vicodin [Hydrocodone-Acetaminophen] Other (See Comments)    dizzy and sick feeling/Knocks him out    Social History   Socioeconomic History   Marital status: Married    Spouse name: Not on file   Number of children: Not on file   Years of education: Not on file   Highest education level: Not on file  Occupational History   Not on file  Tobacco Use   Smoking status: Never   Smokeless tobacco: Never  Vaping Use   Vaping Use: Never used  Substance and Sexual Activity   Alcohol use: Yes    Comment: ocasionally   Drug use: No   Sexual activity: Not on file  Other Topics Concern   Not on file  Social History Narrative   Not on file   Social Determinants of Health   Financial Resource Strain: Not on file  Food Insecurity: Not on file  Transportation Needs: Not on file  Physical Activity: Not on file  Stress: Not on file  Social Connections: Not on file  Intimate Partner Violence: Not on file    Family History  Problem Relation Age of Onset   Diabetes Mother    Stroke Mother    Diabetes Sister    Heart attack Sister    Diabetes Brother    Cancer Father        larynx   Stroke Brother     Review of Systems:  As stated in the HPI and otherwise negative.   There were no vitals taken for this visit.  Physical Examination: General: Well developed, well nourished, NAD  HEENT: OP clear, mucus membranes moist  SKIN: warm, dry. No rashes. Neuro: No focal deficits  Musculoskeletal: Muscle strength 5/5 all ext  Psychiatric: Mood and affect normal  Neck: No JVD, no carotid bruits, no thyromegaly, no lymphadenopathy.  Lungs:Clear bilaterally, no wheezes, rhonci, crackles Cardiovascular: Regular rate and rhythm. No murmurs, gallops or rubs. Abdomen:Soft. Bowel sounds present. Non-tender.  Extremities: No lower extremity edema. Pulses are 2 + in the bilateral DP/PT.  EKG:   EKG is *** ordered today. The ekg ordered today demonstrates   Recent Labs: 02/17/2022: ALT 13 03/04/2022: BUN 14; Creatinine, Ser 0.89; Potassium 4.6; Sodium 143   Lipid Panel    Component Value Date/Time   CHOL 169 03/04/2022 1307   TRIG 80 03/04/2022 1307   HDL 69 03/04/2022 1307   CHOLHDL 2.4 03/04/2022 1307   CHOLHDL 3 03/18/2009 0902   VLDL 10.4 03/18/2009 0902   LDLCALC 85 03/04/2022 1307   LDLDIRECT 148.4 12/28/2007 1545     Wt Readings from  Last 3 Encounters:  12/18/21 171 lb (77.6 kg)  06/06/20 174 lb (78.9 kg)  09/05/19 176 lb (79.8 kg)     Other studies Reviewed: Additional studies/ records that were reviewed today include: . Review of the above records demonstrates:    Assessment and Plan:   1. CAD with stable angina: He has no chest pain suggestive of angina. Will continue ASA, statin, Imdur and beta blocker.    2. HTN: BP is controlled. No changes  3. HLD: LDL not at goal in July 2023. Crestor increased to 20 mg daily. *** Will need repeat lipids and LFTS now. Continue statin.     Current medicines are reviewed at length with the patient today.  The patient does not have concerns regarding medicines.  The following changes have been made:  no change  Labs/ tests ordered today include:   No orders of the defined types were placed in this encounter.   Disposition:   FU   Signed, Lauree Chandler, MD 05/06/2022 12:14 PM    [facility] [address], [place], [place]  [address] Phone: [phone]; Fax: [phone]

## 2022-05-17 NOTE — Progress Notes (Unsigned)
No chief complaint on file.  History of Present Illness: 76 yo male with history of diabetes mellitus, hypertension, hyperlipidemia, prostate cancer, seizures, arthritis, anxiety/depression, gout, ischemic cardiomyopathy and coronary artery disease who is here today for cardiac follow up. Cardiac cath May 2016 with severe disease in the distal LAD but vessel too small for PCI. LVEF=45-50%. He had worsened chest pain in December 2020 and cardiac cath was repeated. This showed stable CAD with severe disease in the distal LAD not felt to be favorable for PCI. He was started on Imdur.   He is here today for follow up. The patient denies any chest pain, dyspnea, palpitations, lower extremity edema, orthopnea, PND, dizziness, near syncope or syncope.   Primary Care Physician: [name], MD  Past Medical History:  Diagnosis Date   Anxiety    Arthritis    CAD in native artery    a. cath 2006 - mod CAD, med rx. b. cath 2016 -  20% prox-mid RCA, 30% ost-prox LAD, 30% mLAd, 99% distal LAD-2, 90% distal LAD-1, 50% D2, 70% D3 -> the LAD disease was small caliber/apical and too small for PCI. EF was 45-50%   Depression    Diabetes mellitus type 2 in nonobese (HCC)    GERD (gastroesophageal reflux disease)    Gout    Heart murmur    HTN (hypertension)    Hx of radiation therapy 01/08/14- 03/01/14   prostate 7600 cGy 38 sessions   Hyperlipidemia    Inguinal hernia    recurrent right inguinal hernia   Ischemic cardiomyopathy    a. EF 45-50% by cath 2016.   Meatal stenosis 1966, 1967   Prostate cancer ([place]) 08/10/13   Gleason 6, volume 28.67 cc   Seizures (HCC)     Past Surgical History:  Procedure Laterality Date   CARDIAC CATHETERIZATION N/A 01/14/2015   Procedure: Left Heart Cath and Coronary Angiography;  Surgeon: Burnell Blanks, MD;  Location: [facility];  Service: Cardiovascular;  Laterality: N/A;   HAND SURGERY     INGUINAL HERNIA REPAIR  1966, 1967   bilateral  herniorrphoes   LEFT HEART CATH AND CORONARY ANGIOGRAPHY N/A 08/14/2019   Procedure: LEFT HEART CATH AND CORONARY ANGIOGRAPHY;  Surgeon: Burnell Blanks, MD;  Location: [facility];  Service: Cardiovascular;  Laterality: N/A;   PROSTATE BIOPSY  08/10/13   gleason 3+3=6, vol 28.67 cc    Current Outpatient Medications  Medication Sig Dispense Refill   amLODipine (NORVASC) 5 MG tablet Take 5 mg by mouth daily.     aspirin EC 81 MG tablet Take 81 mg by mouth daily.     carvedilol (COREG) 6.25 MG tablet Take 6.25 mg by mouth 2 (two) times daily with a meal.      Coenzyme Q10 200 MG capsule Take 200 mg by mouth daily.     COVID-19 mRNA bivalent vaccine, Pfizer, (PFIZER COVID-19 VAC BIVALENT) injection Inject into the muscle. 0.3 mL 0   COVID-19 mRNA Vac-TriS, Pfizer, (PFIZER-BIONT COVID-19 VAC-TRIS) SUSP injection Inject into the muscle. 0.3 mL 0   fish oil-omega-3 fatty acids 1000 MG capsule Take 1 g by mouth daily.      glimepiride (AMARYL) 4 MG tablet Take 4 mg by mouth 2 (two) times daily.      isosorbide mononitrate (IMDUR) 60 MG 24 hr tablet TAKE 1/2 TABLET(30 MG) BY MOUTH DAILY 90 tablet 3   JANUMET 50-500 MG per tablet Take 1 tablet by mouth  daily.  0   Multiple Vitamins-Minerals (MULTIVITAMIN WITH MINERALS) tablet Take 1 tablet by mouth daily.     naproxen sodium (ANAPROX) 220 MG tablet Take 440 mg by mouth 2 (two) times daily as needed (back pain).     nitroGLYCERIN (NITROSTAT) 0.4 MG SL tablet Place 1 tablet (0.4 mg total) under the tongue every 5 (five) minutes as needed for chest pain. 25 tablet 6   rosuvastatin (CRESTOR) 20 MG tablet Take 1 tablet (20 mg total) by mouth daily. 90 tablet 3   vitamin B-12 (CYANOCOBALAMIN) 1000 MCG tablet Take 1,000 mcg by mouth daily.     No current facility-administered medications for this visit.    Allergies  Allergen Reactions   Vicodin [Hydrocodone-Acetaminophen] Other (See Comments)    dizzy and sick feeling/Knocks him out     Social History   Socioeconomic History   Marital status: Married    Spouse name: Not on file   Number of children: Not on file   Years of education: Not on file   Highest education level: Not on file  Occupational History   Not on file  Tobacco Use   Smoking status: Never   Smokeless tobacco: Never  Vaping Use   Vaping Use: Never used  Substance and Sexual Activity   Alcohol use: Yes    Comment: ocasionally   Drug use: No   Sexual activity: Not on file  Other Topics Concern   Not on file  Social History Narrative   Not on file   Social Determinants of Health   Financial Resource Strain: Not on file  Food Insecurity: Not on file  Transportation Needs: Not on file  Physical Activity: Not on file  Stress: Not on file  Social Connections: Not on file  Intimate Partner Violence: Not on file    Family History  Problem Relation Age of Onset   Diabetes Mother    Stroke Mother    Diabetes Sister    Heart attack Sister    Diabetes Brother    Cancer Father        larynx   Stroke Brother     Review of Systems:  As stated in the HPI and otherwise negative.   There were no vitals taken for this visit.  Physical Examination: General: Well developed, well nourished, NAD  HEENT: OP clear, mucus membranes moist  SKIN: warm, dry. No rashes. Neuro: No focal deficits  Musculoskeletal: Muscle strength 5/5 all ext  Psychiatric: Mood and affect normal  Neck: No JVD, no carotid bruits, no thyromegaly, no lymphadenopathy.  Lungs:Clear bilaterally, no wheezes, rhonci, crackles Cardiovascular: Regular rate and rhythm. No murmurs, gallops or rubs. Abdomen:Soft. Bowel sounds present. Non-tender.  Extremities: No lower extremity edema. Pulses are 2 + in the bilateral DP/PT.  EKG:  EKG is not *** ordered today. The ekg ordered today demonstrates   Recent Labs: 02/17/2022: ALT 13 03/04/2022: BUN 14; Creatinine, Ser 0.89; Potassium 4.6; Sodium 143   Lipid Panel    Component  Value Date/Time   CHOL 169 03/04/2022 1307   TRIG 80 03/04/2022 1307   HDL 69 03/04/2022 1307   CHOLHDL 2.4 03/04/2022 1307   CHOLHDL 3 03/18/2009 0902   VLDL 10.4 03/18/2009 0902   LDLCALC 85 03/04/2022 1307   LDLDIRECT 148.4 12/28/2007 1545     Wt Readings from Last 3 Encounters:  12/18/21 171 lb (77.6 kg)  06/06/20 174 lb (78.9 kg)  09/05/19 176 lb (79.8 kg)     Assessment and Plan:  1. CAD with stable angina: No change in chronic chest pains. He is known to have severe distal LAD stenosis that is not amenable to PCI. Will continue ASA, Imdur, beta blocker and statin.    2. HTN: BP is controlled. No changes  3. HLD: Lipids are followed in primary care. LDL 85 in July 2023. Crestor increased then. *** Repeat lipids and LFTs today. Continue statin  Current medicines are reviewed at length with the patient today.  The patient does not have concerns regarding medicines.  The following changes have been made:  no change  Labs/ tests ordered today include:   No orders of the defined types were placed in this encounter.   Disposition:   F/U with me in ***  Signed, Lauree Chandler, MD 05/17/2022 9:43 AM    [facility] [address], [place], [place]  [address] Phone: [phone]; Fax: [phone]

## 2022-05-18 ENCOUNTER — Ambulatory Visit: Payer: Medicare Other | Attending: Cardiovascular Disease | Admitting: Cardiovascular Disease

## 2022-05-18 ENCOUNTER — Encounter: Payer: Self-pay | Admitting: Cardiovascular Disease

## 2022-05-18 VITALS — BP 130/72 | HR 73 | Ht 71.0 in | Wt 171.6 lb

## 2022-05-18 DIAGNOSIS — I25118 Atherosclerotic heart disease of native coronary artery with other forms of angina pectoris: Secondary | ICD-10-CM | POA: Diagnosis not present

## 2022-05-18 DIAGNOSIS — E785 Hyperlipidemia, unspecified: Secondary | ICD-10-CM

## 2022-05-18 DIAGNOSIS — I10 Essential (primary) hypertension: Secondary | ICD-10-CM | POA: Diagnosis not present

## 2022-05-18 LAB — LIPID PANEL
Chol/HDL Ratio: 2.4 ratio (ref 0.0–5.0)
Cholesterol, Total: 141 mg/dL (ref 100–199)
HDL: 60 mg/dL (ref >39)
LDL Chol Calc (NIH): 70 mg/dL (ref 0–99)
Triglycerides: 50 mg/dL (ref 0–149)
VLDL Cholesterol Cal: 11 mg/dL (ref 5–40)

## 2022-05-18 LAB — HEPATIC FUNCTION PANEL
ALT: 11 IU/L (ref 0–44)
AST: 14 IU/L (ref 0–40)
Albumin: 4.2 g/dL (ref 3.8–4.8)
Alkaline Phosphatase: 73 IU/L (ref 44–121)
Bilirubin Total: 0.4 mg/dL (ref 0.0–1.2)
Bilirubin, Direct: 0.13 mg/dL (ref 0.00–0.40)
Total Protein: 6.2 g/dL (ref 6.0–8.5)

## 2022-05-18 NOTE — Patient Instructions (Signed)
Medication Instructions:  No changes *If you need a refill on your cardiac medications before your next appointment, please call your pharmacy*   Lab Work: Today: lipids/liver function  If you have labs (blood work) drawn today and your tests are completely normal, you will receive your results only by: [place] (if you have MyChart) OR A paper copy in the mail If you have any lab test that is abnormal or we need to change your treatment, we will call you to review the results.   Testing/Procedures: none   Follow-Up: At [facility], you and your health needs are our priority.  As part of our continuing mission to provide you with exceptional heart care, we have created designated Provider Care Teams.  These Care Teams include your primary Cardiologist (physician) and Advanced Practice Providers (APPs -  Physician Assistants and Nurse Practitioners) who all work together to provide you with the care you need, when you need it.  We recommend signing up for the patient portal called "MyChart".  Sign up information is provided on this After Visit Summary.  MyChart is used to connect with patients for Virtual Visits (Telemedicine).  Patients are able to view lab/test results, encounter notes, upcoming appointments, etc.  Non-urgent messages can be sent to your provider as well.   To learn more about what you can do with MyChart, go to [email or web address].    Your next appointment:   12 month(s)  The format for your next appointment:   In Person  Provider:   Lauree Chandler, MD     Other Instructions   Important Information About Sugar

## 2022-07-17 ENCOUNTER — Other Ambulatory Visit (HOSPITAL_BASED_OUTPATIENT_CLINIC_OR_DEPARTMENT_OTHER): Payer: Self-pay

## 2022-07-17 MED ORDER — INFLUENZA VAC A&B SA ADJ QUAD 0.5 ML IM PRSY
PREFILLED_SYRINGE | INTRAMUSCULAR | 0 refills | Status: DC
  Filled 2022-07-17: qty 0.5, 1d supply, fill #0

## 2022-07-17 MED ORDER — COMIRNATY 30 MCG/0.3ML IM SUSY
PREFILLED_SYRINGE | INTRAMUSCULAR | 0 refills | Status: DC
  Filled 2022-07-17: qty 0.3, 1d supply, fill #0

## 2022-08-05 ENCOUNTER — Ambulatory Visit: Payer: Medicare Other | Admitting: Cardiovascular Disease

## 2023-02-11 ENCOUNTER — Other Ambulatory Visit (HOSPITAL_BASED_OUTPATIENT_CLINIC_OR_DEPARTMENT_OTHER): Payer: Self-pay | Admitting: Family

## 2023-03-30 ENCOUNTER — Other Ambulatory Visit: Payer: Self-pay | Admitting: Endocrinology

## 2023-03-30 ENCOUNTER — Ambulatory Visit
Admission: RE | Admit: 2023-03-30 | Discharge: 2023-03-30 | Disposition: A | Discharge status: Home / Self Care | Payer: Medicare Other | Source: Ambulatory Visit | Attending: Endocrinology | Admitting: Endocrinology

## 2023-03-30 DIAGNOSIS — R748 Abnormal levels of other serum enzymes: Secondary | ICD-10-CM

## 2023-03-30 MED ORDER — IOPAMIDOL (ISOVUE-300) INJECTION 61%
100.0000 mL | Freq: Once | INTRAVENOUS | Status: AC | PRN
  Administered 2023-03-30: 100 mL via INTRAVENOUS

## 2023-03-30 MED ORDER — IOPAMIDOL (ISOVUE-300) INJECTION 61%: 100.0000 mL | Freq: Once | INTRAVENOUS | Status: DC | PRN

## 2023-03-31 ENCOUNTER — Other Ambulatory Visit: Payer: Medicare Other

## 2023-05-24 ENCOUNTER — Other Ambulatory Visit: Payer: Self-pay | Admitting: *Deleted

## 2023-05-24 MED ORDER — ROSUVASTATIN CALCIUM 20 MG PO TABS: 20.0000 mg | ORAL_TABLET | Freq: Every day | ORAL | 0 refills | Status: DC

## 2023-07-15 ENCOUNTER — Other Ambulatory Visit: Payer: Self-pay | Admitting: Cardiovascular Disease

## 2023-09-13 ENCOUNTER — Telehealth: Payer: Self-pay | Admitting: Cardiovascular Disease

## 2023-09-13 NOTE — Telephone Encounter (Signed)
 Pt came in today to ask about his meds he needs a refill and his pharmacy told his to contact his Doctor. Please give him a call.

## 2023-09-16 MED ORDER — ROSUVASTATIN CALCIUM 20 MG PO TABS: 20.0000 mg | ORAL_TABLET | Freq: Every day | ORAL | 0 refills | Status: DC

## 2023-09-16 NOTE — Telephone Encounter (Signed)
Called patient and scheduled his follow up appointment and refilled his rosuvastatin.

## 2023-10-19 ENCOUNTER — Encounter: Payer: Self-pay | Admitting: Cardiovascular Disease

## 2023-10-19 ENCOUNTER — Ambulatory Visit: Payer: Medicare Other | Attending: Cardiovascular Disease | Admitting: Cardiovascular Disease

## 2023-10-19 VITALS — BP 150/78 | HR 70 | Ht 71.0 in | Wt 170.2 lb

## 2023-10-19 DIAGNOSIS — I10 Essential (primary) hypertension: Secondary | ICD-10-CM

## 2023-10-19 DIAGNOSIS — E785 Hyperlipidemia, unspecified: Secondary | ICD-10-CM | POA: Diagnosis not present

## 2023-10-19 DIAGNOSIS — I25118 Atherosclerotic heart disease of native coronary artery with other forms of angina pectoris: Secondary | ICD-10-CM | POA: Diagnosis not present

## 2023-10-19 NOTE — Patient Instructions (Signed)
Medication Instructions:  No changes *If you need a refill on your cardiac medications before your next appointment, please call your pharmacy*   Lab Work: none If you have labs (blood work) drawn today and your tests are completely normal, you will receive your results only by: [place] (if you have MyChart) OR A paper copy in the mail If you have any lab test that is abnormal or we need to change your treatment, we will call you to review the results.   Testing/Procedures: none   Follow-Up: At [facility], you and your health needs are our priority.  As part of our continuing mission to provide you with exceptional heart care, we have created designated Provider Care Teams.  These Care Teams include your primary Cardiologist (physician) and Advanced Practice Providers (APPs -  Physician Assistants and Nurse Practitioners) who all work together to provide you with the care you need, when you need it.  We recommend signing up for the patient portal called "MyChart".  Sign up information is provided on this After Visit Summary.  MyChart is used to connect with patients for Virtual Visits (Telemedicine).  Patients are able to view lab/test results, encounter notes, upcoming appointments, etc.  Non-urgent messages can be sent to your provider as well.   To learn more about what you can do with MyChart, go to [email or web address].    Your next appointment:   12 month(s)  Provider:   Lauree Chandler, MD     Other Instructions

## 2023-10-19 NOTE — Progress Notes (Signed)
Chief Complaint  Patient presents with   Follow-up    CAD   History of Present Illness: 78 yo male with history of diabetes mellitus, hypertension, hyperlipidemia, prostate cancer, seizures, arthritis, anxiety/depression, gout, ischemic cardiomyopathy and coronary artery disease who is here today for cardiac follow up. Cardiac cath May 2016 with severe disease in the distal LAD but vessel too small for PCI. LVEF=45-50%. He had worsened chest pain in December 2020 and cardiac cath was repeated. This showed stable CAD with severe disease in the distal LAD not felt to be favorable for PCI. He was started on Imdur.   He is here today for follow up. The patient denies any dyspnea, palpitations, lower extremity edema, orthopnea, PND, dizziness, near syncope or syncope. Rare chest burning.   Primary Care Physician: Adrian Prince, MD  Past Medical History:  Diagnosis Date   Anxiety    Arthritis    CAD in native artery    a. cath 2006 - mod CAD, med rx. b. cath 2016 -  20% prox-mid RCA, 30% ost-prox LAD, 30% mLAd, 99% distal LAD-2, 90% distal LAD-1, 50% D2, 70% D3 -> the LAD disease was small caliber/apical and too small for PCI. EF was 45-50%   Depression    Diabetes mellitus type 2 in nonobese (HCC)    GERD (gastroesophageal reflux disease)    Gout    Heart murmur    HTN (hypertension)    Hx of radiation therapy 01/08/14- 03/01/14   prostate 7600 cGy 38 sessions   Hyperlipidemia    Inguinal hernia    recurrent right inguinal hernia   Ischemic cardiomyopathy    a. EF 45-50% by cath 2016.   Meatal stenosis 1966, 1967   Prostate cancer (HCC) 08/10/13   Gleason 6, volume 28.67 cc   Seizures (HCC)     Past Surgical History:  Procedure Laterality Date   CARDIAC CATHETERIZATION N/A 01/14/2015   Procedure: Left Heart Cath and Coronary Angiography;  Surgeon: Kathleene Hazel, MD;  Location: [facility];  Service: Cardiovascular;  Laterality: N/A;   HAND SURGERY     INGUINAL  HERNIA REPAIR  1966, 1967   bilateral herniorrphoes   LEFT HEART CATH AND CORONARY ANGIOGRAPHY N/A 08/14/2019   Procedure: LEFT HEART CATH AND CORONARY ANGIOGRAPHY;  Surgeon: Kathleene Hazel, MD;  Location: [facility];  Service: Cardiovascular;  Laterality: N/A;   PROSTATE BIOPSY  08/10/13   gleason 3+3=6, vol 28.67 cc    Current Outpatient Medications  Medication Sig Dispense Refill   amLODipine (NORVASC) 5 MG tablet Take 5 mg by mouth daily.     aspirin EC 81 MG tablet Take 81 mg by mouth daily.     carvedilol (COREG) 6.25 MG tablet Take 6.25 mg by mouth 2 (two) times daily with a meal.      Coenzyme Q10 200 MG capsule Take 200 mg by mouth daily.     COVID-19 mRNA bivalent vaccine, Pfizer, (PFIZER COVID-19 VAC BIVALENT) injection Inject into the muscle. 0.3 mL 0   COVID-19 mRNA Vac-TriS, Pfizer, (PFIZER-BIONT COVID-19 VAC-TRIS) SUSP injection Inject into the muscle. 0.3 mL 0   COVID-19 mRNA vaccine 2023-2024 (COMIRNATY) syringe Inject into the muscle. 0.3 mL 0   fish oil-omega-3 fatty acids 1000 MG capsule Take 1 g by mouth daily.      glimepiride (AMARYL) 4 MG tablet Take 4 mg by mouth 2 (two) times daily.      glucose blood (ONETOUCH VERIO) test  strip check blood sugars 2 times  a day Dx E11.51 In Vitro     influenza vaccine adjuvanted (FLUAD) 0.5 ML injection Inject into the muscle. 0.5 mL 0   isosorbide mononitrate (IMDUR) 60 MG 24 hr tablet TAKE 1/2 TABLET BY MOUTH DAILY 90 tablet 1   JANUMET 50-500 MG per tablet Take 1 tablet by mouth 2 (two) times daily with a meal.  0   Multiple Vitamins-Minerals (MULTIVITAMIN WITH MINERALS) tablet Take 1 tablet by mouth daily.     naproxen sodium (ANAPROX) 220 MG tablet Take 440 mg by mouth 2 (two) times daily as needed (back pain).     nitroGLYCERIN (NITROSTAT) 0.4 MG SL tablet Place 1 tablet (0.4 mg total) under the tongue every 5 (five) minutes as needed for chest pain. 25 tablet 6   rosuvastatin (CRESTOR) 20 MG tablet Take 1  tablet (20 mg total) by mouth daily. 90 tablet 0   vitamin B-12 (CYANOCOBALAMIN) 1000 MCG tablet Take 1,000 mcg by mouth daily.     No current facility-administered medications for this visit.    Allergies  Allergen Reactions   Vicodin [Hydrocodone-Acetaminophen] Other (See Comments)    dizzy and sick feeling/Knocks him out    Social History   Socioeconomic History   Marital status: Married    Spouse name: Not on file   Number of children: Not on file   Years of education: Not on file   Highest education level: Not on file  Occupational History   Not on file  Tobacco Use   Smoking status: Never   Smokeless tobacco: Never  Vaping Use   Vaping status: Never Used  Substance and Sexual Activity   Alcohol use: Yes    Comment: ocasionally   Drug use: No   Sexual activity: Not on file  Other Topics Concern   Not on file  Social History Narrative   Not on file   Social Drivers of Health   Financial Resource Strain: Not on file  Food Insecurity: Not on file  Transportation Needs: Not on file  Physical Activity: Not on file  Stress: Not on file  Social Connections: Not on file  Intimate Partner Violence: Not on file    Family History  Problem Relation Age of Onset   Diabetes Mother    Stroke Mother    Diabetes Sister    Heart attack Sister    Diabetes Brother    Cancer Father        larynx   Stroke Brother     Review of Systems:  As stated in the HPI and otherwise negative.   BP (!) 150/78   Pulse 70   Ht 5\' 11"  (1.803 m)   Wt 77.2 kg   SpO2 98%   BMI 23.74 kg/m   Physical Examination: General: Well developed, well nourished, NAD  HEENT: OP clear, mucus membranes moist  SKIN: warm, dry. No rashes. Neuro: No focal deficits  Musculoskeletal: Muscle strength 5/5 all ext  Psychiatric: Mood and affect normal  Neck: No JVD, no carotid bruits, no thyromegaly, no lymphadenopathy.  Lungs:Clear bilaterally, no wheezes, rhonci, crackles Cardiovascular:  Regular rate and rhythm. No murmurs, gallops or rubs. Abdomen:Soft. Bowel sounds present. Non-tender.  Extremities: No lower extremity edema. Pulses are 2 + in the bilateral DP/PT.  EKG:  EKG is ordered today. The ekg ordered today demonstrates  EKG Interpretation Date/Time:  Tuesday October 19 2023 14:34:02 EST Ventricular Rate:  69 PR Interval:  278 QRS Duration:  88  QT Interval:  394 QTC Calculation: 422 R Axis:   63  Text Interpretation: Sinus rhythm with sinus arrhythmia with 1st degree A-V block Confirmed by Verne Carrow [phone]) on 10/19/2023 2:43:18 PM    Recent Labs: No results found for requested labs within last 365 days.   Lipid Panel    Component Value Date/Time   CHOL 141 05/18/2022 0834   TRIG 50 05/18/2022 0834   HDL 60 05/18/2022 0834   CHOLHDL 2.4 05/18/2022 0834   CHOLHDL 3 03/18/2009 0902   VLDL 10.4 03/18/2009 0902   LDLCALC 70 05/18/2022 0834   LDLDIRECT 148.4 12/28/2007 1545     Wt Readings from Last 3 Encounters:  10/19/23 77.2 kg  05/18/22 77.8 kg  12/18/21 77.6 kg   Assessment and Plan:   1. CAD with stable angina: Stable chronic chest pains. He is known to have severe distal LAD stenosis that is not amenable to PCI (vessel small and diffusely diseased). Diffuse disease in the small caliber diagonal branch. Continue ASA, Imdur, statin and beta blocker.     2. HTN: BP is elevated today. He will follow at home for the next week. Continue Norvasc, Coreg and Imdur  3. HLD: Lipids are followed in primary care. LDL near goal in July 2024. Continue Crestor  Labs/ tests ordered today include:   Orders Placed This Encounter  Procedures   EKG 12-Lead   Disposition:   F/U with me in 12 months  Signed, Verne Carrow, MD 10/19/2023 2:47 PM    [facility] [address] [place], [place], [place]  [address] Phone: [phone]; Fax: [phone]

## 2024-01-10 ENCOUNTER — Telehealth: Payer: Self-pay

## 2024-01-10 NOTE — Telephone Encounter (Signed)
 S/W pt and scheduled TELE Preop appt 01/12/24. med rec and consent done

## 2024-01-10 NOTE — Telephone Encounter (Signed)
 med rec and consent done     Patient Consent for Virtual Visit        Seth Guzman has provided verbal consent on 01/10/2024 for a virtual visit (video or telephone).   CONSENT FOR VIRTUAL VISIT FOR:  Seth Guzman  By participating in this virtual visit I agree to the following:  I hereby voluntarily request, consent and authorize [facility] and its employed or contracted physicians, physician assistants, nurse practitioners or other licensed health care professionals (the Practitioner), to provide me with telemedicine health care services (the "Services") as deemed necessary by the treating Practitioner. I acknowledge and consent to receive the Services by the Practitioner via telemedicine. I understand that the telemedicine visit will involve communicating with the Practitioner through live audiovisual communication technology and the disclosure of certain medical information by electronic transmission. I acknowledge that I have been given the opportunity to request an in-person assessment or other available alternative prior to the telemedicine visit and am voluntarily participating in the telemedicine visit.  I understand that I have the right to withhold or withdraw my consent to the use of telemedicine in the course of my care at any time, without affecting my right to future care or treatment, and that the Practitioner or I may terminate the telemedicine visit at any time. I understand that I have the right to inspect all information obtained and/or recorded in the course of the telemedicine visit and may receive copies of available information for a reasonable fee.  I understand that some of the potential risks of receiving the Services via telemedicine include:  Delay or interruption in medical evaluation due to technological equipment failure or disruption; Information transmitted may not be sufficient (e.g. poor resolution of images) to allow for appropriate medical decision  making by the Practitioner; and/or  In rare instances, security protocols could fail, causing a breach of personal health information.  Furthermore, I acknowledge that it is my responsibility to provide information about my medical history, conditions and care that is complete and accurate to the best of my ability. I acknowledge that Practitioner's advice, recommendations, and/or decision may be based on factors not within their control, such as incomplete or inaccurate data provided by me or distortions of diagnostic images or specimens that may result from electronic transmissions. I understand that the practice of medicine is not an exact science and that Practitioner makes no warranties or guarantees regarding treatment outcomes. I acknowledge that a copy of this consent can be made available to me via my patient portal [facility] MyChart), or I can request a printed copy by calling the office of [facility].    I understand that my insurance will be billed for this visit.   I have read or had this consent read to me. I understand the contents of this consent, which adequately explains the benefits and risks of the Services being provided via telemedicine.  I have been provided ample opportunity to ask questions regarding this consent and the Services and have had my questions answered to my satisfaction. I give my informed consent for the services to be provided through the use of telemedicine in my medical care

## 2024-01-10 NOTE — Telephone Encounter (Signed)
   Pre-operative Risk Assessment    Patient Name: Seth Guzman  DOB: Jan 14, 1946 MRN: [id number]   Date of last office visit: 10/19/23 Antoinette Batman, MD Date of next office visit: NONE   Request for Surgical Clearance    Procedure:  RIGHT CARPAL TUNNEL RELEASE RIGHT ULNAR NERVE DECOMPRESSION IN SITU AT THE ELBOW  Date of Surgery:  Clearance 01/25/24                                Surgeon:  DR Ronn Cohn Surgeon's Group or Practice Name:  [facility] Phone number:  [phone] Fax number:  [phone]   ATTN: Amanda Jungling   Type of Clearance Requested:   - Medical  - Pharmacy:  Hold Aspirin      Type of Anesthesia:  General    Additional requests/questions:    SignedCollin Deal   01/10/2024, 10:06 AM

## 2024-01-10 NOTE — Telephone Encounter (Signed)
   Name: Seth Guzman  DOB: 1946/05/20  MRN: [id number]  Primary Cardiologist: Antoinette Batman, MD   Preoperative team, please contact this patient and set up a phone call appointment for further preoperative risk assessment. Please obtain consent and complete medication review. Thank you for your help.  I confirm that guidance regarding antiplatelet and oral anticoagulation therapy has been completed and, if necessary, noted below.  Ideally aspirin  should be continued without interruption, however if the bleeding risk is too great, aspirin  may be held for 5-7 days prior to surgery. Please resume aspirin  post operatively when it is felt to be safe from a bleeding standpoint.    I also confirmed the patient resides in the state of [place] . As per [facility] Medical Board telemedicine laws, the patient must reside in the state in which the provider is licensed.   Morey Ar, NP 01/10/2024, 10:19 AM [facility]

## 2024-01-12 ENCOUNTER — Ambulatory Visit

## 2024-01-12 NOTE — Telephone Encounter (Signed)
 Patient was scheduled for tele visit on 12/13/2023.  Patient was called x 4 without answer or call back.  Voicemail was full.  He will need to be rescheduled to a later date.

## 2024-01-12 NOTE — Telephone Encounter (Signed)
 Tried to call the pt to reschedule his tele preop appt that he missed today. I will update the requesting office if they s/w the pt to ask him to call [phone] and reschedule his tele visit for preop clearance. I was unable to leave a message today.

## 2024-01-12 NOTE — Progress Notes (Deleted)
 Virtual Visit via Telephone Note   Because of Seth Guzman co-morbid illnesses, he is at least at moderate risk for complications without adequate follow up.  This format is felt to be most appropriate for this patient at this time.  Due to technical limitations with video connection (technology), today's appointment will be conducted as an audio only telehealth visit, and Seth Guzman verbally agreed to proceed in this manner.   All issues noted in this document were discussed and addressed.  No physical exam could be performed with this format.  Evaluation Performed:  Preoperative cardiovascular risk assessment _____________   Date:  01/12/2024   Patient ID:  Seth Guzman, DOB 1947/01/80, MRN [id number] Patient Location:  Home Provider location:   Office  Primary Care Provider:  Rosslyn Coons, MD Primary Cardiologist:  Antoinette Batman, MD  Chief Complaint / Patient Profile   78 y.o. y/o male with a h/o diabetes, hypertension, hyperlipidemia, prostate cancer, seizures, arthritis, anxiety/depression, gout, ischemic cardiomyopathy, coronary artery disease who is pending right carpal tunnel release, right ulnar nerve nerve decompression in situ at the elbow with [facility] on 01/25/2024 by Dr. Aloha Arnold and presents today for telephonic preoperative cardiovascular risk assessment.  History of Present Illness    Seth Guzman is a 78 y.o. male who presents via audio/video conferencing for a telehealth visit today.  Pt was last seen in cardiology clinic on 10/19/2023 by Dr. Abel Hoe.  At that time Seth Guzman was doing well.  The patient is now pending procedure as outlined above. Since his last visit, he ***  Past Medical History    Past Medical History:  Diagnosis Date   Anxiety    Arthritis    CAD in native artery    a. cath 2006 - mod CAD, med rx. b. cath 2016 -  20% prox-mid RCA, 30% ost-prox LAD, 30% mLAd, 99% distal LAD-2, 90% distal LAD-1, 50% D2, 70% D3 -> the LAD  disease was small caliber/apical and too small for PCI. EF was 45-50%   Depression    Diabetes mellitus type 2 in nonobese (HCC)    GERD (gastroesophageal reflux disease)    Gout    Heart murmur    HTN (hypertension)    Hx of radiation therapy 01/08/14- 03/01/14   prostate 7600 cGy 38 sessions   Hyperlipidemia    Inguinal hernia    recurrent right inguinal hernia   Ischemic cardiomyopathy    a. EF 45-50% by cath 2016.   Meatal stenosis 1966, 1967   Prostate cancer (HCC) 08/10/13   Gleason 6, volume 28.67 cc   Seizures (HCC)    Past Surgical History:  Procedure Laterality Date   CARDIAC CATHETERIZATION N/A 01/14/2015   Procedure: Left Heart Cath and Coronary Angiography;  Surgeon: Odie Benne, MD;  Location: [facility] INVASIVE CV LAB;  Service: Cardiovascular;  Laterality: N/A;   HAND SURGERY     INGUINAL HERNIA REPAIR  1966, 1967   bilateral herniorrphoes   LEFT HEART CATH AND CORONARY ANGIOGRAPHY N/A 08/14/2019   Procedure: LEFT HEART CATH AND CORONARY ANGIOGRAPHY;  Surgeon: Odie Benne, MD;  Location: [facility] INVASIVE CV LAB;  Service: Cardiovascular;  Laterality: N/A;   PROSTATE BIOPSY  08/10/13   gleason 3+3=6, vol 28.67 cc    Allergies  Allergies  Allergen Reactions   Vicodin [Hydrocodone-Acetaminophen ] Other (See Comments)    dizzy and sick feeling/Knocks him out    Home Medications    Prior to Admission medications  Medication Sig Start Date End Date Taking? Authorizing Provider  amLODipine (NORVASC) 5 MG tablet Take 5 mg by mouth daily.    [provider]  aspirin  EC 81 MG tablet Take 81 mg by mouth daily.    [provider]  carvedilol  (COREG ) 6.25 MG tablet Take 6.25 mg by mouth 2 (two) times daily with a meal.     [provider]  Coenzyme Q10 200 MG capsule Take 200 mg by mouth daily.    [provider]  COVID-19 mRNA bivalent vaccine, Pfizer, (PFIZER COVID-19 VAC BIVALENT) injection Inject into the muscle. 09/08/21      COVID-19 mRNA Vac-TriS, Pfizer, (PFIZER-BIONT COVID-19 VAC-TRIS) SUSP injection Inject into the muscle. 02/10/21   Liane Redman, MD  COVID-19 mRNA vaccine [phone] (COMIRNATY ) syringe Inject into the muscle. 07/17/22   Liane Redman, MD  fish oil-omega-3 fatty acids 1000 MG capsule Take 1 g by mouth daily.     [provider]  glimepiride (AMARYL) 4 MG tablet Take 4 mg by mouth 2 (two) times daily.     [provider]  glucose blood (ONETOUCH VERIO) test strip check blood sugars 2 times  a day Dx E11.51 In Vitro 12/16/18   [provider]  influenza vaccine adjuvanted (FLUAD) 0.5 ML injection Inject into the muscle. 07/17/22   Liane Redman, MD  isosorbide  mononitrate (IMDUR ) 60 MG 24 hr tablet TAKE 1/2 TABLET BY MOUTH DAILY 02/11/23   Walker, Caitlin S, NP  JANUMET 50-500 MG per tablet Take 1 tablet by mouth 2 (two) times daily with a meal. 12/13/14   [provider]  Multiple Vitamins-Minerals (MULTIVITAMIN WITH MINERALS) tablet Take 1 tablet by mouth daily.    [provider]  naproxen sodium (ANAPROX) 220 MG tablet Take 440 mg by mouth 2 (two) times daily as needed (back pain).    [provider]  nitroGLYCERIN  (NITROSTAT ) 0.4 MG SL tablet Place 1 tablet (0.4 mg total) under the tongue every 5 (five) minutes as needed for chest pain. 10/03/14   Odie Benne, MD  rosuvastatin  (CRESTOR ) 20 MG tablet Take 1 tablet (20 mg total) by mouth daily. 09/16/23   Odie Benne, MD  vitamin B-12 (CYANOCOBALAMIN) 1000 MCG tablet Take 1,000 mcg by mouth daily.    [provider]    Physical Exam    Vital Signs:  Seth Guzman does not have vital signs available for review today.***  Given telephonic nature of communication, physical exam is limited. AAOx3. NAD. Normal affect.  Speech and respirations are unlabored.  Accessory Clinical Findings    None  Assessment & Plan    1.  Preoperative Cardiovascular Risk  Assessment:  The patient was advised that if he develops new symptoms prior to surgery to contact our office to arrange for a follow-up visit, and he verbalized understanding.  Ideally aspirin  should be continued without interruption, however if the bleeding risk is too great, aspirin  may be held for 5-7 days prior to surgery. Please resume aspirin  post operatively when it is felt to be safe from a bleeding standpoint.    A copy of this note will be routed to requesting surgeon.  Time:   Today, I have spent *** minutes with the patient with telehealth technology discussing medical history, symptoms, and management plan.     Ava Boatman, NP  01/12/2024, 12:30 PM

## 2024-01-14 NOTE — Telephone Encounter (Signed)
 2nd attempt to reach the pt who missed his tele preop appt. Recording comes on call cannot be completed.   I will update the requesting office the pt needs to call back and reschedule his missed tele preop appt.

## 2024-01-17 ENCOUNTER — Telehealth: Payer: Self-pay

## 2024-01-17 NOTE — Telephone Encounter (Signed)
 Appt scheduled for 01/19/24 @ 3:20am. Med rec and consent complete. Call patient at [phone] or wife's cell at [phone].

## 2024-01-17 NOTE — Telephone Encounter (Signed)
  Patient Consent for Virtual Visit        Seth Guzman has provided verbal consent on 01/17/2024 for a virtual visit (video or telephone).  Appt scheduled for 01/19/24 @ 3:20am. Med rec and consent complete. Call patient at [phone] or wife's cell at [phone].        CONSENT FOR VIRTUAL VISIT FOR:  Seth Guzman  By participating in this virtual visit I agree to the following:  I hereby voluntarily request, consent and authorize [facility] and its employed or contracted physicians, physician assistants, nurse practitioners or other licensed health care professionals (the Practitioner), to provide me with telemedicine health care services (the "Services") as deemed necessary by the treating Practitioner. I acknowledge and consent to receive the Services by the Practitioner via telemedicine. I understand that the telemedicine visit will involve communicating with the Practitioner through live audiovisual communication technology and the disclosure of certain medical information by electronic transmission. I acknowledge that I have been given the opportunity to request an in-person assessment or other available alternative prior to the telemedicine visit and am voluntarily participating in the telemedicine visit.  I understand that I have the right to withhold or withdraw my consent to the use of telemedicine in the course of my care at any time, without affecting my right to future care or treatment, and that the Practitioner or I may terminate the telemedicine visit at any time. I understand that I have the right to inspect all information obtained and/or recorded in the course of the telemedicine visit and may receive copies of available information for a reasonable fee.  I understand that some of the potential risks of receiving the Services via telemedicine include:  Delay or interruption in medical evaluation due to technological equipment failure or disruption; Information  transmitted may not be sufficient (e.g. poor resolution of images) to allow for appropriate medical decision making by the Practitioner; and/or  In rare instances, security protocols could fail, causing a breach of personal health information.  Furthermore, I acknowledge that it is my responsibility to provide information about my medical history, conditions and care that is complete and accurate to the best of my ability. I acknowledge that Practitioner's advice, recommendations, and/or decision may be based on factors not within their control, such as incomplete or inaccurate data provided by me or distortions of diagnostic images or specimens that may result from electronic transmissions. I understand that the practice of medicine is not an exact science and that Practitioner makes no warranties or guarantees regarding treatment outcomes. I acknowledge that a copy of this consent can be made available to me via my patient portal [facility] MyChart), or I can request a printed copy by calling the office of [facility].    I understand that my insurance will be billed for this visit.   I have read or had this consent read to me. I understand the contents of this consent, which adequately explains the benefits and risks of the Services being provided via telemedicine.  I have been provided ample opportunity to ask questions regarding this consent and the Services and have had my questions answered to my satisfaction. I give my informed consent for the services to be provided through the use of telemedicine in my medical care

## 2024-01-19 ENCOUNTER — Ambulatory Visit: Attending: Cardiology | Admitting: Emergency Medicine

## 2024-01-19 DIAGNOSIS — Z01810 Encounter for preprocedural cardiovascular examination: Secondary | ICD-10-CM | POA: Diagnosis not present

## 2024-01-19 NOTE — Progress Notes (Signed)
 Virtual Visit via Telephone Note   Because of Seth Guzman co-morbid illnesses, he is at least at moderate risk for complications without adequate follow up.  This format is felt to be most appropriate for this patient at this time.  Due to technical limitations with video connection (technology), today's appointment will be conducted as an audio only telehealth visit, and Seth Guzman verbally agreed to proceed in this manner.   All issues noted in this document were discussed and addressed.  No physical exam could be performed with this format.  Evaluation Performed:  Preoperative cardiovascular risk assessment _____________   Date:  01/19/2024   Patient ID:  Seth Guzman, DOB 06-07-1946, MRN [id number] Patient Location:  Home Provider location:   Office  Primary Care Provider:  Rosslyn Coons, MD Primary Cardiologist:  Seth Batman, MD  Chief Complaint / Patient Profile   78 y.o. y/o male with a h/o diabetes mellitus, hypertension, hyperlipidemia, prostate cancer, seizures, arthritis, anxiety/depression, gout, ischemic cardiomyopathy, coronary artery disease who is pending right carpal tunnel release, right ulnar nerve decompression in situ at the elbow with [facility] by Dr. Aloha Guzman on 01/25/2024 and presents today for telephonic preoperative cardiovascular risk assessment.  History of Present Illness    Seth Guzman is a 78 y.o. male who presents via audio/video conferencing for a telehealth visit today.  Pt was last seen in cardiology clinic on 10/19/2023 by Dr Seth Guzman.  At that time Seth Guzman was doing well.  The patient is now pending procedure as outlined above. Since his last visit, he denies chest pain, shortness of breath, lower extremity edema, fatigue, palpitations, melena, hematuria, hemoptysis, diaphoresis, weakness, presyncope, syncope, orthopnea, and PND.  Today patient is doing well overall.  He is without any acute cardiovascular concerns or complaints  at this time.  Denies any chest pain, shortness of breath, or any other exertional symptoms.  Notes he does stay fairly active.  He walks daily, does household work, and yard work without any exertional symptoms.  Overall he is able to complete greater than 4 METS.  Past Medical History    Past Medical History:  Diagnosis Date   Anxiety    Arthritis    CAD in native artery    a. cath 2006 - mod CAD, med rx. b. cath 2016 -  20% prox-mid RCA, 30% ost-prox LAD, 30% mLAd, 99% distal LAD-2, 90% distal LAD-1, 50% D2, 70% D3 -> the LAD disease was small caliber/apical and too small for PCI. EF was 45-50%   Depression    Diabetes mellitus type 2 in nonobese (HCC)    GERD (gastroesophageal reflux disease)    Gout    Heart murmur    HTN (hypertension)    Hx of radiation therapy 01/08/14- 03/01/14   prostate 7600 cGy 38 sessions   Hyperlipidemia    Inguinal hernia    recurrent right inguinal hernia   Ischemic cardiomyopathy    a. EF 45-50% by cath 2016.   Meatal stenosis 1966, 1967   Prostate cancer (HCC) 08/10/13   Gleason 6, volume 28.67 cc   Seizures (HCC)    Past Surgical History:  Procedure Laterality Date   CARDIAC CATHETERIZATION N/A 01/14/2015   Procedure: Left Heart Cath and Coronary Angiography;  Surgeon: Seth Benne, MD;  Location: [facility] INVASIVE CV LAB;  Service: Cardiovascular;  Laterality: N/A;   HAND SURGERY     INGUINAL HERNIA REPAIR  1966, 1967   bilateral herniorrphoes   LEFT HEART  CATH AND CORONARY ANGIOGRAPHY N/A 08/14/2019   Procedure: LEFT HEART CATH AND CORONARY ANGIOGRAPHY;  Surgeon: Seth Benne, MD;  Location: [facility] INVASIVE CV LAB;  Service: Cardiovascular;  Laterality: N/A;   PROSTATE BIOPSY  08/10/13   gleason 3+3=6, vol 28.67 cc    Allergies  Allergies  Allergen Reactions   Vicodin [Hydrocodone-Acetaminophen ] Other (See Comments)    dizzy and sick feeling/Knocks him out    Home Medications    Prior to Admission medications    Medication Sig Start Date End Date Taking? Authorizing Provider  amLODipine (NORVASC) 5 MG tablet Take 5 mg by mouth daily.    [provider]  aspirin  EC 81 MG tablet Take 81 mg by mouth daily.    [provider]  carvedilol  (COREG ) 6.25 MG tablet Take 6.25 mg by mouth 2 (two) times daily with a meal.     [provider]  Coenzyme Q10 200 MG capsule Take 200 mg by mouth daily.    [provider]  Empagliflozin-metFORMIN HCl (SYNJARDY) 12.12-998 MG TABS Take 1 tablet by mouth daily.    [provider]  escitalopram (LEXAPRO) 10 MG tablet Take 10 mg by mouth daily.    [provider]  fish oil-omega-3 fatty acids 1000 MG capsule Take 1 g by mouth daily.     [provider]  gabapentin (NEURONTIN) 300 MG capsule Take 300 mg by mouth 3 (three) times daily.    [provider]  glimepiride (AMARYL) 4 MG tablet Take 4 mg by mouth 2 (two) times daily.     [provider]  glucose blood (ONETOUCH VERIO) test strip check blood sugars 2 times  a day Dx E11.51 In Vitro 12/16/18   [provider]  isosorbide  mononitrate (IMDUR ) 60 MG 24 hr tablet TAKE 1/2 TABLET BY MOUTH DAILY 02/11/23   Guzman, Seth S, NP  Multiple Vitamins-Minerals (MULTIVITAMIN WITH MINERALS) tablet Take 1 tablet by mouth daily.    [provider]  naproxen sodium (ANAPROX) 220 MG tablet Take 440 mg by mouth 2 (two) times daily as needed (back pain).    [provider]  nitroGLYCERIN  (NITROSTAT ) 0.4 MG SL tablet Place 1 tablet (0.4 mg total) under the tongue every 5 (five) minutes as needed for chest pain. 10/03/14   Seth Benne, MD  oxyCODONE  (OXY IR/ROXICODONE ) 5 MG immediate release tablet Take 5 mg by mouth every 6 (six) hours as needed for severe pain (pain score 7-10).    [provider]  rosuvastatin  (CRESTOR ) 20 MG tablet Take 1 tablet (20 mg total) by mouth daily. 09/16/23   Seth Benne, MD     Physical Exam    Vital Signs:  Seth Guzman does not have vital signs available for review today.  Given telephonic nature of communication, physical exam is limited. AAOx3. NAD. Normal affect.  Speech and respirations are unlabored.  Accessory Clinical Findings    None  Assessment & Plan    1.  Preoperative Cardiovascular Risk Assessment: According to the Revised Cardiac Risk Index (RCRI), his Perioperative Risk of Major Cardiac Event is (%): 0.9. His Functional Capacity in METs is: 5.62 according to the Duke Activity Status Index (DASI). Therefore, based on ACC/AHA guidelines, patient would be at acceptable risk for the planned procedure without further cardiovascular testing.   The patient was advised that if he develops new symptoms prior to surgery to contact our office to arrange for a follow-up visit, and he verbalized understanding.  Ideally aspirin  should be continued without interruption, however if the bleeding risk is too great, aspirin  may be held for 5-7 days prior to surgery. Please resume aspirin  post operatively when it is felt to be safe from a bleeding standpoint.    A copy of this note will be routed to requesting surgeon.  Time:   Today, I have spent 7 minutes with the patient with telehealth technology discussing medical history, symptoms, and management plan.     Ava Boatman, NP  01/19/2024, 3:41 PM

## 2024-01-20 ENCOUNTER — Other Ambulatory Visit: Payer: Self-pay

## 2024-01-20 ENCOUNTER — Encounter (HOSPITAL_COMMUNITY): Payer: Self-pay | Admitting: Orthopedic Surgery

## 2024-01-20 NOTE — Progress Notes (Signed)
 PCP - Dr Rosslyn Coons Cardiologist - Dr Antoinette Batman (clearance on 01/19/24)  Chest x-ray - n/a EKG - 10/19/23 Stress Test - 10/01/14 ECHO - n/a Cardiac Cath - 08/14/19  ICD Pacemaker/Loop - n/a  Sleep Study -  n/a  Diabetes Type 2 Do not take Glimepiride Monday evening dose or dose on the morning of surgery.  Hold Synjardy 72 hours prior to procedure.  Last dose will be on 01/21/24.  If your blood sugar is less than 70 mg/dL, you will need to treat for low blood sugar: Treat a low blood sugar (less than 70 mg/dL) with  cup of clear juice (cranberry or apple), 4 glucose tablets, OR glucose gel. Recheck blood sugar in 15 minutes after treatment (to make sure it is greater than 70 mg/dL). If your blood sugar is not greater than 70 mg/dL on recheck, call [phone] for further instructions.  Blood Thinner Instructions:  n/a  Aspirin  Instructions: Follow your surgeon's instructions on when to stop aspirin  prior to surgery,  If no instructions were given by your surgeon then you will need to call the office for those instructions.  ERAS - clear liquids til 9:15 AM DOS.  Anesthesia review: Yes  STOP now taking any Aspirin  (unless otherwise instructed by your surgeon), Aleve, Naproxen, Ibuprofen, Motrin, Advil, Goody's, BC's, all herbal medications, fish oil, and all vitamins.   Coronavirus Screening Does the patient have any of the following symptoms:  Cough yes/no: No Fever (>100.57F)  yes/no: No Runny nose yes/no: No Sore throat yes/no: No Difficulty breathing/shortness of breath  yes/no: No  Has the patient traveled in the last 14 days and where? yes/no: No  Patient's wife Nellie Banas verbalized understanding of instructions that were given via phone.

## 2024-01-21 NOTE — Progress Notes (Signed)
 Anesthesia Chart Review:  Case: [id number] Date/Time: 01/25/24 1200   Procedure: RELEASE, CARPAL TUNNEL AND CUBITAL TUNNEL (Right) - Right carpal tunnel release.  Right ulnar nerve decompression in situ at the elbow.   Anesthesia type: General   Diagnosis: Carpal tunnel syndrome on right [G56.01]   Pre-op diagnosis: Right carpal tunnel syndrome.  Right ulnar nerve compression at the elbow.   [facility] / [facility]   Surgeons: Ronn Cohn, MD       DISCUSSION: Patient is a 78 year old male scheduled for the above procedure.  History includes never smoker, HTN, HLD, murmur ("No murmurs" by 10/19/23 exam by Dr. Abel Hoe), CAD (medical management 2020), ischemic cardiomyopathy (LVEF 45-50%, 1V CAD with 90-99% dLAD too small for PCI 12/2014), DM2, prostate cancer (07/2013, s/p radiation), seizure (x1 2008),  Preoperative cardiology evaluation on 01/19/24 by Palmer Bobo, NP.  He was doing well without Cv symptoms. Reported being fairly active, able to walk daily and do household and yard work. He wrote: "Preoperative Cardiovascular Risk Assessment: According to the Revised Cardiac Risk Index (RCRI), his Perioperative Risk of Major Cardiac Event is (%): 0.9. His Functional Capacity in METs is: 5.62 according to the Duke Activity Status Index (DASI). Therefore, based on ACC/AHA guidelines, patient would be at acceptable risk for the planned procedure without further cardiovascular testing...  Ideally aspirin  should be continued without interruption, however if the bleeding risk is too great, aspirin  may be held for 5-7 days prior to surgery. Please resume aspirin  post operatively when it is felt to be safe from a bleeding standpoint."   A1c 10.5% on 01/12/24 per Care Everywhere. Appears Dr. Jesse Moritz added Synjardy 12.12-998 mg daily to his regimen of Amaryl 4 mg BID. Message left for [facility] at Dr. Kathyanne Parkers office regarding above. Last pre-operative Synjardy dose planned for 01/21/24.   He will  get labs and anesthesia team evaluation on the day of surgery.    VS: Ht 5\' 11"  (1.803 m)   Wt 70.8 kg   BMI 21.76 kg/m  BP Readings from Last 3 Encounters:  10/19/23 (!) 150/78  05/18/22 130/72  12/18/21 124/66   Pulse Readings from Last 3 Encounters:  10/19/23 70  05/18/22 73  12/18/21 66     PROVIDERS: Rosslyn Coons, MD is PCP at [facility]) Antoinette Batman, MD is cardiologist   LABS: For day of surgery as indicated. Last results noted in [facility] Everywhere include A1c 10.5% 01/12/24, TSH 0.564, FT4 1.53 on 01/20/24,    EKG: 10/19/23: Sinus rhythm with sinus arrhythmia with 1st degree A-V block Confirmed by Antoinette Batman [phone]) on 10/19/2023 2:43:18 PM   CV: LHC 08/14/19: Ost LAD to Prox LAD lesion is 30% stenosed. Mid LAD lesion is 30% stenosed. Dist LAD-1 lesion is 90% stenosed. Dist LAD-2 lesion is 99% stenosed. 2nd Diag lesion is 50% stenosed. Ost 3rd Diag to 3rd Diag lesion is 70% stenosed. Prox RCA lesion is 20% stenosed. Mid RCA lesion is 40% stenosed. Ramus lesion is 30% stenosed.   1. The LAD course to the apex. The LAD becomes a small caliber vessel distally. The distal and apical LAD has diffuse, severe disease, unchanged form last cath and too small for PCI.  2. Diffuse disease in the small caliber diagonal branch, too small for PCI. Unchanged from last cath 3. Mild non-obstructive disease in the large ramus intermediate branch 4. Mild non-obstructive disease in the mid RCA 5. Overall preserved LV systolic function with mild hypokinesis of the  apex, unchanged from last cath    Recommendations: Continue medical management of CAD   US  Carotid 04/12/19: Summary:  - Right Carotid: Velocities in the right ICA are consistent with a 1-39% stenosis.  - Left Carotid: There is no evidence of stenosis in the left ICA.  - Vertebrals:  Bilateral vertebral arteries demonstrate antegrade flow.  - Subclavians: Normal flow  hemodynamics were seen in bilateral subclavian arteries.    Myoview 10/01/14: Intermediate risk stress nuclear study with no ischemia and a fixed defect secondary to artifact (normal wall motion in the area). There is diffuse left ventricular hypokinesis with overall LVEF 44%. Echocardiogram to confirm LV dysfunction is recommended.   LV Ejection Fraction: 44%.  LV Wall Motion:  Diffuse hypokinesis.  - Referred for [facility] 01/14/15: 20% prox-mid RCA, 30% ost-prox LM, 30% mLAD, 99% & 90% dLAD, 50% D2, 70% ost D3. LVEF 45-50% by visual estimate, no WMA. Single vessel CAD with diffuse disease in small caliber distal and apical LAD, too small for PCI, diffuse disease in small caliber DIAG, too small for PCI. Continue medical management.   Past Medical History:  Diagnosis Date   Anxiety    Arthritis    CAD in native artery    a. cath 2006 - mod CAD, med rx. b. cath 2016 -  20% prox-mid RCA, 30% ost-prox LAD, 30% mLAd, 99% distal LAD-2, 90% distal LAD-1, 50% D2, 70% D3 -> the LAD disease was small caliber/apical and too small for PCI. EF was 45-50%   Complication of anesthesia    slow to wake up after hernia surgery at the surgical center   Depression    Diabetes mellitus type 2 in nonobese [facility])    GERD (gastroesophageal reflux disease)    Gout    Heart murmur    HTN (hypertension)    Hx of radiation therapy 01/08/14- 03/01/14   prostate 7600 cGy 38 sessions   Hyperlipidemia    Inguinal hernia    recurrent right inguinal hernia   Ischemic cardiomyopathy    a. EF 45-50% by cath 2016.   Meatal stenosis 1966, 1967   Pneumonia    x 1   Prostate cancer (HCC) 08/10/2013   Gleason 6, volume 28.67 cc   Seizures (HCC)    x 1 - yrs ago per patient    Past Surgical History:  Procedure Laterality Date   CARDIAC CATHETERIZATION N/A 01/14/2015   Procedure: Left Heart Cath and Coronary Angiography;  Surgeon: Odie Benne, MD;  Location: [facility] INVASIVE CV LAB;  Service: Cardiovascular;  Laterality:  N/A;   HAND SURGERY     INGUINAL HERNIA REPAIR  1966, 1967   bilateral herniorrphoes   LEFT HEART CATH AND CORONARY ANGIOGRAPHY N/A 08/14/2019   Procedure: LEFT HEART CATH AND CORONARY ANGIOGRAPHY;  Surgeon: Odie Benne, MD;  [facility] INVAS[facility] CV LAB;  Service: Cardiovascular;  Laterality: N/A;   PROSTATE BIOPSY  08/10/13   gleason 3+3=6, vol 28.67 cc    MEDICATIONS: No current facility-administered medications for this encounter.    amLODipine (NORVASC) 5 MG tablet   aspirin  EC 81 MG tablet   carvedilol  (COREG ) 6.25 MG tablet   Coenzyme Q10 200 MG capsule   Empagliflozin-metFORMIN HCl (SYNJARDY) 12.12-998 MG TABS   escitalopram (LEXAPRO) 10 MG tablet   fish oil-omega-3 fatty acids 1000 MG capsule   gabapentin (NEURONTIN) 300 MG capsule   glimepiride (AMARYL) 4 MG tablet   isosorbide  mononitrate (IMDUR ) 60 MG 24 hr tablet   Multiple  Vitamins-Minerals (MULTIVITAMIN WITH MINERALS) tablet   naproxen sodium (ANAPROX) 220 MG tablet   nitroGLYCERIN  (NITROSTAT ) 0.4 MG SL tablet   oxyCODONE  (OXY IR/ROXICODONE ) 5 MG immediate release tablet   rosuvastatin  (CRESTOR ) 20 MG tablet   glucose blood (ONETOUCH VERIO) test strip   He was advised to follow surgeon instructions regarding perioperative ASA.    Ella Gun, PA-C Surgical Short Stay/Anesthesiology [facility] Phone [phone] [facility] Phone [phone] 01/21/2024 11:22 AM

## 2024-01-21 NOTE — Anesthesia Preprocedure Evaluation (Signed)
 Anesthesia Evaluation  Patient identified by MRN, date of birth, ID band Patient awake    Reviewed: Allergy & Precautions, H&P , NPO status , Patient's Chart, lab work & pertinent test results  Airway Mallampati: II  TM Distance: >3 FB Neck ROM: Full    Dental  (+) Edentulous Upper, Poor Dentition, Missing   Pulmonary neg pulmonary ROS   Pulmonary exam normal breath sounds clear to auscultation       Cardiovascular hypertension, Pt. on medications + CAD  Normal cardiovascular exam Rhythm:Regular Rate:Normal     Neuro/Psych Seizures -,   Anxiety Depression     negative psych ROS   GI/Hepatic Neg liver ROS,GERD  ,,  Endo/Other  diabetes, Type 2    Renal/GU negative Renal ROS  negative genitourinary   Musculoskeletal  (+) Arthritis ,    Abdominal   Peds negative pediatric ROS (+)  Hematology negative hematology ROS (+)   Anesthesia Other Findings   Reproductive/Obstetrics negative OB ROS                             Anesthesia Physical Anesthesia Plan  ASA: 3  Anesthesia Plan: General   Post-op Pain Management:    Induction: Intravenous  PONV Risk Score and Plan: 2 and Ondansetron , Midazolam  and Treatment may vary due to age or medical condition  Airway Management Planned: LMA  Additional Equipment:   Intra-op Plan:   Post-operative Plan: Extubation in OR  Informed Consent: I have reviewed the patients History and Physical, chart, labs and discussed the procedure including the risks, benefits and alternatives for the proposed anesthesia with the patient or authorized representative who has indicated his/her understanding and acceptance.     Dental advisory given  Plan Discussed with: CRNA  Anesthesia Plan Comments: (PAT note written 01/21/2024 by Lamesha Tibbits, PA-C.  )       Anesthesia Quick Evaluation

## 2024-01-25 ENCOUNTER — Ambulatory Visit (HOSPITAL_BASED_OUTPATIENT_CLINIC_OR_DEPARTMENT_OTHER): Admitting: Vascular Surgery

## 2024-01-25 ENCOUNTER — Ambulatory Visit (HOSPITAL_COMMUNITY): Admitting: Vascular Surgery

## 2024-01-25 ENCOUNTER — Other Ambulatory Visit: Payer: Self-pay

## 2024-01-25 ENCOUNTER — Encounter (HOSPITAL_COMMUNITY)
Admission: RE | Disposition: A | Discharge status: Home / Self Care | Payer: Self-pay | Source: Home / Self Care | Attending: Orthopedic Surgery

## 2024-01-25 ENCOUNTER — Ambulatory Visit (HOSPITAL_COMMUNITY)
Admission: RE | Admit: 2024-01-25 | Discharge: 2024-01-25 | Disposition: A | Discharge status: Home / Self Care | Attending: Orthopedic Surgery | Admitting: Orthopedic Surgery

## 2024-01-25 DIAGNOSIS — Z7984 Long term (current) use of oral hypoglycemic drugs: Secondary | ICD-10-CM | POA: Diagnosis not present

## 2024-01-25 DIAGNOSIS — I2510 Atherosclerotic heart disease of native coronary artery without angina pectoris: Secondary | ICD-10-CM | POA: Insufficient documentation

## 2024-01-25 DIAGNOSIS — G5601 Carpal tunnel syndrome, right upper limb: Secondary | ICD-10-CM | POA: Diagnosis present

## 2024-01-25 DIAGNOSIS — K219 Gastro-esophageal reflux disease without esophagitis: Secondary | ICD-10-CM | POA: Diagnosis not present

## 2024-01-25 DIAGNOSIS — E119 Type 2 diabetes mellitus without complications: Secondary | ICD-10-CM | POA: Diagnosis not present

## 2024-01-25 DIAGNOSIS — F419 Anxiety disorder, unspecified: Secondary | ICD-10-CM | POA: Insufficient documentation

## 2024-01-25 DIAGNOSIS — Z955 Presence of coronary angioplasty implant and graft: Secondary | ICD-10-CM | POA: Diagnosis not present

## 2024-01-25 DIAGNOSIS — Z833 Family history of diabetes mellitus: Secondary | ICD-10-CM | POA: Insufficient documentation

## 2024-01-25 DIAGNOSIS — Z79899 Other long term (current) drug therapy: Secondary | ICD-10-CM | POA: Insufficient documentation

## 2024-01-25 DIAGNOSIS — I255 Ischemic cardiomyopathy: Secondary | ICD-10-CM | POA: Diagnosis not present

## 2024-01-25 DIAGNOSIS — Z7982 Long term (current) use of aspirin: Secondary | ICD-10-CM | POA: Diagnosis not present

## 2024-01-25 DIAGNOSIS — R569 Unspecified convulsions: Secondary | ICD-10-CM | POA: Diagnosis not present

## 2024-01-25 DIAGNOSIS — F32A Depression, unspecified: Secondary | ICD-10-CM | POA: Diagnosis not present

## 2024-01-25 DIAGNOSIS — E785 Hyperlipidemia, unspecified: Secondary | ICD-10-CM | POA: Insufficient documentation

## 2024-01-25 DIAGNOSIS — M1990 Unspecified osteoarthritis, unspecified site: Secondary | ICD-10-CM | POA: Insufficient documentation

## 2024-01-25 DIAGNOSIS — G5621 Lesion of ulnar nerve, right upper limb: Secondary | ICD-10-CM | POA: Diagnosis not present

## 2024-01-25 DIAGNOSIS — I10 Essential (primary) hypertension: Secondary | ICD-10-CM | POA: Diagnosis not present

## 2024-01-25 HISTORY — DX: Pneumonia, unspecified organism: J18.9

## 2024-01-25 HISTORY — PX: CARPAL TUNNEL WITH CUBITAL TUNNEL: SHX5608

## 2024-01-25 HISTORY — DX: Other complications of anesthesia, initial encounter: T88.59XA

## 2024-01-25 LAB — BASIC METABOLIC PANEL WITH GFR
Anion gap: 12 (ref 5–15)
BUN: 12 mg/dL (ref 8–23)
CO2: 23 mmol/L (ref 22–32)
Calcium: 9.5 mg/dL (ref 8.9–10.3)
Chloride: 101 mmol/L (ref 98–111)
Creatinine, Ser: 0.98 mg/dL (ref 0.61–1.24)
GFR, Estimated: >60 mL/min (ref >60)
Glucose, Bld: 117 mg/dL — ABNORMAL HIGH (ref 70–99)
Potassium: 4 mmol/L (ref 3.5–5.1)
Sodium: 136 mmol/L (ref 135–145)

## 2024-01-25 LAB — GLUCOSE, CAPILLARY
Glucose-Capillary: 116 mg/dL — ABNORMAL HIGH (ref 70–99)
Glucose-Capillary: 76 mg/dL (ref 70–99)
Glucose-Capillary: 67 mg/dL — ABNORMAL LOW (ref 70–99)
Glucose-Capillary: 87 mg/dL (ref 70–99)
Glucose-Capillary: 67 mg/dL — ABNORMAL LOW (ref 70–99)
Glucose-Capillary: 95 mg/dL (ref 70–99)

## 2024-01-25 LAB — CBC
HCT: 48.7 % (ref 39.0–52.0)
Hemoglobin: 15.7 g/dL (ref 13.0–17.0)
MCH: 27.2 pg (ref 26.0–34.0)
MCHC: 32.2 g/dL (ref 30.0–36.0)
MCV: 84.3 fL (ref 80.0–100.0)
Platelets: 349 10*3/uL (ref 150–400)
RBC: 5.78 MIL/uL (ref 4.22–5.81)
RDW: 13 % (ref 11.5–15.5)
WBC: 13.7 10*3/uL — ABNORMAL HIGH (ref 4.0–10.5)
nRBC: 0 % (ref 0.0–0.2)

## 2024-01-25 LAB — NO BLOOD PRODUCTS

## 2024-01-25 SURGERY — RELEASE, CARPAL TUNNEL AND CUBITAL TUNNEL
Anesthesia: General | Site: Arm Lower | Laterality: Right

## 2024-01-25 MED ORDER — LIDOCAINE 2% (20 MG/ML) 5 ML SYRINGE
INTRAMUSCULAR | Status: DC | PRN
  Administered 2024-01-25: 60 mg via INTRAVENOUS

## 2024-01-25 MED ORDER — SODIUM CHLORIDE 0.9 % IV SOLN: 12.5000 mg | INTRAVENOUS | Status: DC | PRN

## 2024-01-25 MED ORDER — BUPIVACAINE HCL (PF) 0.25 % IJ SOLN
INTRAMUSCULAR | Status: AC
  Filled 2024-01-25: qty 30

## 2024-01-25 MED ORDER — ORAL CARE MOUTH RINSE: 15.0000 mL | Freq: Once | OROMUCOSAL | Status: AC

## 2024-01-25 MED ORDER — INSULIN ASPART 100 UNIT/ML IJ SOLN: 0.0000 [IU] | INTRAMUSCULAR | Status: DC | PRN

## 2024-01-25 MED ORDER — OXYCODONE HCL 5 MG/5ML PO SOLN: 5.0000 mg | Freq: Once | ORAL | Status: DC | PRN

## 2024-01-25 MED ORDER — AMISULPRIDE (ANTIEMETIC) 5 MG/2ML IV SOLN
10.0000 mg | Freq: Once | INTRAVENOUS | Status: DC | PRN
Start: 2024-01-25 — End: 2024-01-25

## 2024-01-25 MED ORDER — PROPOFOL 10 MG/ML IV BOLUS
INTRAVENOUS | Status: AC
  Filled 2024-01-25: qty 20

## 2024-01-25 MED ORDER — FENTANYL CITRATE (PF) 250 MCG/5ML IJ SOLN
INTRAMUSCULAR | Status: AC
  Filled 2024-01-25: qty 5

## 2024-01-25 MED ORDER — FENTANYL CITRATE (PF) 250 MCG/5ML IJ SOLN
INTRAMUSCULAR | Status: DC | PRN
  Administered 2024-01-25 (×3): 50 ug via INTRAVENOUS

## 2024-01-25 MED ORDER — EPHEDRINE SULFATE-NACL 50-0.9 MG/10ML-% IV SOSY
PREFILLED_SYRINGE | INTRAVENOUS | Status: DC | PRN
  Administered 2024-01-25 (×2): 5 mg via INTRAVENOUS

## 2024-01-25 MED ORDER — PROPOFOL 500 MG/50ML IV EMUL
INTRAVENOUS | Status: DC | PRN
Start: 2024-01-25 — End: 2024-01-25
  Administered 2024-01-25: 150 ug/kg/min via INTRAVENOUS

## 2024-01-25 MED ORDER — BUPIVACAINE HCL 0.25 % IJ SOLN
INTRAMUSCULAR | Status: DC | PRN
  Administered 2024-01-25: 10 mL

## 2024-01-25 MED ORDER — CEFAZOLIN SODIUM-DEXTROSE 2-4 GM/100ML-% IV SOLN
2.0000 g | INTRAVENOUS | Status: AC
  Administered 2024-01-25: 2 g via INTRAVENOUS
  Filled 2024-01-25: qty 100

## 2024-01-25 MED ORDER — DEXAMETHASONE SODIUM PHOSPHATE 10 MG/ML IJ SOLN
INTRAMUSCULAR | Status: AC
  Filled 2024-01-25: qty 1

## 2024-01-25 MED ORDER — LIDOCAINE 2% (20 MG/ML) 5 ML SYRINGE
INTRAMUSCULAR | Status: AC
  Filled 2024-01-25: qty 5

## 2024-01-25 MED ORDER — LACTATED RINGERS IV SOLN: INTRAVENOUS | Status: DC

## 2024-01-25 MED ORDER — EPHEDRINE 5 MG/ML INJ
INTRAVENOUS | Status: AC
  Filled 2024-01-25: qty 5

## 2024-01-25 MED ORDER — OXYCODONE HCL 5 MG PO TABS: 5.0000 mg | ORAL_TABLET | Freq: Once | ORAL | Status: DC | PRN

## 2024-01-25 MED ORDER — CHLORHEXIDINE GLUCONATE 0.12 % MT SOLN
15.0000 mL | Freq: Once | OROMUCOSAL | Status: AC
  Administered 2024-01-25: 15 mL via OROMUCOSAL
  Filled 2024-01-25: qty 15

## 2024-01-25 MED ORDER — HYDROMORPHONE HCL 1 MG/ML IJ SOLN: 0.2500 mg | INTRAMUSCULAR | Status: DC | PRN

## 2024-01-25 MED ORDER — PROPOFOL 10 MG/ML IV BOLUS
INTRAVENOUS | Status: DC | PRN
  Administered 2024-01-25: 150 mg via INTRAVENOUS
  Administered 2024-01-25: 30 mg via INTRAVENOUS

## 2024-01-25 MED ORDER — PHENYLEPHRINE 80 MCG/ML (10ML) SYRINGE FOR IV PUSH (FOR BLOOD PRESSURE SUPPORT)
PREFILLED_SYRINGE | INTRAVENOUS | Status: DC | PRN
  Administered 2024-01-25 (×2): 80 ug via INTRAVENOUS

## 2024-01-25 MED ORDER — 0.9 % SODIUM CHLORIDE (POUR BTL) OPTIME
TOPICAL | Status: DC | PRN
  Administered 2024-01-25: 1000 mL

## 2024-01-25 MED ORDER — ONDANSETRON HCL 4 MG/2ML IJ SOLN
INTRAMUSCULAR | Status: AC
  Filled 2024-01-25: qty 2

## 2024-01-25 MED ORDER — ONDANSETRON HCL 4 MG/2ML IJ SOLN
INTRAMUSCULAR | Status: DC | PRN
  Administered 2024-01-25: 4 mg via INTRAVENOUS

## 2024-01-25 SURGICAL SUPPLY — 46 items
BAG COUNTER SPONGE SURGICOUNT (BAG) ×1 IMPLANT
BNDG ELASTIC 2INX 5YD STR LF (GAUZE/BANDAGES/DRESSINGS) IMPLANT
BNDG ELASTIC 3INX 5YD STR LF (GAUZE/BANDAGES/DRESSINGS) ×2 IMPLANT
BNDG ELASTIC 4INX 5YD STR LF (GAUZE/BANDAGES/DRESSINGS) IMPLANT
BNDG ELASTIC 4X5.8 VLCR STR LF (GAUZE/BANDAGES/DRESSINGS) ×1 IMPLANT
BNDG GAUZE DERMACEA FLUFF 4 (GAUZE/BANDAGES/DRESSINGS) ×3 IMPLANT
BNDG STRETCH GAUZE 3IN X12FT (GAUZE/BANDAGES/DRESSINGS) IMPLANT
CORD BIPOLAR FORCEPS 12FT (ELECTRODE) ×1 IMPLANT
COVER SURGICAL LIGHT HANDLE (MISCELLANEOUS) ×1 IMPLANT
CUFF TOURN SGL QUICK 18X4 (TOURNIQUET CUFF) ×1 IMPLANT
CUFF TRNQT CYL 24X4X16.5-23 (TOURNIQUET CUFF) IMPLANT
DRAPE SURG 17X23 STRL (DRAPES) ×1 IMPLANT
DRSG ADAPTIC 3X8 NADH LF (GAUZE/BANDAGES/DRESSINGS) IMPLANT
EVACUATOR 1/8 PVC DRAIN (DRAIN) IMPLANT
GAUZE SPONGE 4X4 12PLY STRL (GAUZE/BANDAGES/DRESSINGS) ×1 IMPLANT
GAUZE XEROFORM 1X8 LF (GAUZE/BANDAGES/DRESSINGS) ×1 IMPLANT
GAUZE XEROFORM 5X9 LF (GAUZE/BANDAGES/DRESSINGS) IMPLANT
GLOVE BIOGEL M 8.0 STRL (GLOVE) ×1 IMPLANT
GLOVE SS BIOGEL STRL SZ 8 (GLOVE) ×1 IMPLANT
GOWN STRL REUS W/ TWL LRG LVL3 (GOWN DISPOSABLE) ×2 IMPLANT
GOWN STRL REUS W/ TWL XL LVL3 (GOWN DISPOSABLE) ×3 IMPLANT
KIT BASIN OR (CUSTOM PROCEDURE TRAY) ×1 IMPLANT
KIT TURNOVER KIT B (KITS) ×1 IMPLANT
LOOP VASCLR MAXI BLUE 18IN ST (MISCELLANEOUS) IMPLANT
LOOPS VASCLR MAXI BLUE 18IN ST (MISCELLANEOUS) IMPLANT
NDL HYPO 25GX1X1/2 BEV (NEEDLE) IMPLANT
NEEDLE HYPO 25GX1X1/2 BEV (NEEDLE) IMPLANT
NS IRRIG 1000ML POUR BTL (IV SOLUTION) ×1 IMPLANT
PACK ORTHO EXTREMITY (CUSTOM PROCEDURE TRAY) ×1 IMPLANT
PAD ARMBOARD POSITIONER FOAM (MISCELLANEOUS) ×2 IMPLANT
PAD CAST 4YDX4 CTTN HI CHSV (CAST SUPPLIES) ×2 IMPLANT
PADDING CAST ABS COTTON 4X4 ST (CAST SUPPLIES) IMPLANT
SOL PREP POV-IOD 4OZ 10% (MISCELLANEOUS) ×3 IMPLANT
SPLINT FIBERGLASS 3X12 (CAST SUPPLIES) IMPLANT
SUCTION TUBE FRAZIER 10FR DISP (SUCTIONS) IMPLANT
SUT PROLENE 4 0 PS 2 18 (SUTURE) ×1 IMPLANT
SUT VIC AB 2-0 CT1 TAPERPNT 27 (SUTURE) IMPLANT
SUT VIC AB 3-0 FS2 27 (SUTURE) IMPLANT
SYR CONTROL 10ML LL (SYRINGE) IMPLANT
SYSTEM CHEST DRAIN TLS 7FR (DRAIN) IMPLANT
TOWEL GREEN STERILE (TOWEL DISPOSABLE) ×1 IMPLANT
TOWEL GREEN STERILE FF (TOWEL DISPOSABLE) ×1 IMPLANT
TUBE CONNECTING 12X1/4 (SUCTIONS) IMPLANT
TUBE EVACUATION TLS (MISCELLANEOUS) ×1 IMPLANT
UNDERPAD 30X36 HEAVY ABSORB (UNDERPADS AND DIAPERS) ×1 IMPLANT
WATER STERILE IRR 1000ML POUR (IV SOLUTION) ×1 IMPLANT

## 2024-01-25 NOTE — Op Note (Signed)
 Operative note 01/25/2024.  Joesphine Schemm MD.  Date of dictation operation 01/25/2024.  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  Procedure #1 right carpal tunnel release #2 ulnar nerve release in situ right elbow #3 palmaris longus tenotomy right wrist  Surgeon Frans Valente  Anesthesia General  Operation detail patient is a very pleasant 78 year old gentleman who has history of diabetes and multiple medical problems.  He has a autonomic response with loss of motion extreme pain and advanced changes.  He also has some degree of inflammatory change and is pending workup.  I performed initial laboratory evaluation including a increased parameter on inflammatory lab study.  His nerve studies have revealed advanced compression neuropathy.  Given these issues we are planning to proceed with release.  Operation detail patient was seen by myself and anesthesia.  I counseled him in regards to risk and benefits of surgery with his wife present.  Operation commenced with a LMA general anesthetic followed by prep and drape with Hibiclens scrub followed by Betadine scrub.  Arm was elevated tourniquet was insufflated to 250 mmHg and timeout was observed.  At this time I made an extended open incision about the carpal tunnel region.  The patient had very significant and thickened changes that were unusually advanced.  He had hourglass constriction of his nerve and significant changes proximally.  I performed distal release followed by distal proximal dissection and direct release.  I did cross the wrist with the incision given the very impressive thickened transverse carpal ligament.  The patient had hourglass constriction of the nerve and no complicating features.  I released him and portions of the antebrachial fascia.  Also excised portions of the radial leaflet.  This was an extended open release.  Identified the palmaris longus and  performed a palmaris longus tenotomy to ensure there were no compressive forces.  This was a palmaris longus tenotomy and carpal tunnel release.  Median nerve was hyperemic intact and had hourglass changes.  There was irrigated and closed after hemostasis was secured at the conclusion of the case.  Following this I turned attention towards the elbow dissection was carried down through an incision and ulnar nerve was released about the arcade of Struthers, medial to muscular septum, cubital tunnel and Osborne's ligament as well as the 2 heads of the FCU both superficial and deep.  There was no subluxation.  All looked well.  I deflated the tourniquet and closed wound after hemostasis was secured.  Both nerves are very hyperemic and has advanced changes.  Will await his rheumatologic workup for his inflammatory issues.  Hopefully getting the pressure off his nerves will give him some relief.  Discharge medicines include oxycodone  and his regular medicines as well as doxycycline for 7 days.  I will see him back in 13 days that in my cell phone for any issues problems or concerns.  Heatherly Stenner MD

## 2024-01-25 NOTE — Anesthesia Postprocedure Evaluation (Signed)
 Anesthesia Post Note  Patient: ELDREDGE VELDHUIZEN  Procedure(s) Performed: RIGHT CARPAL TUNNEL AND CUBITAL TUNNEL RELEASE AND REPAIR AS NECESSARY (Right: Arm Lower)     Patient location during evaluation: PACU Anesthesia Type: General Level of consciousness: awake and alert Pain management: pain level controlled Vital Signs Assessment: post-procedure vital signs reviewed and stable Respiratory status: spontaneous breathing, nonlabored ventilation and respiratory function stable Cardiovascular status: blood pressure returned to baseline and stable Postop Assessment: no apparent nausea or vomiting Anesthetic complications: no   No notable events documented.  Last Vitals:  Vitals:   01/25/24 1445 01/25/24 1500  BP: (!) 185/80 (!) 185/88  Pulse: 78 75  Resp: 14 13  Temp:  36.7 C  SpO2: 96% 97%    Last Pain:  Vitals:   01/25/24 1500  TempSrc:   PainSc: 0-No pain   Pain Goal: Patients Stated Pain Goal: 0 (01/25/24 1052)                 Earvin Goldberg

## 2024-01-25 NOTE — Discharge Instructions (Signed)
 Please call Dr. Crissie Dome for any problems at [phone]  We recommend that you to take vitamin C 1000 mg a day to promote healing. We also recommend that if you require  pain medicine that you take a stool softener to prevent constipation as most pain medicines will have constipation side effects. We recommend either Peri-Colace or Senokot and recommend that you also consider adding MiraLAX as well to prevent the constipation affects from pain medicine if you are required to use them. These medicines are over the counter and may be purchased at a local pharmacy. A cup of yogurt and a probiotic can also be helpful during the recovery process as the medicines can disrupt your intestinal environment.Keep bandage clean and dry.  Call for any problems.  No smoking.  Criteria for driving a car: you should be off your pain medicine for 7-8 hours, able to drive one handed(confident), thinking clearly and feeling able in your judgement to drive. Continue elevation as it will decrease swelling.  If instructed by MD move your fingers within the confines of the bandage/splint.  Use ice if instructed by your MD. Call immediately for any sudden loss of feeling in your hand/arm or change in functional abilities of the extremity.

## 2024-01-25 NOTE — Transfer of Care (Signed)
 Immediate Anesthesia Transfer of Care Note  Patient: Seth Guzman  Procedure(s) Performed: RIGHT CARPAL TUNNEL AND CUBITAL TUNNEL RELEASE AND REPAIR AS NECESSARY (Right: Arm Lower)  Patient Location: PACU  Anesthesia Type:General  Level of Consciousness: awake  Airway & Oxygen Therapy: Patient Spontanous Breathing  Post-op Assessment: Report given to RN and Post -op Vital signs reviewed and stable  Post vital signs: Reviewed and stable  Last Vitals:  Vitals Value Taken Time  BP 181/79 01/25/24 1421  Temp 36.7 C 01/25/24 1421  Pulse 76 01/25/24 1424  Resp 16 01/25/24 1424  SpO2 94 % 01/25/24 1424  Vitals shown include unfiled device data.  Last Pain:  Vitals:   01/25/24 1052  TempSrc:   PainSc: 7       Patients Stated Pain Goal: 0 (01/25/24 1052)  Complications: No notable events documented.

## 2024-01-25 NOTE — H&P (Signed)
 Seth Guzman is an 78 y.o. male.   Chief Complaint: Plan right carpal tunnel release and right ulnar nerve release at the elbow HPI: Patient presents for evaluation and treatment of the of their upper extremity predicament. The patient denies neck, back, chest or  abdominal pain. The patient notes that they have no lower extremity problems. The patients primary complaint is noted. We are planning surgical care pathway for the upper extremity.  Past Medical History:  Diagnosis Date   Anxiety    Arthritis    CAD in native artery    a. cath 2006 - mod CAD, med rx. b. cath 2016 -  20% prox-mid RCA, 30% ost-prox LAD, 30% mLAd, 99% distal LAD-2, 90% distal LAD-1, 50% D2, 70% D3 -> the LAD disease was small caliber/apical and too small for PCI. EF was 45-50%   Complication of anesthesia    slow to wake up after hernia surgery at the surgical center   Depression    Diabetes mellitus type 2 in nonobese [facility])    GERD (gastroesophageal reflux disease)    Gout    Heart murmur    HTN (hypertension)    Hx of radiation therapy 01/08/14- 03/01/14   prostate 7600 cGy 38 sessions   Hyperlipidemia    Inguinal hernia    recurrent right inguinal hernia   Ischemic cardiomyopathy    a. EF 45-50% by cath 2016.   Meatal stenosis 1966, 1967   Pneumonia    x 1   Prostate cancer (HCC) 08/10/2013   Gleason 6, volume 28.67 cc   Seizures (HCC)    x 1 - yrs ago per patient    Past Surgical History:  Procedure Laterality Date   CARDIAC CATHETERIZATION N/A 01/14/2015   Procedure: Left Heart Cath and Coronary Angiography;  Surgeon: Odie Benne, MD;  Location: [facility] INVASIVE CV LAB;  Service: Cardiovascular;  Laterality: N/A;   HAND SURGERY     INGUINAL HERNIA REPAIR  1966, 1967   bilateral herniorrphoes   LEFT HEART CATH AND CORONARY ANGIOGRAPHY N/A 08/14/2019   Procedure: LEFT HEART CATH AND CORONARY ANGIOGRAPHY;  Surgeon: Odie Benne, MD;  Location[facility] INVASIVE CV LAB;  Service:  Cardiovascular;  Laterality: N/A;   PROSTATE BIOPSY  08/10/13   gleason 3+3=6, vol 28.67 cc    Family History  Problem Relation Age of Onset   Diabetes Mother    Stroke Mother    Diabetes Sister    Heart attack Sister    Diabetes Brother    Cancer Father        larynx   Stroke Brother    Social History:  reports that he has never smoked. He has never used smokeless tobacco. He reports current alcohol use. He reports that he does not use drugs.  Allergies:  Allergies  Allergen Reactions   Vicodin [Hydrocodone-Acetaminophen ] Other (See Comments)    dizzy and sick feeling/Knocks him out    Medications Prior to Admission  Medication Sig Dispense Refill   amLODipine (NORVASC) 5 MG tablet Take 5 mg by mouth daily.     aspirin  EC 81 MG tablet Take 81 mg by mouth daily.     carvedilol  (COREG ) 6.25 MG tablet Take 6.25 mg by mouth 2 (two) times daily with a meal.      Coenzyme Q10 200 MG capsule Take 200 mg by mouth daily.     Empagliflozin-metFORMIN HCl (SYNJARDY) 12.12-998 MG TABS Take 1 tablet by mouth daily.     escitalopram (  LEXAPRO) 10 MG tablet Take 10 mg by mouth daily.     fish oil-omega-3 fatty acids 1000 MG capsule Take 1 g by mouth daily.      gabapentin (NEURONTIN) 300 MG capsule Take 300 mg by mouth 3 (three) times daily.     glimepiride (AMARYL) 4 MG tablet Take 4 mg by mouth 2 (two) times daily.      isosorbide  mononitrate (IMDUR ) 60 MG 24 hr tablet TAKE 1/2 TABLET BY MOUTH DAILY 90 tablet 1   Multiple Vitamins-Minerals (MULTIVITAMIN WITH MINERALS) tablet Take 1 tablet by mouth daily.     oxyCODONE  (OXY IR/ROXICODONE ) 5 MG immediate release tablet Take 5 mg by mouth every 6 (six) hours as needed for severe pain (pain score 7-10).     rosuvastatin  (CRESTOR ) 20 MG tablet Take 1 tablet (20 mg total) by mouth daily. 90 tablet 0   glucose blood (ONETOUCH VERIO) test strip check blood sugars 2 times  a day Dx E11.51 In Vitro     naproxen sodium (ANAPROX) 220 MG tablet Take  440 mg by mouth 2 (two) times daily as needed (back pain).     nitroGLYCERIN  (NITROSTAT ) 0.4 MG SL tablet Place 1 tablet (0.4 mg total) under the tongue every 5 (five) minutes as needed for chest pain. 25 tablet 6    Results for orders placed or performed during the hospital encounter of 01/25/24 (from the past 48 hours)  Glucose, capillary     Status: Abnormal   Collection Time: 01/25/24  9:43 AM  Result Value Ref Range   Glucose-Capillary 116 (H) 70 - 99 mg/dL    Comment: Glucose reference range applies only to samples taken after fasting for at least 8 hours.  Basic metabolic panel per protocol     Status: Abnormal   Collection Time: 01/25/24  9:50 AM  Result Value Ref Range   Sodium 136 135 - 145 mmol/L   Potassium 4.0 3.5 - 5.1 mmol/L   Chloride 101 98 - 111 mmol/L   CO2 23 22 - 32 mmol/L   Glucose, Bld 117 (H) 70 - 99 mg/dL    Comment: Glucose reference range applies only to samples taken after fasting for at least 8 hours.   BUN 12 8 - 23 mg/dL   Creatinine, Ser 8.11 0.61 - 1.24 mg/dL   Calcium  9.5 8.9 - 10.3 mg/dL   GFR, Estimated >91 >47 mL/min    Comment: (NOTE) Calculated using the CKD-EPI Creatinine Equation (2021)    Anion gap 12 5 - 15    Comment: Performed at [facility], [address] [place], [place] [address]  CBC per protocol     Status: Abnormal   Collection Time: 01/25/24  9:50 AM  Result Value Ref Range   WBC 13.7 (H) 4.0 - 10.5 K/uL   RBC 5.78 4.22 - 5.81 MIL/uL   Hemoglobin 15.7 13.0 - 17.0 g/dL   HCT 21.3 08.6 - 57.8 %   MCV 84.3 80.0 - 100.0 fL   MCH 27.2 26.0 - 34.0 pg   MCHC 32.2 30.0 - 36.0 g/dL   RDW 46.9 62.9 - 52.8 %   Platelets 349 150 - 400 K/uL   nRBC 0.0 0.0 - 0.2 %    Comment: Performed at [facility], [address] [place], [place] [address]   No results found.  Review of Systems  Blood pressure 137/71, pulse 67, temperature 98.3 F (36.8 C), temperature source Oral, resp. rate 18, height 5'  11.5" (1.816 m),  weight 70.8 kg, SpO2 98%. Physical Exam  We will plan for carpal and cubital tunnel release right upper extremity.  Patient has an inflammatory workup pending through rheumatology.  Nerve studies are highly abnormal and we will proceed accordingly with nerve releases today.  I discussed this with his family physician/internist Dr. Mee Spillers.  The patient is alert and oriented in no acute distress. The patient complains of pain in the affected upper extremity.  The patient is noted to have a normal HEENT exam. Lung fields show equal chest expansion and no shortness of breath. Abdomen exam is nontender without distention. Lower extremity examination does not show any fracture dislocation or blood clot symptoms. Pelvis is stable and the neck and back are stable and nontender.  Assessment/Plan Plan right ulnar nerve release at the elbow in situ and right carpal tunnel release of the wrist secondary to chronic compression.  We are planning surgery for your upper extremity. The risk and benefits of surgery to include risk of bleeding, infection, anesthesia,  damage to normal structures and failure of the surgery to accomplish its intended goals of relieving symptoms and restoring function have been discussed in detail. With this in mind we plan to proceed. I have specifically discussed with the patient the pre-and postoperative regime and the dos and don'ts and risk and benefits in great detail. Risk and benefits of surgery also include risk of dystrophy(CRPS), chronic nerve pain, failure of the healing process to go onto completion and other inherent risks of surgery The relavent the pathophysiology of the disease/injury process, as well as the alternatives for treatment and postoperative course of action has been discussed in great detail with the patient who desires to proceed.  We will do everything in our power to help you (the patient) restore function to the upper extremity. It is a pleasure to  see this patient today.   Isom Marin III, MD 01/25/2024, 10:43 AM

## 2024-01-26 ENCOUNTER — Encounter (HOSPITAL_COMMUNITY): Payer: Self-pay | Admitting: Orthopedic Surgery

## 2024-01-31 IMAGING — CT CT HEAD W/O CM
4 series · 16 of 47 positions shown, 18 images · non-contrast
Comparison: 02/03/2020

CLINICAL DATA: Tunnel vision, memory loss, dizziness for 3 days



[Series 2: head 5.00 hr40 s3 axial ibhc · axial · 0.45mm/px · z∈[-611,-486]mm · 7 of 35 slices shown, 9 images]
[im 5/35  brain]
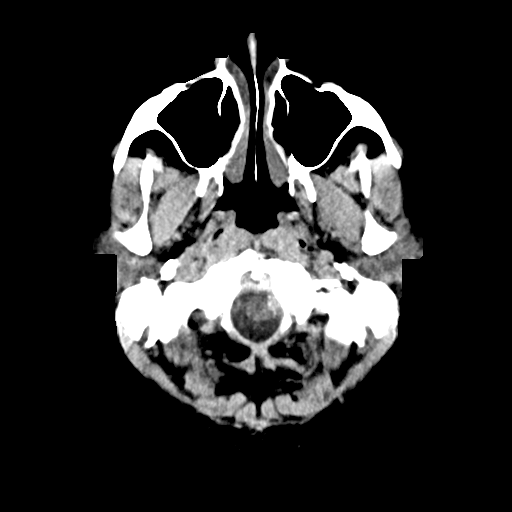
[im 5/35  bone]
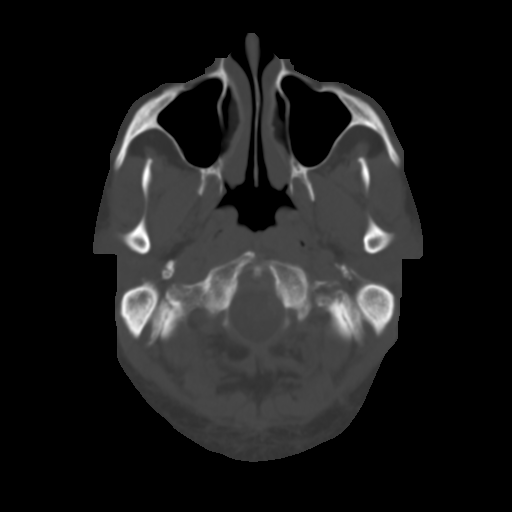
[im 9/35  brain]
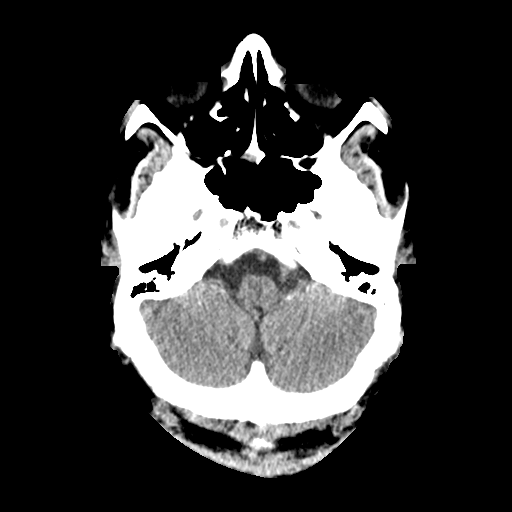
[im 13/35  brain]
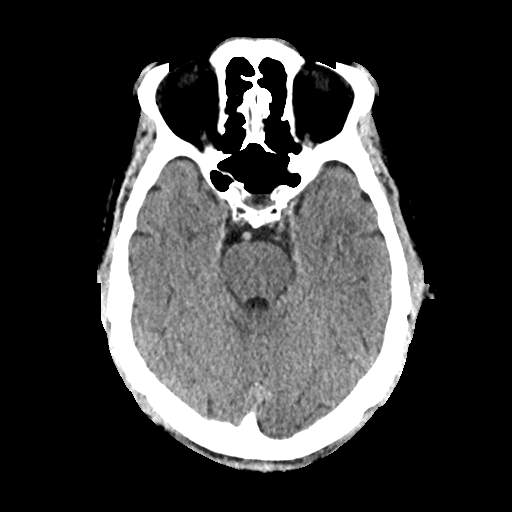
[im 18/35  brain]
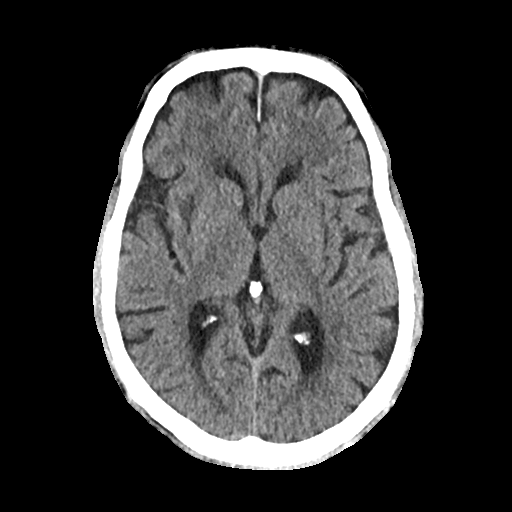
[im 22/35  brain]
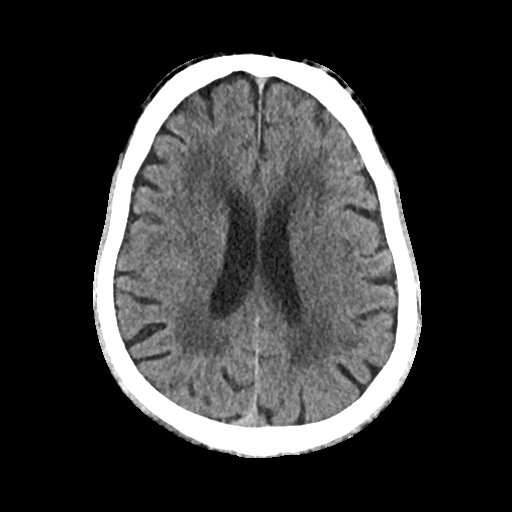
[im 22/35  bone]
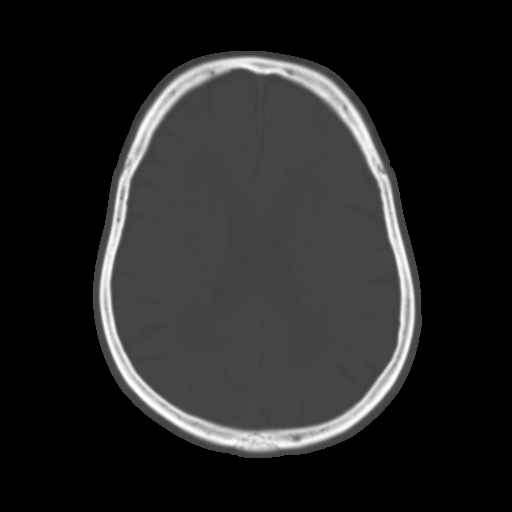
[im 26/35  brain]
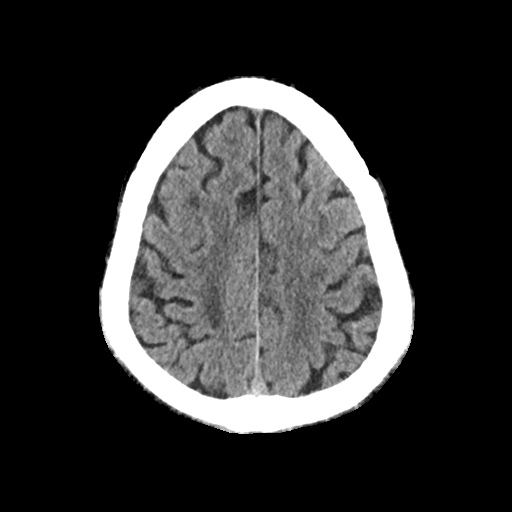
[im 30/35  brain]
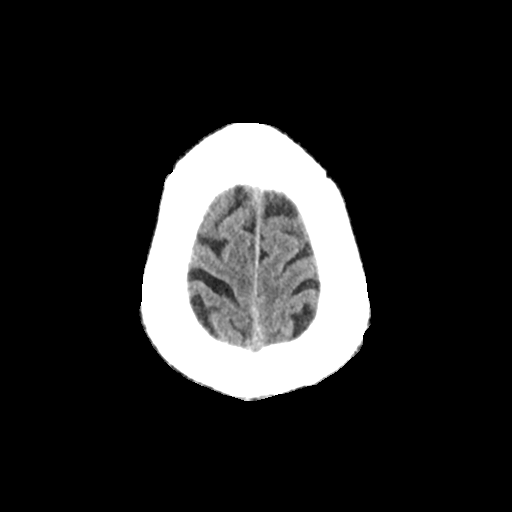

[Series 3: head 2.00 hr60 s3 axial bone · axial · 0.45mm/px · z∈[-617,-581]mm · 3 of 88 slices shown]
[im 9/88  bone]
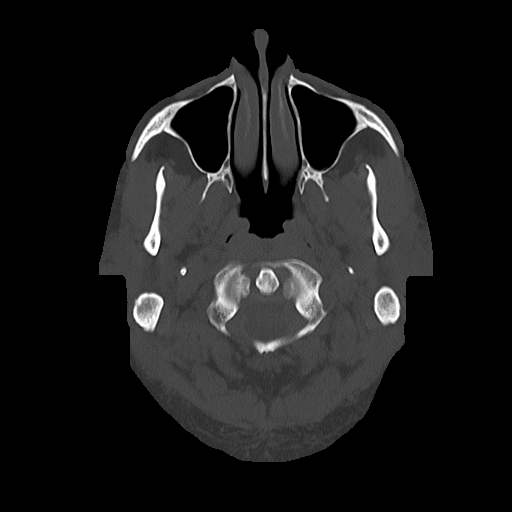
[im 18/88  bone]
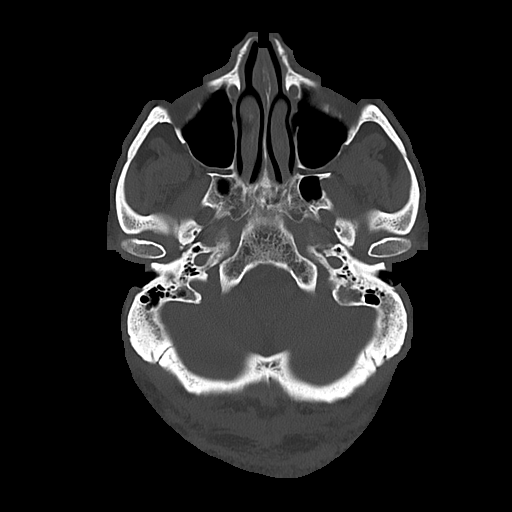
[im 27/88  bone]
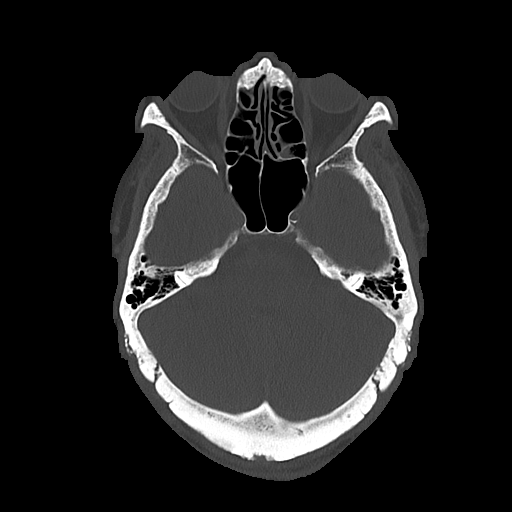

[Series 4: head 3.00 hr40 s3 sag · sagittal · 0.34mm/px · 3 of 59 slices shown]
[im 20/59  brain]
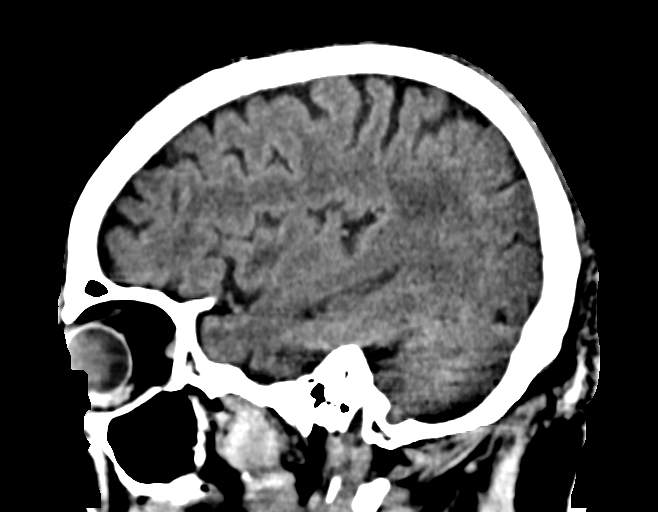
[im 30/59  brain]
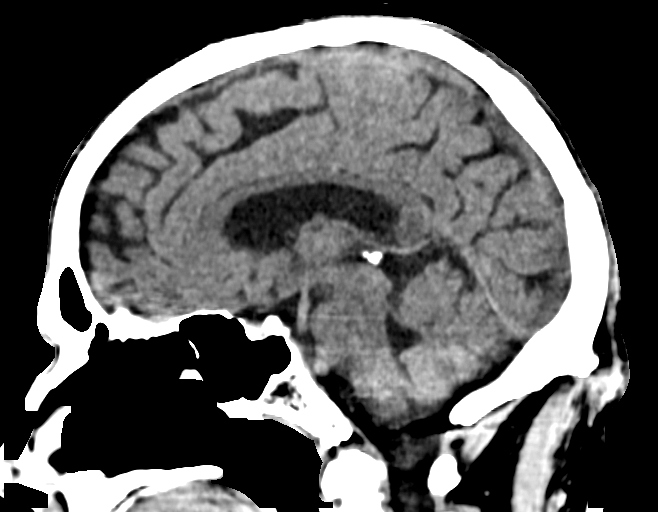
[im 39/59  brain]
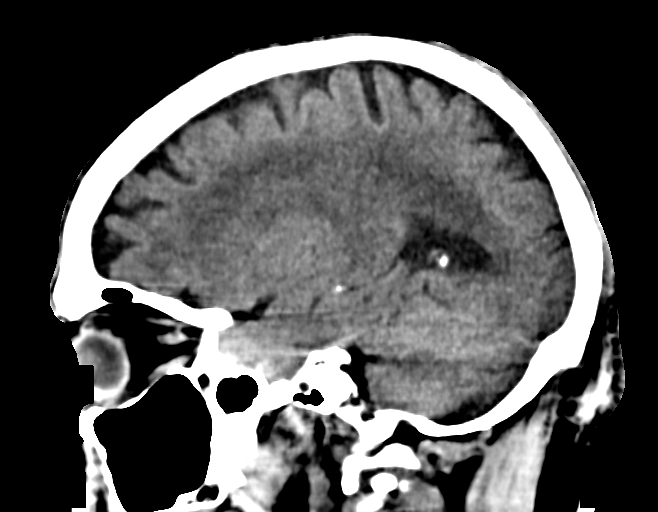

[Series 6: head 3.00 hr40 s3 cor · coronal · 0.34mm/px · 3 of 73 slices shown]
[im 25/73  brain]
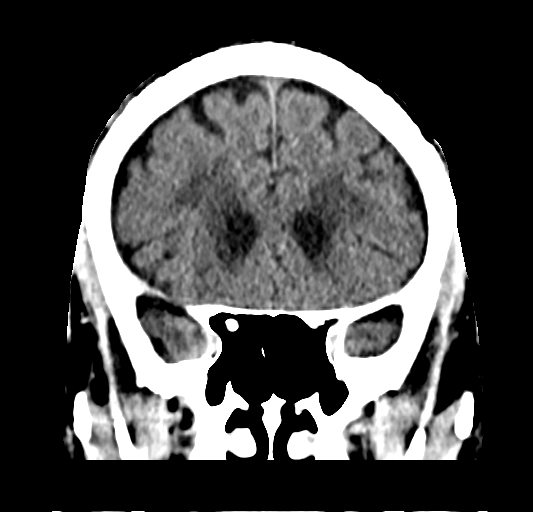
[im 33/73  brain]
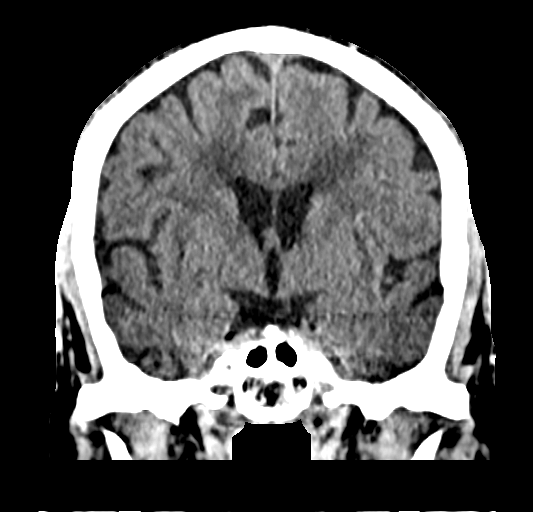
[im 41/73  brain]
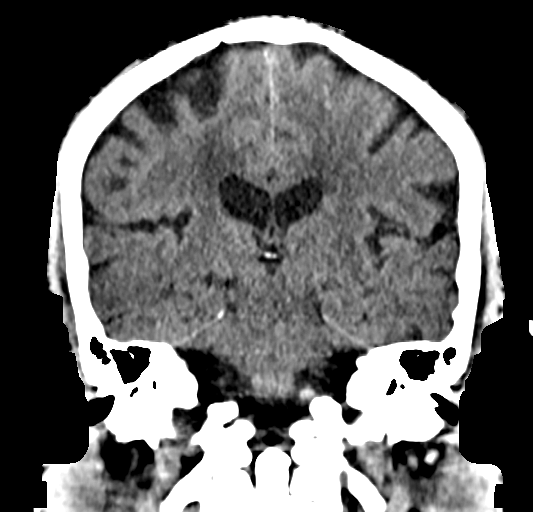

[16 of 47 positions shown; findings below may reference images not displayed]

FINDINGS: Brain: No evidence of acute infarction, hemorrhage, extra-axial
collection, ventriculomegaly, or mass effect. Generalized cerebral
atrophy. Periventricular white matter low attenuation likely
secondary to microangiopathy.

Vascular: Cerebrovascular atherosclerotic calcifications are noted.

Skull: Negative for fracture or focal lesion.

Sinuses/Orbits: Visualized portions of the orbits are unremarkable.
Visualized portions of the paranasal sinuses are unremarkable.
Visualized portions of the mastoid air cells are unremarkable.

Other: None.
IMPRESSION: 1. No acute intracranial abnormality.
2. Chronic microangiopathy and cerebral atrophy.

## 2024-02-12 ENCOUNTER — Other Ambulatory Visit: Payer: Self-pay | Admitting: Cardiovascular Disease

## 2024-03-15 ENCOUNTER — Encounter (HOSPITAL_COMMUNITY): Payer: Self-pay

## 2024-03-15 ENCOUNTER — Other Ambulatory Visit: Payer: Self-pay

## 2024-03-15 ENCOUNTER — Emergency Department (HOSPITAL_COMMUNITY)
Admission: EM | Admit: 2024-03-15 | Discharge: 2024-03-16 | Discharge status: Against Medical Advice | Attending: Emergency Medicine | Admitting: Emergency Medicine

## 2024-03-15 DIAGNOSIS — I10 Essential (primary) hypertension: Secondary | ICD-10-CM | POA: Diagnosis not present

## 2024-03-15 DIAGNOSIS — I2510 Atherosclerotic heart disease of native coronary artery without angina pectoris: Secondary | ICD-10-CM | POA: Diagnosis not present

## 2024-03-15 DIAGNOSIS — Z79899 Other long term (current) drug therapy: Secondary | ICD-10-CM | POA: Diagnosis not present

## 2024-03-15 DIAGNOSIS — Z8546 Personal history of malignant neoplasm of prostate: Secondary | ICD-10-CM | POA: Diagnosis not present

## 2024-03-15 DIAGNOSIS — Z5321 Procedure and treatment not carried out due to patient leaving prior to being seen by health care provider: Secondary | ICD-10-CM | POA: Diagnosis not present

## 2024-03-15 DIAGNOSIS — Z7982 Long term (current) use of aspirin: Secondary | ICD-10-CM | POA: Diagnosis not present

## 2024-03-15 DIAGNOSIS — R739 Hyperglycemia, unspecified: Secondary | ICD-10-CM | POA: Diagnosis present

## 2024-03-15 DIAGNOSIS — E1165 Type 2 diabetes mellitus with hyperglycemia: Secondary | ICD-10-CM | POA: Insufficient documentation

## 2024-03-15 LAB — COMPREHENSIVE METABOLIC PANEL WITH GFR
ALT: 29 U/L (ref 0–44)
AST: 18 U/L (ref 15–41)
Albumin: 3.4 g/dL — ABNORMAL LOW (ref 3.5–5.0)
Alkaline Phosphatase: 101 U/L (ref 38–126)
Anion gap: 10 (ref 5–15)
BUN: 16 mg/dL (ref 8–23)
CO2: 23 mmol/L (ref 22–32)
Calcium: 9 mg/dL (ref 8.9–10.3)
Chloride: 98 mmol/L (ref 98–111)
Creatinine, Ser: 0.95 mg/dL (ref 0.61–1.24)
GFR, Estimated: >60 mL/min (ref >60)
Glucose, Bld: 466 mg/dL — ABNORMAL HIGH (ref 70–99)
Potassium: 4.3 mmol/L (ref 3.5–5.1)
Sodium: 131 mmol/L — ABNORMAL LOW (ref 135–145)
Total Bilirubin: 0.7 mg/dL (ref 0.0–1.2)
Total Protein: 5.9 g/dL — ABNORMAL LOW (ref 6.5–8.1)

## 2024-03-15 LAB — I-STAT VENOUS BLOOD GAS, ED
Acid-Base Excess: 1 mmol/L (ref 0.0–2.0)
Bicarbonate: 23.7 mmol/L (ref 20.0–28.0)
Calcium, Ion: 1.16 mmol/L (ref 1.15–1.40)
HCT: 39 % (ref 39.0–52.0)
Hemoglobin: 13.3 g/dL (ref 13.0–17.0)
O2 Saturation: 91 %
Potassium: 4.4 mmol/L (ref 3.5–5.1)
Sodium: 131 mmol/L — ABNORMAL LOW (ref 135–145)
TCO2: 25 mmol/L (ref 22–32)
pCO2, Ven: 31.9 mmHg — ABNORMAL LOW (ref 44–60)
pH, Ven: 7.479 — ABNORMAL HIGH (ref 7.25–7.43)
pO2, Ven: 56 mmHg — ABNORMAL HIGH (ref 32–45)

## 2024-03-15 LAB — BETA-HYDROXYBUTYRIC ACID: Beta-Hydroxybutyric Acid: 0.18 mmol/L (ref 0.05–0.27)

## 2024-03-15 LAB — CBC WITH DIFFERENTIAL/PLATELET
Abs Immature Granulocytes: 0.03 K/uL (ref 0.00–0.07)
Basophils Absolute: 0 K/uL (ref 0.0–0.1)
Basophils Relative: 0 %
Eosinophils Absolute: 0 K/uL (ref 0.0–0.5)
Eosinophils Relative: 0 %
HCT: 39.5 % (ref 39.0–52.0)
Hemoglobin: 12.8 g/dL — ABNORMAL LOW (ref 13.0–17.0)
Immature Granulocytes: 0 %
Lymphocytes Relative: 14 %
Lymphs Abs: 1.3 K/uL (ref 0.7–4.0)
MCH: 26.8 pg (ref 26.0–34.0)
MCHC: 32.4 g/dL (ref 30.0–36.0)
MCV: 82.6 fL (ref 80.0–100.0)
Monocytes Absolute: 0.5 K/uL (ref 0.1–1.0)
Monocytes Relative: 6 %
Neutro Abs: 6.9 K/uL (ref 1.7–7.7)
Neutrophils Relative %: 80 %
Platelets: 188 K/uL (ref 150–400)
RBC: 4.78 MIL/uL (ref 4.22–5.81)
RDW: 16.7 % — ABNORMAL HIGH (ref 11.5–15.5)
WBC: 8.7 K/uL (ref 4.0–10.5)
nRBC: 0 % (ref 0.0–0.2)

## 2024-03-15 LAB — URINALYSIS, ROUTINE W REFLEX MICROSCOPIC
Bacteria, UA: NONE SEEN
Bilirubin Urine: NEGATIVE
Glucose, UA: >=500 mg/dL — AB
Hgb urine dipstick: NEGATIVE
Ketones, ur: NEGATIVE mg/dL
Leukocytes,Ua: NEGATIVE
Nitrite: NEGATIVE
Protein, ur: NEGATIVE mg/dL
Specific Gravity, Urine: 1.029 (ref 1.005–1.030)
pH: 7 (ref 5.0–8.0)

## 2024-03-15 LAB — CBG MONITORING, ED: Glucose-Capillary: 476 mg/dL — ABNORMAL HIGH (ref 70–99)

## 2024-03-15 NOTE — ED Triage Notes (Signed)
 Pt to ED c/o hyperglycemia. Reports cbg 525 at home, Reports DM and complaint with medications. Reports currently on steroid/prednisone.

## 2024-03-15 NOTE — ED Provider Triage Note (Signed)
 Emergency Medicine Provider Triage Evaluation Note  Seth Guzman , a 78 y.o. male  was evaluated in triage.  Pt complains of elevated blood sugar for the past 3 days.  Reports been in the 400s.  Takes p.o. pills for diabetes medication is not on any insulin .  Has been on prednisone for the past 2 weeks for his polymyalgia rheumatica.  He says to be on it until August when he sees rheumatologist again.  Denies any recent cough or cold symptoms.  Review of Systems  Positive:  Negative:   Physical Exam  BP 134/74 (BP Location: Right Arm)   Pulse 71   Temp 98 F (36.7 C)   Resp 20   Ht 5' 11 (1.803 m)   Wt 66.2 kg   SpO2 99%   BMI 20.36 kg/m  Gen:   Awake, no distress   Resp:  Normal effort  MSK:   Moves extremities without difficulty  Other:  Speaking in full sentences, does not appear in any acute distress.  Hemodynamically stable  Medical Decision Making  Medically screening exam initiated at 8:49 PM.  Appropriate orders placed.  [name] Mania was informed that the remainder of the evaluation will be completed by another provider, this initial triage assessment does not replace that evaluation, and the importance of remaining in the ED until their evaluation is complete.  Urinalysis ordered in triage, does show glucose however no ketones.  Have ordered rest of hyperglycemia workup.   [name], PA-C 03/15/24 2050

## 2024-03-16 ENCOUNTER — Emergency Department (HOSPITAL_BASED_OUTPATIENT_CLINIC_OR_DEPARTMENT_OTHER)
Admission: EM | Admit: 2024-03-16 | Discharge: 2024-03-16 | Disposition: A | Discharge status: Home / Self Care | Source: Home / Self Care | Attending: Emergency Medicine | Admitting: Emergency Medicine

## 2024-03-16 ENCOUNTER — Other Ambulatory Visit: Payer: Self-pay

## 2024-03-16 DIAGNOSIS — Z7982 Long term (current) use of aspirin: Secondary | ICD-10-CM | POA: Insufficient documentation

## 2024-03-16 DIAGNOSIS — E1165 Type 2 diabetes mellitus with hyperglycemia: Secondary | ICD-10-CM | POA: Insufficient documentation

## 2024-03-16 DIAGNOSIS — R739 Hyperglycemia, unspecified: Secondary | ICD-10-CM

## 2024-03-16 DIAGNOSIS — I2510 Atherosclerotic heart disease of native coronary artery without angina pectoris: Secondary | ICD-10-CM | POA: Insufficient documentation

## 2024-03-16 DIAGNOSIS — Z8546 Personal history of malignant neoplasm of prostate: Secondary | ICD-10-CM | POA: Insufficient documentation

## 2024-03-16 DIAGNOSIS — Z79899 Other long term (current) drug therapy: Secondary | ICD-10-CM | POA: Insufficient documentation

## 2024-03-16 DIAGNOSIS — I10 Essential (primary) hypertension: Secondary | ICD-10-CM | POA: Insufficient documentation

## 2024-03-16 LAB — CBG MONITORING, ED: Glucose-Capillary: 298 mg/dL — ABNORMAL HIGH (ref 70–99)

## 2024-03-16 NOTE — ED Provider Notes (Signed)
  EMERGENCY DEPARTMENT AT [facility] Provider Note  CSN: [id number] Arrival date & time: 03/16/24 0304  Chief Complaint(s) Hyperglycemia  HPI Seth Guzman is a 78 y.o. male with a past medical history listed below including type 2 diabetes here for elevated blood sugar levels up in the 500s.  Patient is currently being treated for polymyalgia rheumatica with steroid taper started in June.  Admitted to having high carb meals recently.  Patient initially went to [facility] for evaluation and had labs drawn which did reveal hyperglycemia without DKA.  UA did not show evidence of infection.   The history is provided by the patient.    Past Medical History Past Medical History:  Diagnosis Date   Anxiety    Arthritis    CAD in native artery    a. cath 2006 - mod CAD, med rx. b. cath 2016 -  20% prox-mid RCA, 30% ost-prox LAD, 30% mLAd, 99% distal LAD-2, 90% distal LAD-1, 50% D2, 70% D3 -> the LAD disease was small caliber/apical and too small for PCI. EF was 45-50%   Complication of anesthesia    slow to wake up after hernia surgery at the surgical center   Depression    Diabetes mellitus type 2 in nonobese [facility])    GERD (gastroesophageal reflux disease)    Gout    Heart murmur    HTN (hypertension)    Hx of radiation therapy 01/08/14- 03/01/14   prostate 7600 cGy 38 sessions   Hyperlipidemia    Inguinal hernia    recurrent right inguinal hernia   Ischemic cardiomyopathy    a. EF 45-50% by cath 2016.   Meatal stenosis 1966, 1967   Pneumonia    x 1   Prostate cancer (HCC) 08/10/2013   Gleason 6, volume 28.67 cc   Seizures (HCC)    x 1 - yrs ago per patient   Patient Active Problem List   Diagnosis Date Noted   Precordial pain    Heart murmur    GERD (gastroesophageal reflux disease)    Prostate cancer (HCC)    HYPERTENSION, BENIGN 03/21/2009   CORONARY ATHEROSCLEROSIS, NATIVE VESSEL 03/21/2009   PURE HYPERCHOLESTEROLEMIA 03/18/2009   Home  Medication(s) Prior to Admission medications   Medication Sig Start Date End Date Taking? Authorizing Provider  amLODipine (NORVASC) 5 MG tablet Take 5 mg by mouth daily.    [provider]  aspirin  EC 81 MG tablet Take 81 mg by mouth daily.    [provider]  carvedilol  (COREG ) 6.25 MG tablet Take 6.25 mg by mouth 2 (two) times daily with a meal.     [provider]  Coenzyme Q10 200 MG capsule Take 200 mg by mouth daily.    [provider]  Empagliflozin-metFORMIN HCl (SYNJARDY) 12.12-998 MG TABS Take 1 tablet by mouth daily.    [provider]  escitalopram (LEXAPRO) 10 MG tablet Take 10 mg by mouth daily.    [provider]  fish oil-omega-3 fatty acids 1000 MG capsule Take 1 g by mouth daily.     [provider]  gabapentin (NEURONTIN) 300 MG capsule Take 300 mg by mouth 3 (three) times daily.    [provider]  glimepiride (AMARYL) 4 MG tablet Take 4 mg by mouth 2 (two) times daily.     [provider]  glucose blood (ONETOUCH VERIO) test strip check blood sugars 2 times  a day Dx E11.51 In Vitro 12/16/18   [provider]  isosorbide  mononitrate (IMDUR ) 60 MG 24 hr tablet TAKE 1/2 TABLET BY MOUTH DAILY 02/11/23   Walker, Caitlin S, NP  Multiple Vitamins-Minerals (MULTIVITAMIN WITH MINERALS) tablet Take 1 tablet by mouth daily.    [provider]  naproxen sodium (ANAPROX) 220 MG tablet Take 440 mg by mouth 2 (two) times daily as needed (back pain).    [provider]  nitroGLYCERIN  (NITROSTAT ) 0.4 MG SL tablet Place 1 tablet (0.4 mg total) under the tongue every 5 (five) minutes as needed for chest pain. 10/03/14   Verlin Lonni BIRCH, MD  oxyCODONE  (OXY IR/ROXICODONE ) 5 MG immediate release tablet Take 5 mg by mouth every 6 (six) hours as needed for severe pain (pain score 7-10).    [provider]  rosuvastatin  (CRESTOR ) 20 MG tablet TAKE 1 TABLET(20 MG) BY MOUTH DAILY  02/15/24   Verlin Lonni BIRCH, MD                                                                                                                                    Allergies Vicodin [hydrocodone-acetaminophen ]  Review of Systems Review of Systems As noted in HPI  Physical Exam Vital Signs  I have reviewed the triage vital signs BP (!) 158/74   Pulse 61   Temp 97.9 F (36.6 C) (Oral)   Resp 20   Ht 5' 11 (1.803 m)   Wt 66.2 kg   SpO2 99%   BMI 20.36 kg/m   Physical Exam Vitals reviewed.  Constitutional:      General: He is not in acute distress.    Appearance: He is well-developed. He is not diaphoretic.  HENT:     Head: Normocephalic and atraumatic.     Right Ear: External ear normal.     Left Ear: External ear normal.     Nose: Nose normal.     Mouth/Throat:     Mouth: Mucous membranes are moist.  Eyes:     General: No scleral icterus.    Conjunctiva/sclera: Conjunctivae normal.  Neck:     Trachea: Phonation normal.  Cardiovascular:     Rate and Rhythm: Normal rate and regular rhythm.  Pulmonary:     Effort: Pulmonary effort is normal. No respiratory distress.     Breath sounds: No stridor.  Abdominal:     General: There is no distension.  Musculoskeletal:        General: Normal range of motion.     Cervical back: Normal range of motion.  Neurological:     Mental Status: He is alert and oriented to person, place, and time.  Psychiatric:        Behavior: Behavior normal.     ED Results and Treatments Labs (all labs ordered are listed, but only abnormal results are displayed) Labs Reviewed  CBG MONITORING, ED - Abnormal; Notable for the following components:      Result Value   Glucose-Capillary  298 (*)    All other components within normal limits                                                                                                                         EKG  EKG Interpretation Date/Time:    Ventricular Rate:    PR Interval:     QRS Duration:    QT Interval:    QTC Calculation:   R Axis:      Text Interpretation:         Radiology No results found.  Medications Ordered in ED Medications - No data to display Procedures Procedures  (including critical care time) Medical Decision Making / ED Course   Medical Decision Making Amount and/or Complexity of Data Reviewed Labs: ordered. Decision-making details documented in ED Course.    Hyperglycemia in the setting of dietary noncompliance and steroid use.  CBG here is below 300.  Labs for both school reassuring without DKA.  No need for intervention.  Discussed need for close follow-up with PCP/rheumatologist to discuss steroid regimen.  Dietary changes recommended.  Also recommended discussing need for continuous glucose monitoring.    Final Clinical Impression(s) / ED Diagnoses Final diagnoses:  Hyperglycemia   The patient appears reasonably screened and/or stabilized for discharge and I doubt any other medical condition or other [facility] requiring further screening, evaluation, or treatment in the ED at this time. I have discussed the findings, Dx and Tx plan with the patient/family who expressed understanding and agree(s) with the plan. Discharge instructions discussed at length. The patient/family was given strict return precautions who verbalized understanding of the instructions. No further questions at time of discharge.  Disposition: Discharge  Condition: Good  ED Discharge Orders     None       Follow Up: Nichole Senior, MD [address] [place] [address] [phone]  Call  to schedule an appointment for close follow up  Mai Lynwood FALCON, MD [address] [place] [address] [phone]  Call      This chart was dictated using voice recognition software.  Despite best efforts to proofread,  errors can occur which can change the documentation meaning.    Trine Raynell Moder, MD 03/16/24 [phone]

## 2024-03-16 NOTE — ED Triage Notes (Signed)
 Pt POV with Family d/t Elevated blood sugar.  Pt states it was 525 mg/dL at home.  He waited over 8 hours in ER at [facility] and not seen.  Pt is taking Prednisone for an all over body infection for 3 weeks - prescribed by Dr Mai (Rheumatologist).

## 2024-03-16 NOTE — ED Notes (Signed)
 Pt and family stated that they were leaving.

## 2024-03-19 ENCOUNTER — Encounter (HOSPITAL_COMMUNITY): Payer: Self-pay | Admitting: Emergency Medicine

## 2024-03-19 ENCOUNTER — Emergency Department (HOSPITAL_COMMUNITY)

## 2024-03-19 ENCOUNTER — Observation Stay (HOSPITAL_COMMUNITY)
Admission: EM | Admit: 2024-03-19 | Discharge: 2024-03-21 | Disposition: A | Discharge status: Home / Self Care | Attending: Internal Medicine | Admitting: Internal Medicine

## 2024-03-19 ENCOUNTER — Other Ambulatory Visit: Payer: Self-pay

## 2024-03-19 DIAGNOSIS — K219 Gastro-esophageal reflux disease without esophagitis: Secondary | ICD-10-CM | POA: Diagnosis not present

## 2024-03-19 DIAGNOSIS — D509 Iron deficiency anemia, unspecified: Secondary | ICD-10-CM | POA: Diagnosis not present

## 2024-03-19 DIAGNOSIS — D649 Anemia, unspecified: Secondary | ICD-10-CM

## 2024-03-19 DIAGNOSIS — E876 Hypokalemia: Secondary | ICD-10-CM | POA: Diagnosis not present

## 2024-03-19 DIAGNOSIS — R55 Syncope and collapse: Secondary | ICD-10-CM | POA: Diagnosis not present

## 2024-03-19 DIAGNOSIS — Z7901 Long term (current) use of anticoagulants: Secondary | ICD-10-CM | POA: Insufficient documentation

## 2024-03-19 DIAGNOSIS — I2510 Atherosclerotic heart disease of native coronary artery without angina pectoris: Secondary | ICD-10-CM | POA: Diagnosis not present

## 2024-03-19 DIAGNOSIS — E785 Hyperlipidemia, unspecified: Secondary | ICD-10-CM | POA: Insufficient documentation

## 2024-03-19 DIAGNOSIS — E1169 Type 2 diabetes mellitus with other specified complication: Secondary | ICD-10-CM

## 2024-03-19 DIAGNOSIS — F419 Anxiety disorder, unspecified: Secondary | ICD-10-CM | POA: Insufficient documentation

## 2024-03-19 DIAGNOSIS — R404 Transient alteration of awareness: Principal | ICD-10-CM

## 2024-03-19 DIAGNOSIS — F32A Depression, unspecified: Secondary | ICD-10-CM | POA: Insufficient documentation

## 2024-03-19 DIAGNOSIS — F1090 Alcohol use, unspecified, uncomplicated: Secondary | ICD-10-CM | POA: Diagnosis not present

## 2024-03-19 DIAGNOSIS — M6281 Muscle weakness (generalized): Secondary | ICD-10-CM | POA: Diagnosis not present

## 2024-03-19 DIAGNOSIS — I10 Essential (primary) hypertension: Secondary | ICD-10-CM | POA: Diagnosis present

## 2024-03-19 DIAGNOSIS — E119 Type 2 diabetes mellitus without complications: Secondary | ICD-10-CM | POA: Diagnosis not present

## 2024-03-19 DIAGNOSIS — M109 Gout, unspecified: Secondary | ICD-10-CM | POA: Diagnosis not present

## 2024-03-19 DIAGNOSIS — I5022 Chronic systolic (congestive) heart failure: Secondary | ICD-10-CM | POA: Insufficient documentation

## 2024-03-19 DIAGNOSIS — Z794 Long term (current) use of insulin: Secondary | ICD-10-CM | POA: Diagnosis not present

## 2024-03-19 DIAGNOSIS — I110 Hypertensive heart disease with heart failure: Secondary | ICD-10-CM | POA: Insufficient documentation

## 2024-03-19 DIAGNOSIS — R4182 Altered mental status, unspecified: Secondary | ICD-10-CM | POA: Diagnosis present

## 2024-03-19 LAB — CBC WITH DIFFERENTIAL/PLATELET
Abs Immature Granulocytes: 0.05 K/uL (ref 0.00–0.07)
Basophils Absolute: 0 K/uL (ref 0.0–0.1)
Basophils Relative: 0 %
Eosinophils Absolute: 0 K/uL (ref 0.0–0.5)
Eosinophils Relative: 0 %
HCT: 36.9 % — ABNORMAL LOW (ref 39.0–52.0)
Hemoglobin: 12.2 g/dL — ABNORMAL LOW (ref 13.0–17.0)
Immature Granulocytes: 1 %
Lymphocytes Relative: 20 %
Lymphs Abs: 2.2 K/uL (ref 0.7–4.0)
MCH: 27.5 pg (ref 26.0–34.0)
MCHC: 33.1 g/dL (ref 30.0–36.0)
MCV: 83.3 fL (ref 80.0–100.0)
Monocytes Absolute: 1 K/uL (ref 0.1–1.0)
Monocytes Relative: 9 %
Neutro Abs: 7.9 K/uL — ABNORMAL HIGH (ref 1.7–7.7)
Neutrophils Relative %: 70 %
Platelets: 178 K/uL (ref 150–400)
RBC: 4.43 MIL/uL (ref 4.22–5.81)
RDW: 16.9 % — ABNORMAL HIGH (ref 11.5–15.5)
WBC: 11.1 K/uL — ABNORMAL HIGH (ref 4.0–10.5)
nRBC: 0 % (ref 0.0–0.2)

## 2024-03-19 LAB — COMPREHENSIVE METABOLIC PANEL WITH GFR
ALT: 18 U/L (ref 0–44)
AST: 18 U/L (ref 15–41)
Albumin: 2.9 g/dL — ABNORMAL LOW (ref 3.5–5.0)
Alkaline Phosphatase: 59 U/L (ref 38–126)
Anion gap: 11 (ref 5–15)
BUN: 11 mg/dL (ref 8–23)
CO2: 21 mmol/L — ABNORMAL LOW (ref 22–32)
Calcium: 8.8 mg/dL — ABNORMAL LOW (ref 8.9–10.3)
Chloride: 105 mmol/L (ref 98–111)
Creatinine, Ser: 0.95 mg/dL (ref 0.61–1.24)
GFR, Estimated: >60 mL/min (ref >60)
Glucose, Bld: 159 mg/dL — ABNORMAL HIGH (ref 70–99)
Potassium: 3.4 mmol/L — ABNORMAL LOW (ref 3.5–5.1)
Sodium: 137 mmol/L (ref 135–145)
Total Bilirubin: 0.6 mg/dL (ref 0.0–1.2)
Total Protein: 5.1 g/dL — ABNORMAL LOW (ref 6.5–8.1)

## 2024-03-19 LAB — RAPID URINE DRUG SCREEN, HOSP PERFORMED
Amphetamines: NOT DETECTED
Barbiturates: NOT DETECTED
Benzodiazepines: NOT DETECTED
Cocaine: NOT DETECTED
Opiates: NOT DETECTED
Tetrahydrocannabinol: NOT DETECTED

## 2024-03-19 LAB — URINALYSIS, ROUTINE W REFLEX MICROSCOPIC
Bacteria, UA: NONE SEEN
Bilirubin Urine: NEGATIVE
Glucose, UA: >=500 mg/dL — AB
Hgb urine dipstick: NEGATIVE
Ketones, ur: 5 mg/dL — AB
Leukocytes,Ua: NEGATIVE
Nitrite: NEGATIVE
Protein, ur: NEGATIVE mg/dL
Specific Gravity, Urine: 1.024 (ref 1.005–1.030)
pH: 7 (ref 5.0–8.0)

## 2024-03-19 LAB — IRON AND TIBC
Iron: 34 ug/dL — ABNORMAL LOW (ref 45–182)
Saturation Ratios: 13 % — ABNORMAL LOW (ref 17.9–39.5)
TIBC: 260 ug/dL (ref 250–450)
UIBC: 226 ug/dL

## 2024-03-19 LAB — TROPONIN I (HIGH SENSITIVITY)
Troponin I (High Sensitivity): 12 ng/L (ref <18)
Troponin I (High Sensitivity): 11 ng/L (ref <18)

## 2024-03-19 LAB — MAGNESIUM: Magnesium: 1.7 mg/dL (ref 1.7–2.4)

## 2024-03-19 LAB — ETHANOL: Alcohol, Ethyl (B): <15 mg/dL (ref <15)

## 2024-03-19 LAB — CBG MONITORING, ED
Glucose-Capillary: 149 mg/dL — ABNORMAL HIGH (ref 70–99)
Glucose-Capillary: 158 mg/dL — ABNORMAL HIGH (ref 70–99)

## 2024-03-19 LAB — HEMOGLOBIN A1C
Hgb A1c MFr Bld: 10.5 % — ABNORMAL HIGH (ref 4.8–5.6)
Mean Plasma Glucose: 254.65 mg/dL

## 2024-03-19 LAB — FERRITIN: Ferritin: 216 ng/mL (ref 24–336)

## 2024-03-19 LAB — GLUCOSE, CAPILLARY: Glucose-Capillary: 149 mg/dL — ABNORMAL HIGH (ref 70–99)

## 2024-03-19 MED ORDER — INSULIN ASPART 100 UNIT/ML IJ SOLN: 0.0000 [IU] | Freq: Every day | INTRAMUSCULAR | Status: DC

## 2024-03-19 MED ORDER — SODIUM CHLORIDE 0.9 % IV BOLUS
1000.0000 mL | Freq: Once | INTRAVENOUS | Status: AC
  Administered 2024-03-19: 1000 mL via INTRAVENOUS

## 2024-03-19 MED ORDER — INSULIN ASPART 100 UNIT/ML IJ SOLN
0.0000 [IU] | Freq: Three times a day (TID) | INTRAMUSCULAR | Status: DC
  Administered 2024-03-20 – 2024-03-21 (×3): 3 [IU] via SUBCUTANEOUS
  Administered 2024-03-21: 2 [IU] via SUBCUTANEOUS

## 2024-03-19 MED ORDER — POTASSIUM CHLORIDE CRYS ER 20 MEQ PO TBCR
40.0000 meq | EXTENDED_RELEASE_TABLET | Freq: Once | ORAL | Status: AC
  Administered 2024-03-19: 40 meq via ORAL
  Filled 2024-03-19: qty 2

## 2024-03-19 NOTE — ED Triage Notes (Signed)
 Patient via EMS-he and wife went to [employer] for lunch and he was so dizzy and weak he could not get out of the car, though was able to exit the car with EMS assistance. EMS noted him to be altered. CBG 107 which family states was low for him, so they gave some D10 d/t his taking insulin  without eating this morning. EMS also gave narcan as he has a prescription for oxy. On arrival to ED, patient aphasic. Cannot identify a phone or state the use of a spoon. LKW 1130. EDP at bedside.

## 2024-03-19 NOTE — ED Provider Notes (Signed)
 [place] EMERGENCY DEPARTMENT AT [facility] Provider Note   CSN: [id number] Arrival date & time: 03/19/24  1249     Patient presents with: Altered Mental Status   Seth Guzman is a 78 y.o. male.  He was brought in by EMS for acute mental status change.  He has a history of prostate cancer, polymyalgia rheumatica, seizure.  Recently added insulin  due to elevated blood sugars while on steroids.  Was going with his wife to get breakfast today last known well at 1130.  Was unable to get out of the car and was altered.  EMS found him less responsive but without any focal deficits.  Blood sugars were 110 and 130.  They gave him Narcan as he has a history of being on oxycodone .  Minimal change in mental status.  Presents here for further evaluation.  Patient is awake although slow to respond.  Denies any specific complaints.  Knows that it is Sunday and that he was going out to eat.   The history is provided by the EMS personnel and the patient.  Altered Mental Status Presenting symptoms: disorientation and partial responsiveness   Most recent episode:  Today Progression:  Unchanged Chronicity:  New Associated symptoms: no abdominal pain and no headaches        Prior to Admission medications   Medication Sig Start Date End Date Taking? Authorizing Provider  amLODipine (NORVASC) 5 MG tablet Take 5 mg by mouth daily.    [provider]  aspirin  EC 81 MG tablet Take 81 mg by mouth daily.    [provider]  carvedilol  (COREG ) 6.25 MG tablet Take 6.25 mg by mouth 2 (two) times daily with a meal.     [provider]  Coenzyme Q10 200 MG capsule Take 200 mg by mouth daily.    [provider]  Empagliflozin-metFORMIN HCl (SYNJARDY) 12.12-998 MG TABS Take 1 tablet by mouth daily.    [provider]  escitalopram (LEXAPRO) 10 MG tablet Take 10 mg by mouth daily.    [provider]  fish oil-omega-3 fatty acids 1000 MG capsule Take 1  g by mouth daily.     [provider]  gabapentin (NEURONTIN) 300 MG capsule Take 300 mg by mouth 3 (three) times daily.    [provider]  glimepiride (AMARYL) 4 MG tablet Take 4 mg by mouth 2 (two) times daily.     [provider]  glucose blood (ONETOUCH VERIO) test strip check blood sugars 2 times  a day Dx E11.51 In Vitro 12/16/18   [provider]  isosorbide  mononitrate (IMDUR ) 60 MG 24 hr tablet TAKE 1/2 TABLET BY MOUTH DAILY 02/11/23   Walker, Caitlin S, NP  Multiple Vitamins-Minerals (MULTIVITAMIN WITH MINERALS) tablet Take 1 tablet by mouth daily.    [provider]  naproxen sodium (ANAPROX) 220 MG tablet Take 440 mg by mouth 2 (two) times daily as needed (back pain).    [provider]  nitroGLYCERIN  (NITROSTAT ) 0.4 MG SL tablet Place 1 tablet (0.4 mg total) under the tongue every 5 (five) minutes as needed for chest pain. 10/03/14   Verlin Lonni BIRCH, MD  oxyCODONE  (OXY IR/ROXICODONE ) 5 MG immediate release tablet Take 5 mg by mouth every 6 (six) hours as needed for severe pain (pain score 7-10).    [provider]  rosuvastatin  (CRESTOR ) 20 MG tablet TAKE 1 TABLET(20 MG) BY MOUTH DAILY 02/15/24   Verlin Lonni BIRCH, MD  Allergies: Vicodin [hydrocodone-acetaminophen ]    Review of Systems  Gastrointestinal:  Negative for abdominal pain.  Neurological:  Negative for headaches.    Updated Vital Signs BP 102/62   Pulse 65   Temp 97.9 F (36.6 C) (Oral)   Resp 16   SpO2 100%   Physical Exam Vitals and nursing note reviewed.  Constitutional:      General: He is not in acute distress.    Appearance: Normal appearance. He is well-developed.  HENT:     Head: Normocephalic and atraumatic.  Eyes:     Conjunctiva/sclera: Conjunctivae normal.  Cardiovascular:     Rate and Rhythm: Normal rate and regular rhythm.     Heart sounds: No murmur heard. Pulmonary:     Effort: Pulmonary effort is normal. No  respiratory distress.     Breath sounds: Normal breath sounds.  Abdominal:     Palpations: Abdomen is soft.     Tenderness: There is no abdominal tenderness. There is no guarding or rebound.  Musculoskeletal:        General: No deformity.     Cervical back: Neck supple.  Skin:    General: Skin is warm and dry.  Neurological:     General: No focal deficit present.     GCS: GCS eye subscore is 4. GCS verbal subscore is 5. GCS motor subscore is 6.     Cranial Nerves: No cranial nerve deficit.     Sensory: No sensory deficit.     Motor: No weakness.     (all labs ordered are listed, but only abnormal results are displayed) Labs Reviewed  CBC WITH DIFFERENTIAL/PLATELET - Abnormal; Notable for the following components:      Result Value   WBC 11.1 (*)    Hemoglobin 12.2 (*)    HCT 36.9 (*)    RDW 16.9 (*)    Neutro Abs 7.9 (*)    All other components within normal limits  COMPREHENSIVE METABOLIC PANEL WITH GFR - Abnormal; Notable for the following components:   Potassium 3.4 (*)    CO2 21 (*)    Glucose, Bld 159 (*)    Calcium  8.8 (*)    Total Protein 5.1 (*)    Albumin 2.9 (*)    All other components within normal limits  CBG MONITORING, ED - Abnormal; Notable for the following components:   Glucose-Capillary 149 (*)    All other components within normal limits  MAGNESIUM  URINALYSIS, ROUTINE W REFLEX MICROSCOPIC  RAPID URINE DRUG SCREEN, HOSP PERFORMED  ETHANOL  TROPONIN I (HIGH SENSITIVITY)    EKG: EKG Interpretation Date/Time:  Sunday March 19 2024 13:28:54 EDT Ventricular Rate:  62 PR Interval:  199 QRS Duration:  95 QT Interval:  418 QTC Calculation: 425 R Axis:   65  Text Interpretation: Sinus rhythm No significant change since last tracing Confirmed by Towana Sharper [phone]) on 03/19/2024 1:36:32 PM  Radiology: CT Head Wo Contrast Result Date: 03/19/2024 CLINICAL DATA:  Mental status change of unknown cause. EXAM: CT HEAD WITHOUT CONTRAST TECHNIQUE:  Contiguous axial images were obtained from the base of the skull through the vertex without intravenous contrast. RADIATION DOSE REDUCTION: This exam was performed according to the departmental dose-optimization program which includes automated exposure control, adjustment of the mA and/or kV according to patient size and/or use of iterative reconstruction technique. COMPARISON:  12/17/2021. FINDINGS: Brain: No evidence of acute infarction, hemorrhage, hydrocephalus, extra-axial collection or mass lesion/mass effect. Patchy white matter hypoattenuation is noted consistent with  moderate to advanced chronic microvascular ischemic change, stable. Vascular: No hyperdense vessel or unexpected calcification. Skull: Normal. Negative for fracture or focal lesion. Sinuses/Orbits: Globes and orbits are unremarkable. Visualized sinuses are clear. Other: None. IMPRESSION: 1. No acute intracranial abnormalities. Electronically Signed   By: Alm Parkins M.D.   On: 03/19/2024 13:52   DG Chest Port 1 View Result Date: 03/19/2024 CLINICAL DATA:  Weakness. EXAM: PORTABLE CHEST 1 VIEW COMPARISON:  02/03/2020. FINDINGS: Cardiac silhouette is normal in size and configuration. Normal mediastinal and hilar contours. Clear lungs.  No pleural effusion or pneumothorax. Skeletal structures are grossly intact. IMPRESSION: No active disease. Electronically Signed   By: Alm Parkins M.D.   On: 03/19/2024 13:15     Procedures   Medications Ordered in the ED  sodium chloride  0.9 % bolus 1,000 mL (0 mLs Intravenous Stopped 03/19/24 1556)    Clinical Course as of 03/19/24 1512  Sun Mar 19, 2024  1340 Wife said that she thought he passed out while waiting in line.  Head was slumped and he was drooling.  She has not seen that happen with him before.  She does feel like he is improved here and is able to converse with her a little bit.  She said he has been eating and drinking well up until today and they had not eaten but that is why  they were going out for breakfast. [MB]    Clinical Course User Index [MB] Towana Ozell BROCKS, MD                                 Medical Decision Making Amount and/or Complexity of Data Reviewed Labs: ordered. Radiology: ordered.   This patient complains of altered mental status; this involves an extensive number of treatment Options and is a complaint that carries with it a high risk of complications and morbidity. The differential includes stroke, seizure, syncope, arrhythmia, hypoglycemia  I ordered, reviewed and interpreted labs, which included CBC with mildly elevated white count stable low hemoglobin, chemistries with low bicarb elevated glucose, troponin unremarkable I ordered medication IV fluids and reviewed PMP when indicated. I ordered imaging studies which included chest x-ray and head CT and I independently    visualized and interpreted imaging which showed no acute findings Additional history obtained from EMS and patient's family members Previous records obtained and reviewed in epic including recent ED visits Cardiac monitoring reviewed, normal sinus rhythm Social determinants considered, no significant barriers Critical Interventions: None  After the interventions stated above, I reevaluated the patient and found patient to be improving but not back to baseline Admission and further testing considered, his care is signed out to Dr. Randol to follow-up on the rest of the blood work and reassess.  Likely would benefit from mission to the hospital for further workup.      Final diagnoses:  Unresponsive episode    ED Discharge Orders     None          Towana Ozell BROCKS, MD 03/19/24 [phone]

## 2024-03-19 NOTE — ED Provider Notes (Signed)
 Patient initially seen by Dr. Towana.  Please see his note.  Patient presented to the ED for evaluation of altered mental status decreased responsiveness.  Presentation concerning for the possibility of near syncope TIA.  Patient did not have any focal deficits noted on exam.  Initial laboratory test do not show any significant abnormalities.  No definite UTI.  No signs of severe anemia.  No significant electrolyte abnormalities.  Patient has normal cardiac enzymes.  Patient's head CT and chest x-ray did not show any acute abnormality.  Will plan on admission to the hospital for further workup for near syncopal episode/TIA  Case discussed with Dr Alfornia Randol Simmonds, MD 03/19/24 1945

## 2024-03-19 NOTE — ED Notes (Signed)
 Pt attempted to urinate, unable to urinate but felt like he needed to.

## 2024-03-19 NOTE — ED Notes (Signed)
 CCMD called.

## 2024-03-19 NOTE — H&P (Signed)
 History and Physical    Seth Guzman FMW:[id number] DOB: 11-Jun-1946 DOA: 03/19/2024  PCP: Nichole Senior, MD  Patient coming from: Home  Chief Complaint: AMS  HPI: Seth Guzman is a 78 y.o. male with medical history significant of  type 2 diabetes, hypertension, hyperlipidemia, prostate cancer, seizures, arthritis, anxiety/depression, gout, CAD, CHF, GERD presented to the ED via EMS for evaluation of altered mental status and decreased responsiveness.  History provided by the patient and his wife at bedside.  Wife states patient was feeling well this morning and they were about to go out of town.  On the way, they stopped at [facility] drive-through around 11 AM to get food when all of a sudden patient became unresponsive in the car and slumped over.  He was drenched in sweat and drooling.  Wife states she panicked and drove him back to their house which was about 5 minutes away and by then patient had regained consciousness.  Family called EMS.  Patient recalls having chest tightness at that time which has since resolved.  Denies dizziness or shortness of breath.  Denies nausea, vomiting, abdominal pain, or diarrhea.  ED Course: EMS gave him Narcan prior to arrival but there was minimal change in his mental status.  On arrival to the ED, patient was awake but initially slow to respond.  Afebrile.  Not tachycardic, hypotensive, or hypoxic.  Labs notable for WBC count 11.1, hemoglobin 12.2 (previously 15.7 on 01/25/2024), MCV 83.3, potassium 3.4, bicarb 21, glucose 159, calcium  8.8, total protein 5.1, albumin 2.9, normal LFTs, ethanol level pending, troponin negative, magnesium within normal range, UA without signs of infection, UDS negative.  Chest x-ray showing no active disease.  CT head showing no acute intracranial abnormalities.  EKG showing sinus rhythm and no acute changes.  Patient was given 1 L normal saline.  Review of Systems:  Review of Systems  All other systems reviewed and are  negative.   Past Medical History:  Diagnosis Date   Anxiety    Arthritis    CAD in native artery    a. cath 2006 - mod CAD, med rx. b. cath 2016 -  20% prox-mid RCA, 30% ost-prox LAD, 30% mLAd, 99% distal LAD-2, 90% distal LAD-1, 50% D2, 70% D3 -> the LAD disease was small caliber/apical and too small for PCI. EF was 45-50%   Complication of anesthesia    slow to wake up after hernia surgery at the surgical center   Depression    Diabetes mellitus type 2 in nonobese [facility])    GERD (gastroesophageal reflux disease)    Gout    Heart murmur    HTN (hypertension)    Hx of radiation therapy 01/08/14- 03/01/14   prostate 7600 cGy 38 sessions   Hyperlipidemia    Inguinal hernia    recurrent right inguinal hernia   Ischemic cardiomyopathy    a. EF 45-50% by cath 2016.   Meatal stenosis 1966, 1967   Pneumonia    x 1   Prostate cancer (HCC) 08/10/2013   Gleason 6, volume 28.67 cc   Seizures (HCC)    x 1 - yrs ago per patient    Past Surgical History:  Procedure Laterality Date   CARDIAC CATHETERIZATION N/A 01/14/2015   Procedure: Left Heart Cath and Coronary Angiography;  Surgeon: Lonni JONETTA Cash, MD;  Location: [facility] INVASIVE CV LAB;  Service: Cardiovascular;  Laterality: N/A;   CARPAL TUNNEL WITH CUBITAL TUNNEL Right 01/25/2024   Procedure: RIGHT CARPAL TUNNEL AND  CUBITAL TUNNEL RELEASE AND REPAIR AS NECESSARY;  Surgeon: Camella Fallow, MD;  Location: [facility] OR;  Service: Orthopedics;  Laterality: Right;  Right carpal tunnel release.  Right ulnar nerve decompression in situ at the elbow.   HAND SURGERY     INGUINAL HERNIA REPAIR  1966, 1967   bilateral herniorrphoes   LEFT HEART CATH AND CORONARY ANGIOGRAPHY N/A 08/14/2019   Procedure: LEFT HEART CATH AND CORONARY ANGIOGRAPHY;  Surgeon: Verlin Lonni BIRCH, MD;  Location: [facility] INVASIVE CV LAB;  Service: Cardiovascular;  Laterality: N/A;   PROSTATE BIOPSY  08/10/13   gleason 3+3=6, vol 28.67 cc     reports that he has never  smoked. He has never used smokeless tobacco. He reports current alcohol use. He reports that he does not use drugs.  Allergies  Allergen Reactions   Vicodin [Hydrocodone-Acetaminophen ] Other (See Comments)    dizzy and sick feeling/Knocks him out    Family History  Problem Relation Age of Onset   Diabetes Mother    Stroke Mother    Diabetes Sister    Heart attack Sister    Diabetes Brother    Cancer Father        larynx   Stroke Brother     Prior to Admission medications   Medication Sig Start Date End Date Taking? Authorizing Provider  amLODipine (NORVASC) 5 MG tablet Take 5 mg by mouth daily.    [provider]  aspirin  EC 81 MG tablet Take 81 mg by mouth daily.    [provider]  carvedilol  (COREG ) 6.25 MG tablet Take 6.25 mg by mouth 2 (two) times daily with a meal.     [provider]  Coenzyme Q10 200 MG capsule Take 200 mg by mouth daily.    [provider]  Empagliflozin-metFORMIN HCl (SYNJARDY) 12.12-998 MG TABS Take 1 tablet by mouth daily.    [provider]  escitalopram (LEXAPRO) 10 MG tablet Take 10 mg by mouth daily.    [provider]  fish oil-omega-3 fatty acids 1000 MG capsule Take 1 g by mouth daily.     [provider]  gabapentin (NEURONTIN) 300 MG capsule Take 300 mg by mouth 3 (three) times daily.    [provider]  glimepiride (AMARYL) 4 MG tablet Take 4 mg by mouth 2 (two) times daily.     [provider]  glucose blood (ONETOUCH VERIO) test strip check blood sugars 2 times  a day Dx E11.51 In Vitro 12/16/18   [provider]  isosorbide  mononitrate (IMDUR ) 60 MG 24 hr tablet TAKE 1/2 TABLET BY MOUTH DAILY 02/11/23   Walker, Caitlin S, NP  Multiple Vitamins-Minerals (MULTIVITAMIN WITH MINERALS) tablet Take 1 tablet by mouth daily.    [provider]  naproxen sodium (ANAPROX) 220 MG tablet Take 440 mg by mouth 2 (two) times daily as needed (back pain).     [provider]  nitroGLYCERIN  (NITROSTAT ) 0.4 MG SL tablet Place 1 tablet (0.4 mg total) under the tongue every 5 (five) minutes as needed for chest pain. 10/03/14   Verlin Lonni BIRCH, MD  oxyCODONE  (OXY IR/ROXICODONE ) 5 MG immediate release tablet Take 5 mg by mouth every 6 (six) hours as needed for severe pain (pain score 7-10).    [provider]  rosuvastatin  (CRESTOR ) 20 MG tablet TAKE 1 TABLET(20 MG) BY MOUTH DAILY 02/15/24   Verlin Lonni BIRCH, MD    Physical Exam: Vitals:   03/19/24 1545 03/19/24 1700 03/19/24 1855 03/19/24  1915  BP: 112/65 110/60  119/69  Pulse: 77 75  78  Resp: 14 16  12   Temp:   98.6 F (37 C)   TempSrc:   Oral   SpO2: 100% 100%  100%    Physical Exam Vitals reviewed.  Constitutional:      General: He is not in acute distress. HENT:     Head: Normocephalic and atraumatic.  Eyes:     Extraocular Movements: Extraocular movements intact.  Cardiovascular:     Rate and Rhythm: Normal rate and regular rhythm.     Pulses: Normal pulses.     Heart sounds: Normal heart sounds. No murmur heard. Pulmonary:     Effort: Pulmonary effort is normal. No respiratory distress.     Breath sounds: Normal breath sounds. No wheezing, rhonchi or rales.  Abdominal:     General: Bowel sounds are normal. There is no distension.     Palpations: Abdomen is soft.     Tenderness: There is no abdominal tenderness. There is no guarding.  Musculoskeletal:     Cervical back: Normal range of motion.     Right lower leg: No edema.     Left lower leg: No edema.  Skin:    General: Skin is warm and dry.  Neurological:     General: No focal deficit present.     Mental Status: He is alert and oriented to person, place, and time.     Labs on Admission: I have personally reviewed following labs and imaging studies  CBC: Recent Labs  Lab 03/15/24 2056 03/15/24 2059 03/19/24 1259  WBC 8.7  --  11.1*  NEUTROABS 6.9  --  7.9*  HGB 12.8* 13.3 12.2*   HCT 39.5 39.0 36.9*  MCV 82.6  --  83.3  PLT 188  --  178   Basic Metabolic Panel: Recent Labs  Lab 03/15/24 2056 03/15/24 2059 03/19/24 1259 03/19/24 1457  NA 131* 131* 137  --   K 4.3 4.4 3.4*  --   CL 98  --  105  --   CO2 23  --  21*  --   GLUCOSE 466*  --  159*  --   BUN 16  --  11  --   CREATININE 0.95  --  0.95  --   CALCIUM  9.0  --  8.8*  --   MG  --   --   --  1.7   GFR: Estimated Creatinine Clearance: 59 mL/min (by C-G formula based on SCr of 0.95 mg/dL). Liver Function Tests: Recent Labs  Lab 03/15/24 2056 03/19/24 1259  AST 18 18  ALT 29 18  ALKPHOS 101 59  BILITOT 0.7 0.6  PROT 5.9* 5.1*  ALBUMIN 3.4* 2.9*   No results for input(s): LIPASE, AMYLASE in the last 168 hours. No results for input(s): AMMONIA in the last 168 hours. Coagulation Profile: No results for input(s): INR, PROTIME in the last 168 hours. Cardiac Enzymes: No results for input(s): CKTOTAL, CKMB, CKMBINDEX, TROPONINI in the last 168 hours. BNP (last 3 results) No results for input(s): PROBNP in the last 8760 hours. HbA1C: No results for input(s): HGBA1C in the last 72 hours. CBG: Recent Labs  Lab 03/15/24 1933 03/16/24 0311 03/19/24 1253  GLUCAP 476* 298* 149*   Lipid Profile: No results for input(s): CHOL, HDL, LDLCALC, TRIG, CHOLHDL, LDLDIRECT in the last 72 hours. Thyroid  Function Tests: No results for input(s): TSH, T4TOTAL, FREET4, T3FREE, THYROIDAB in the last 72 hours. Anemia Panel:  No results for input(s): VITAMINB12, FOLATE, FERRITIN, TIBC, IRON , RETICCTPCT in the last 72 hours. Urine analysis:    Component Value Date/Time   COLORURINE YELLOW 03/19/2024 1635   APPEARANCEUR CLEAR 03/19/2024 1635   LABSPEC 1.024 03/19/2024 1635   PHURINE 7.0 03/19/2024 1635   GLUCOSEU >=500 (A) 03/19/2024 1635   HGBUR NEGATIVE 03/19/2024 1635   BILIRUBINUR NEGATIVE 03/19/2024 1635   KETONESUR 5 (A) 03/19/2024 1635    PROTEINUR NEGATIVE 03/19/2024 1635   UROBILINOGEN 1.0 02/12/2010 1731   NITRITE NEGATIVE 03/19/2024 1635   LEUKOCYTESUR NEGATIVE 03/19/2024 1635    Radiological Exams on Admission: CT Head Wo Contrast Result Date: 03/19/2024 CLINICAL DATA:  Mental status change of unknown cause. EXAM: CT HEAD WITHOUT CONTRAST TECHNIQUE: Contiguous axial images were obtained from the base of the skull through the vertex without intravenous contrast. RADIATION DOSE REDUCTION: This exam was performed according to the departmental dose-optimization program which includes automated exposure control, adjustment of the mA and/or kV according to patient size and/or use of iterative reconstruction technique. COMPARISON:  12/17/2021. FINDINGS: Brain: No evidence of acute infarction, hemorrhage, hydrocephalus, extra-axial collection or mass lesion/mass effect. Patchy white matter hypoattenuation is noted consistent with moderate to advanced chronic microvascular ischemic change, stable. Vascular: No hyperdense vessel or unexpected calcification. Skull: Normal. Negative for fracture or focal lesion. Sinuses/Orbits: Globes and orbits are unremarkable. Visualized sinuses are clear. Other: None. IMPRESSION: 1. No acute intracranial abnormalities. Electronically Signed   By: Alm Parkins M.D.   On: 03/19/2024 13:52   DG Chest Port 1 View Result Date: 03/19/2024 CLINICAL DATA:  Weakness. EXAM: PORTABLE CHEST 1 VIEW COMPARISON:  02/03/2020. FINDINGS: Cardiac silhouette is normal in size and configuration. Normal mediastinal and hilar contours. Clear lungs.  No pleural effusion or pneumothorax. Skeletal structures are grossly intact. IMPRESSION: No active disease. Electronically Signed   By: Alm Parkins M.D.   On: 03/19/2024 13:15    Assessment and Plan  Altered mental status/syncope Patient is currently AAO x 4 and back to his baseline.  CT head showing no acute intracranial abnormalities and no focal neurodeficit on exam.  Not  hypoglycemic.  UDS negative.  Chest x-ray showing no active disease.  EKG showing sinus rhythm and no acute changes.  Troponin x 1 negative, will repeat.  Echocardiogram ordered.  PE less likely given no tachycardia or hypoxia and no clinical signs of DVT on exam.  He is satting 100% on room air.  Check orthostatics.  EEG ordered given previous history of seizures.  Normocytic anemia Hemoglobin 12.2, previously 15.7 on 01/25/2024.  Patient is not endorsing any symptoms of GI bleed or any other bleeding.  FOBT ordered.  Check iron , ferritin, and TIBC.  Monitor H&H.  Mild hypokalemia Magnesium within normal range.  Replace potassium and monitor labs.  Type 2 diabetes Check A1c and placed on sensitive sliding scale insulin  ACHS.  Chronic CHF No previous echo results in the chart.  No signs of volume overload.  Echocardiogram ordered for syncope evaluation.  Hypertension Hyperlipidemia Anxiety/depression Gout CAD GERD Pharmacy med rec pending.  DVT prophylaxis: SCDs until FOBT is checked. Code Status: Full Code (discussed with the patient and his wife) Family Communication: Wife at bedside. Level of care: Telemetry bed Admission status: It is my clinical opinion that referral for OBSERVATION is reasonable and necessary in this patient based on the above information provided. The aforementioned taken together are felt to place the patient at high risk for further clinical deterioration. However, it is anticipated that  the patient may be medically stable for discharge from the hospital within 24 to 48 hours.  Editha Ram MD [facility]  If 7PM-7AM, please contact night-coverage [email or web address]  03/19/2024, 8:51 PM

## 2024-03-19 NOTE — ED Notes (Signed)
 Patient transported to CT

## 2024-03-19 NOTE — ED Notes (Signed)
 Family at bedside.

## 2024-03-19 NOTE — ED Notes (Addendum)
 Bladder scan: , MD made aware.

## 2024-03-19 NOTE — ED Notes (Signed)
 CCMD Called

## 2024-03-20 ENCOUNTER — Observation Stay (HOSPITAL_COMMUNITY)
Admit: 2024-03-20 | Discharge: 2024-03-20 | Disposition: A | Discharge status: Home / Self Care | Attending: Internal Medicine | Admitting: Internal Medicine

## 2024-03-20 ENCOUNTER — Observation Stay (HOSPITAL_BASED_OUTPATIENT_CLINIC_OR_DEPARTMENT_OTHER)

## 2024-03-20 DIAGNOSIS — R569 Unspecified convulsions: Secondary | ICD-10-CM

## 2024-03-20 DIAGNOSIS — R4182 Altered mental status, unspecified: Secondary | ICD-10-CM

## 2024-03-20 DIAGNOSIS — R55 Syncope and collapse: Secondary | ICD-10-CM

## 2024-03-20 LAB — CBC
HCT: 38.2 % — ABNORMAL LOW (ref 39.0–52.0)
Hemoglobin: 12.6 g/dL — ABNORMAL LOW (ref 13.0–17.0)
MCH: 27.5 pg (ref 26.0–34.0)
MCHC: 33 g/dL (ref 30.0–36.0)
MCV: 83.2 fL (ref 80.0–100.0)
Platelets: 184 K/uL (ref 150–400)
RBC: 4.59 MIL/uL (ref 4.22–5.81)
RDW: 17.2 % — ABNORMAL HIGH (ref 11.5–15.5)
WBC: 9.4 K/uL (ref 4.0–10.5)
nRBC: 0 % (ref 0.0–0.2)

## 2024-03-20 LAB — GLUCOSE, CAPILLARY
Glucose-Capillary: 93 mg/dL (ref 70–99)
Glucose-Capillary: 217 mg/dL — ABNORMAL HIGH (ref 70–99)
Glucose-Capillary: 204 mg/dL — ABNORMAL HIGH (ref 70–99)
Glucose-Capillary: 166 mg/dL — ABNORMAL HIGH (ref 70–99)

## 2024-03-20 LAB — BASIC METABOLIC PANEL WITH GFR
Anion gap: 10 (ref 5–15)
BUN: 11 mg/dL (ref 8–23)
CO2: 23 mmol/L (ref 22–32)
Calcium: 8.8 mg/dL — ABNORMAL LOW (ref 8.9–10.3)
Chloride: 105 mmol/L (ref 98–111)
Creatinine, Ser: 0.79 mg/dL (ref 0.61–1.24)
GFR, Estimated: >60 mL/min (ref >60)
Glucose, Bld: 103 mg/dL — ABNORMAL HIGH (ref 70–99)
Potassium: 4 mmol/L (ref 3.5–5.1)
Sodium: 138 mmol/L (ref 135–145)

## 2024-03-20 LAB — ECHOCARDIOGRAM COMPLETE
AR max vel: 3.47 cm2
AV Area VTI: 3.34 cm2
AV Area mean vel: 3.16 cm2
AV Mean grad: 2 mmHg
AV Peak grad: 3.5 mmHg
Ao pk vel: 0.93 m/s
Area-P 1/2: 3.48 cm2
Calc EF: 64.2 %
Height: 71 in
S' Lateral: 2.9 cm
Single Plane A2C EF: 61.9 %
Single Plane A4C EF: 63.4 %
Weight: 2338.64 [oz_av]

## 2024-03-20 MED ORDER — FERROUS SULFATE 325 (65 FE) MG PO TABS
325.0000 mg | ORAL_TABLET | ORAL | Status: DC
  Administered 2024-03-20: 325 mg via ORAL
  Filled 2024-03-20: qty 1

## 2024-03-20 NOTE — Procedures (Signed)
 Patient Name: Seth Guzman  MRN: [id number]  Epilepsy Attending: Arlin MALVA Krebs  Referring Physician/Provider: Alfornia Madison, MD  Date: 03/20/2024 Duration: 22.52 mins  Patient history: 78yo M with ams/syncope. EEG to evaluate for seizure.  Level of alertness: Awake, asleep  AEDs during EEG study: None  Technical aspects: This EEG study was done with scalp electrodes positioned according to the 10-20 International system of electrode placement. Electrical activity was reviewed with band pass filter of 1-70Hz , sensitivity of 7 uV/mm, display speed of 61mm/sec with a 60Hz  notched filter applied as appropriate. EEG data were recorded continuously and digitally stored.  Video monitoring was available and reviewed as appropriate.  Description: The posterior dominant rhythm consists of 8-9 Hz activity of moderate voltage (25-35 uV) seen predominantly in posterior head regions, symmetric and reactive to eye opening and eye closing. Sleep was characterized by vertex waves, sleep spindles (12 to 14 Hz), maximal frontocentral region.  Physiologic photic driving was not seen during photic stimulation.  Hyperventilation was not performed.     IMPRESSION: This study is within normal limits. No seizures or epileptiform discharges were seen throughout the recording.  A normal interictal EEG does not exclude the diagnosis of epilepsy.   Jeanine Caven O Harlym Gehling

## 2024-03-20 NOTE — Progress Notes (Signed)
  Progress Note   Patient: Seth Guzman FMW:[id number] DOB: [birth year] DOA: 03/19/2024     0 DOS: the patient was seen and examined on 03/20/2024   Assessment and Plan: AMS/syncope  - Cardiac monitoring  - PT/OT as son at the bedside reports pt has been weak   Normocytic anemia  - Ferritin normal  - Iron  level low 34 --> Ferrous sulfate  325 mg every other day   Hypokalemia  - Resolved  DM2 (A1c 10.5) - Novolog  sliding scale   Chronic CHF  - ECHO pending for syncope workup - Monitor       Subjective: Pt seen and examined at the bedside. Pt is alert and oriented x 3 this morning. Son at bedside reports that pt has been feeling weak for the past few weeks. Formal PT/OT eval has been ordered. ECHO is pending.  Physical Exam: Vitals:   03/19/24 2300 03/20/24 0354 03/20/24 0500 03/20/24 0738  BP:  124/71  132/70  Pulse:  71    Resp:  19  18  Temp:  98.5 F (36.9 C)  98.1 F (36.7 C)  TempSrc:  Oral  Oral  SpO2:  100%  100%  Weight:   66.3 kg   Height: 5' 11 (1.803 m)  5' 11 (1.803 m)    Physical Exam HENT:     Head: Normocephalic.     Mouth/Throat:     Mouth: Mucous membranes are moist.  Cardiovascular:     Rate and Rhythm: Normal rate.  Pulmonary:     Effort: Pulmonary effort is normal.  Abdominal:     Palpations: Abdomen is soft.  Musculoskeletal:        General: Normal range of motion.  Skin:    General: Skin is warm.  Neurological:     Mental Status: He is alert. Mental status is at baseline.  Psychiatric:        Mood and Affect: Mood normal.      Disposition: Status is: Observation The patient remains OBS appropriate and will d/c before 2 midnights.  Planned Discharge Destination: Home    Time spent: 35 minutes  Author: Deira Shimer , MD 03/20/2024 9:53 AM  For on call review [email or web address].

## 2024-03-20 NOTE — Plan of Care (Signed)

## 2024-03-20 NOTE — Care Management Obs Status (Signed)
 MEDICARE OBSERVATION STATUS NOTIFICATION   Patient Details  Name: KOLTON KIENLE MRN: [id number] Date of Birth: 1946/01/07   Medicare Observation Status Notification Given:  Yes    Joydan Gretzinger Crawford 03/20/2024, 12:24 PM

## 2024-03-20 NOTE — Progress Notes (Signed)
EEG complete. Results pending.  ?

## 2024-03-20 NOTE — Inpatient Diabetes Management (Signed)
 Inpatient Diabetes Program Recommendations  AACE/ADA: New Consensus Statement on Inpatient Glycemic Control (2015)  Target Ranges:  Prepandial:   less than 140 mg/dL      Peak postprandial:   less than 180 mg/dL (1-2 hours)      Critically ill patients:  140 - 180 mg/dL   Lab Results  Component Value Date   GLUCAP 217 (H) 03/20/2024   HGBA1C 10.5 (H) 03/19/2024    Review of Glycemic Control  Latest Reference Range & Units 03/19/24 23:00 03/20/24 07:20 03/20/24 11:33  Glucose-Capillary 70 - 99 mg/dL 850 (H) 93 782 (H)  (H): Data is abnormally high Diabetes history: Type 2 DM Outpatient Diabetes medications: Novolog  5 units TID (while on steroids), Synjardy 12.12-998 mg every day, Amaryl 4 mg BID Current orders for Inpatient glycemic control: Novolog  0-9 units TID & HS  Inpatient Diabetes Program Recommendations:    Spoke with patient and wife regarding outpatient diabetes management.  Reviewed patient's current A1c of 10.5%. Explained what a A1c is and what it measures. Also reviewed goal A1c with patient, importance of good glucose control @ home, and blood sugar goals. Reviewed patho of DM, role of insulin  and pancreas, basic survival skills, interventions, vascular changes, impact of steroids and commorbidities.  Patient has a meter at home and uses atleast 3 times per day. Denies issues with hypoglycemia since starting insulin . PCP recently started insulin  this week while on steroids (until August). Has another PCP visit coming up this week.  Reviewed importance of CHO mindfulness, alternatives to CHO beverages and importance of increasing protein. No additional questions at this time.   Thanks, Tinnie Minus, MSN, RNC-OB Diabetes Coordinator [phone] (8a-5p)

## 2024-03-21 DIAGNOSIS — I10 Essential (primary) hypertension: Secondary | ICD-10-CM | POA: Diagnosis not present

## 2024-03-21 DIAGNOSIS — D509 Iron deficiency anemia, unspecified: Secondary | ICD-10-CM

## 2024-03-21 DIAGNOSIS — E876 Hypokalemia: Secondary | ICD-10-CM | POA: Diagnosis not present

## 2024-03-21 DIAGNOSIS — E1169 Type 2 diabetes mellitus with other specified complication: Secondary | ICD-10-CM | POA: Diagnosis not present

## 2024-03-21 DIAGNOSIS — R55 Syncope and collapse: Secondary | ICD-10-CM | POA: Diagnosis not present

## 2024-03-21 LAB — COMPREHENSIVE METABOLIC PANEL WITH GFR
ALT: 16 U/L (ref 0–44)
AST: 12 U/L — ABNORMAL LOW (ref 15–41)
Albumin: 3 g/dL — ABNORMAL LOW (ref 3.5–5.0)
Alkaline Phosphatase: 58 U/L (ref 38–126)
Anion gap: 9 (ref 5–15)
BUN: 11 mg/dL (ref 8–23)
CO2: 23 mmol/L (ref 22–32)
Calcium: 8.9 mg/dL (ref 8.9–10.3)
Chloride: 102 mmol/L (ref 98–111)
Creatinine, Ser: 0.72 mg/dL (ref 0.61–1.24)
GFR, Estimated: >60 mL/min (ref >60)
Glucose, Bld: 197 mg/dL — ABNORMAL HIGH (ref 70–99)
Potassium: 3.7 mmol/L (ref 3.5–5.1)
Sodium: 134 mmol/L — ABNORMAL LOW (ref 135–145)
Total Bilirubin: 0.5 mg/dL (ref 0.0–1.2)
Total Protein: 5.2 g/dL — ABNORMAL LOW (ref 6.5–8.1)

## 2024-03-21 LAB — CBC
HCT: 39.9 % (ref 39.0–52.0)
Hemoglobin: 13.2 g/dL (ref 13.0–17.0)
MCH: 27.4 pg (ref 26.0–34.0)
MCHC: 33.1 g/dL (ref 30.0–36.0)
MCV: 82.8 fL (ref 80.0–100.0)
Platelets: 180 K/uL (ref 150–400)
RBC: 4.82 MIL/uL (ref 4.22–5.81)
RDW: 17.3 % — ABNORMAL HIGH (ref 11.5–15.5)
WBC: 8.5 K/uL (ref 4.0–10.5)
nRBC: 0 % (ref 0.0–0.2)

## 2024-03-21 LAB — PHOSPHORUS: Phosphorus: 3.9 mg/dL (ref 2.5–4.6)

## 2024-03-21 LAB — MAGNESIUM: Magnesium: 1.7 mg/dL (ref 1.7–2.4)

## 2024-03-21 LAB — GLUCOSE, CAPILLARY
Glucose-Capillary: 176 mg/dL — ABNORMAL HIGH (ref 70–99)
Glucose-Capillary: 220 mg/dL — ABNORMAL HIGH (ref 70–99)

## 2024-03-21 MED ORDER — POLYSACCHARIDE IRON COMPLEX 150 MG PO CAPS: 150.0000 mg | ORAL_CAPSULE | Freq: Every day | ORAL | 0 refills | Status: AC

## 2024-03-21 MED ORDER — ONETOUCH ULTRASOFT LANCETS MISC: 12 refills | Status: AC

## 2024-03-21 MED ORDER — ONETOUCH VERIO VI STRP: ORAL_STRIP | 12 refills | Status: AC

## 2024-03-21 NOTE — Evaluation (Signed)
 Occupational Therapy Evaluation Patient Details Name: Seth Guzman MRN: [id number] DOB: 23-Dec-1945 Today's Date: 03/21/2024   History of Present Illness   Patient is a 78 year old male with AMS, decreased responsiveness. PMH:  type 2 diabetes, hypertension, hyperlipidemia, prostate cancer, seizures, arthritis, anxiety/depression, gout, CAD, CHF, GERD     Clinical Impressions Pt is at Ind - mod I baseline level of function with ADLs and ADL mobility. PTA pt lives with his wife, drives and used no AD for mobility. All education completed and no further acute OT services are indicated at this time, OT will sign off     If plan is discharge home, recommend the following:   Assist for transportation     Functional Status Assessment   Patient has not had a recent decline in their functional status     Equipment Recommendations   Tub/shower bench     Recommendations for Other Services         Precautions/Restrictions   Precautions Precautions: Fall Recall of Precautions/Restrictions: Intact Restrictions Weight Bearing Restrictions Per Provider Order: No     Mobility Bed Mobility Overal bed mobility: Modified Independent             General bed mobility comments: increased time    Transfers Overall transfer level: Modified independent                        Balance   Sitting-balance support: Feet supported Sitting balance-Leahy Scale: Good     Standing balance support: No upper extremity supported, During functional activity Standing balance-Leahy Scale: Fair                             ADL either performed or assessed with clinical judgement   ADL Overall ADL's : Independent;Modified independent;At baseline                                             Vision Baseline Vision/History: 1 Wears glasses Ability to See in Adequate Light: 0 Adequate Patient Visual Report: No change from baseline        Perception         Praxis         Pertinent Vitals/Pain Pain Assessment Pain Assessment: No/denies pain     Extremity/Trunk Assessment Upper Extremity Assessment Upper Extremity Assessment: Overall WFL for tasks assessed   Lower Extremity Assessment Lower Extremity Assessment: Defer to PT evaluation       Communication Communication Communication: No apparent difficulties   Cognition Arousal: Alert Behavior During Therapy: [facility] for tasks assessed/performed                                 Following commands: Intact       Cueing  General Comments   Cueing Techniques: Verbal cues      Exercises     Shoulder Instructions      Home Living Family/patient expects to be discharged to:: Private residence Living Arrangements: Spouse/significant other Available Help at Discharge: Family Type of Home: House Home Access: Stairs to enter [employer] of Steps: 3         Bathroom Shower/Tub: Tub/shower unit;Walk-in Human resources officer: Standard     Home Equipment: Cane - single  point          Prior Functioning/Environment               Mobility Comments: intermittent use of cane. no falls reported ADLs Comments: independent    OT Problem List: Decreased activity tolerance   OT Treatment/Interventions:        OT Goals(Current goals can be found in the care plan section)   Acute Rehab OT Goals Patient Stated Goal: go home OT Goal Formulation: With patient/family   OT Frequency:       Co-evaluation              AM-PAC OT 6 Clicks Daily Activity     Outcome Measure Help from another person eating meals?: None Help from another person taking care of personal grooming?: None Help from another person toileting, which includes using toliet, bedpan, or urinal?: None Help from another person bathing (including washing, rinsing, drying)?: None Help from another person to put on and taking off regular upper  body clothing?: None Help from another person to put on and taking off regular lower body clothing?: None 6 Click Score: 24   End of Session Equipment Utilized During Treatment: Gait belt Nurse Communication: Mobility status  Activity Tolerance: Patient tolerated treatment well Patient left: in bed;with call bell/phone within reach;with family/visitor present  OT Visit Diagnosis: Muscle weakness (generalized) (M62.81)                Time: 8987-8967 OT Time Calculation (min): 20 min Charges:  OT Evaluation $OT Eval Low Complexity: 1 Low   Jacques Karna Loose 03/21/2024, 1:41 PM

## 2024-03-21 NOTE — Assessment & Plan Note (Signed)
 03-21-2024 stable.

## 2024-03-21 NOTE — Progress Notes (Signed)
 PROGRESS NOTE    Seth Guzman  FMW:[id number] DOB: 02-15-1946 DOA: 03/19/2024 PCP: Nichole Senior, MD  Subjective: Pt seen and examined. Met with pt and his delightful wife Almarie at bedside.  Patient states that he and his wife are planning on going out of town yesterday.  He had gotten up as usual.  He took his medications in the morning which includes Amaryl.  He did not eat any breakfast.  It had been about 13 hours since his last meal.  Since they are planning on going out of town, he took his meds and planned on going to [employer] for breakfast.  While in line in the drive-through, he started feeling shaky.  He has never had hypoglycemia before.  Wife states that patient was diaphoretic.  He did not have any chest pain.  He passed out for about 5 minutes.  EMS was called.  His reported blood sugar was 137 with EMS.  Patient has worked successfully with physical therapy and occupational Therapy.  He has no acute needs.  Telemetry shows no arrhythmias.  Echo shows normal LV function 55 to 60%.  No other abnormalities.  He does have some hypokinesis of the apical segments.  His CT head was negative for any abnormalities.  My suspicion was that he probably got hypoglycemic while waiting in line for food at [employer].  He had taken his oral diabetic medications but did not eat right away.  He did not feel any palpitations.  I recommended to him that he take his medications with meals or take his medications after his meals.  He is stable for discharge.   Hospital Course: HPI: Seth Guzman is a 78 y.o. male with medical history significant of  type 2 diabetes, hypertension, hyperlipidemia, prostate cancer, seizures, arthritis, anxiety/depression, gout, CAD, CHF, GERD presented to the ED via EMS for evaluation of altered mental status and decreased responsiveness.  History provided by the patient and his wife at bedside.  Wife states patient was feeling well this morning and they were  about to go out of town.  On the way, they stopped at [facility] drive-through around 11 AM to get food when all of a sudden patient became unresponsive in the car and slumped over.  He was drenched in sweat and drooling.  Wife states she panicked and drove him back to their house which was about 5 minutes away and by then patient had regained consciousness.  Family called EMS.  Patient recalls having chest tightness at that time which has since resolved.  Denies dizziness or shortness of breath.  Denies nausea, vomiting, abdominal pain, or diarrhea.   ED Course: EMS gave him Narcan prior to arrival but there was minimal change in his mental status.  On arrival to the ED, patient was awake but initially slow to respond.  Afebrile.  Not tachycardic, hypotensive, or hypoxic.  Labs notable for WBC count 11.1, hemoglobin 12.2 (previously 15.7 on 01/25/2024), MCV 83.3, potassium 3.4, bicarb 21, glucose 159, calcium  8.8, total protein 5.1, albumin 2.9, normal LFTs, ethanol level pending, troponin negative, magnesium within normal range, UA without signs of infection, UDS negative.  Chest x-ray showing no active disease.  CT head showing no acute intracranial abnormalities.  EKG showing sinus rhythm and no acute changes.  Patient was given 1 L normal saline.  Significant Events: Admitted 03/19/2024 for altered mental status   Admission Labs: WBC 11.1, HgB 12.2, plt 178 Na 137, K 3.4, CO2 of 21, BUN 11, Scr  0.95, glu 159 T prot 5.1, alb 2.9, AST 18, ALT 18, alk phos 59, T. Bili 0.6 Troponin 12 -> 11 UDS negative UA glu>500, ketones 5, otherwise negative Fe 34, TIBC 260, %sat 13, ferritin 216  Admission Imaging Studies: CXR No active disease.  CT head No acute intracranial abnormalities   Significant Labs: A1C 10.5%  Significant Imaging Studies: Echo LVEF 55-60%  Antibiotic Therapy: Anti-infectives (From admission, onward)    None       Procedures: EEG negative for  seizures  Consultants:     Assessment and Plan: * Syncope 03-21-2024 Patient states that he and his wife are planning on going out of town yesterday.  He had gotten up as usual.  He took his medications in the morning which includes Amaryl.  He did not eat any breakfast.  It had been about 13 hours since his last meal.  Since they are planning on going out of town, he took his meds and planned on going to [employer] for breakfast.  While in line in the drive-through, he started feeling shaky.  He has never had hypoglycemia before.  Wife states that patient was diaphoretic.  He did not have any chest pain.  He passed out for about 5 minutes.  EMS was called.  His reported blood sugar was 137 with EMS.  Patient has worked successfully with physical therapy and occupational Therapy.  He has no acute needs.  Telemetry shows no arrhythmias.  Echo shows normal LV function 55 to 60%.  No other abnormalities.  He does have some hypokinesis of the apical segments.  His CT head was negative for any abnormalities.  My suspicion was that he probably got hypoglycemic while waiting in line for food at [employer].  He had taken his oral diabetic medications but did not eat right away.  He did not feel any palpitations.  I recommended to him that he take his medications with meals or take his medications after his meals.  He is stable for discharge.  Type 2 diabetes mellitus (HCC) 03-21-2024 A1c of 10.5% pt referred back to Dr. Nichole for DM management. Pt would benefit from continuous glucose monitoring given suspicion for diabetic hypoglycemia yesterday.  Hypokalemia 03-21-2024 repleted and resolved. DC K of 3.7  Iron  deficiency anemia, unspecified 03-21-2024 pt will need referral to GI for his iron  deficiency anemia. Defer to PCP to make referral. DC to home with nuiron 150 mg daily x 30 days.  Iron /TIBC/Ferritin/ %Sat    Component Value Date/Time   IRON  34 (L) 03/19/2024 2202   TIBC 260 03/19/2024  2202   FERRITIN 216 03/19/2024 2202   IRONPCTSAT 13 (L) 03/19/2024 2202     HYPERTENSION, BENIGN 03-21-2024 stable.   DVT prophylaxis: SCDs Start: 03/19/24 2101    Code Status: Full Code Family Communication: discussed with wife and pt at bedside Disposition Plan: return home Reason for continuing need for hospitalization: stable for DC.  Objective: Vitals:   03/20/24 1648 03/20/24 2101 03/21/24 0457 03/21/24 0702  BP: 134/82 134/83 127/72 137/69  Pulse: 71 67 73 73  Resp: 18 18 18 18   Temp: 98.6 F (37 C) 98.5 F (36.9 C) 98.2 F (36.8 C) 98 F (36.7 C)  TempSrc: Oral Oral Oral Oral  SpO2: 100% 100% 100% 100%  Weight:      Height:        Intake/Output Summary (Last 24 hours) at 03/21/2024 1429 Last data filed at 03/21/2024 0458 Gross per 24 hour  Intake 480 ml  Output 1550 ml  Net -1070 ml   Filed Weights   03/20/24 0500  Weight: 66.3 kg    Examination:  Physical Exam Vitals and nursing note reviewed.  Constitutional:      General: He is not in acute distress.    Appearance: He is normal weight. He is not toxic-appearing or diaphoretic.  HENT:     Head: Normocephalic and atraumatic.  Eyes:     General: No scleral icterus. Cardiovascular:     Rate and Rhythm: Normal rate and regular rhythm.  Pulmonary:     Effort: Pulmonary effort is normal.     Breath sounds: Normal breath sounds.  Abdominal:     General: Abdomen is flat. Bowel sounds are normal.     Palpations: Abdomen is soft.  Musculoskeletal:     Right lower leg: No edema.     Left lower leg: No edema.  Skin:    General: Skin is warm and dry.     Capillary Refill: Capillary refill takes less than 2 seconds.  Neurological:     General: No focal deficit present.     Mental Status: He is alert and oriented to person, place, and time.   Data Reviewed: I have personally reviewed following labs and imaging studies  CBC: Recent Labs  Lab 03/15/24 2056 03/15/24 2059 03/19/24 1259  03/20/24 0541 03/21/24 0504  WBC 8.7  --  11.1* 9.4 8.5  NEUTROABS 6.9  --  7.9*  --   --   HGB 12.8* 13.3 12.2* 12.6* 13.2  HCT 39.5 39.0 36.9* 38.2* 39.9  MCV 82.6  --  83.3 83.2 82.8  PLT 188  --  178 184 180   Basic Metabolic Panel: Recent Labs  Lab 03/15/24 2056 03/15/24 2059 03/19/24 1259 03/19/24 1457 03/20/24 0541 03/21/24 0504  NA 131* 131* 137  --  138 134*  K 4.3 4.4 3.4*  --  4.0 3.7  CL 98  --  105  --  105 102  CO2 23  --  21*  --  23 23  GLUCOSE 466*  --  159*  --  103* 197*  BUN 16  --  11  --  11 11  CREATININE 0.95  --  0.95  --  0.79 0.72  CALCIUM  9.0  --  8.8*  --  8.8* 8.9  MG  --   --   --  1.7  --  1.7  PHOS  --   --   --   --   --  3.9   GFR: Estimated Creatinine Clearance: 70.2 mL/min (by C-G formula based on SCr of 0.72 mg/dL). Liver Function Tests: Recent Labs  Lab 03/15/24 2056 03/19/24 1259 03/21/24 0504  AST 18 18 12*  ALT 29 18 16   ALKPHOS 101 59 58  BILITOT 0.7 0.6 0.5  PROT 5.9* 5.1* 5.2*  ALBUMIN 3.4* 2.9* 3.0*   HbA1C: Recent Labs    03/19/24 2202  HGBA1C 10.5*   CBG: Recent Labs  Lab 03/20/24 1133 03/20/24 1643 03/20/24 2055 03/21/24 0701 03/21/24 1141  GLUCAP 217* 204* 166* 176* 220*   Anemia Panel: Recent Labs    03/19/24 2202  FERRITIN 216  TIBC 260  IRON  34*   Radiology Studies: EEG adult Result Date: 03/20/2024 Shelton Arlin KIDD, MD     03/20/2024  3:41 PM Patient Name: MARVIE CALENDER MRN: [id number] Epilepsy Attending: Arlin KIDD Shelton Referring Physician/Provider: Alfornia Madison, MD Date: 03/20/2024 Duration: 22.52 mins Patient history: 77yo CHRISTELLA  with ams/syncope. EEG to evaluate for seizure. Level of alertness: Awake, asleep AEDs during EEG study: None Technical aspects: This EEG study was done with scalp electrodes positioned according to the 10-20 International system of electrode placement. Electrical activity was reviewed with band pass filter of 1-70Hz , sensitivity of 7 uV/mm, display speed of  6mm/sec with a 60Hz  notched filter applied as appropriate. EEG data were recorded continuously and digitally stored.  Video monitoring was available and reviewed as appropriate. Description: The posterior dominant rhythm consists of 8-9 Hz activity of moderate voltage (25-35 uV) seen predominantly in posterior head regions, symmetric and reactive to eye opening and eye closing. Sleep was characterized by vertex waves, sleep spindles (12 to 14 Hz), maximal frontocentral region.  Physiologic photic driving was not seen during photic stimulation.  Hyperventilation was not performed.   IMPRESSION: This study is within normal limits. No seizures or epileptiform discharges were seen throughout the recording. A normal interictal EEG does not exclude the diagnosis of epilepsy. Arlin MALVA Krebs   ECHOCARDIOGRAM COMPLETE Result Date: 03/20/2024    ECHOCARDIOGRAM REPORT   Patient Name:   RICKY GALLERY Muse Date of Exam: 03/20/2024 Medical Rec #:  [id number]     Height:       71.0 in Accession #:    [id number]    Weight:       146.2 lb Date of Birth:  [birth year]     BSA:          1.845 m Patient Age:    77 years      BP:           134/71 mmHg Patient Gender: M             HR:           68 bpm. Exam Location:  Inpatient Procedure: 2D Echo, Cardiac Doppler and Color Doppler (Both Spectral and Color            Flow Doppler were utilized during procedure). Indications:    Syncope  History:        Patient has no prior history of Echocardiogram examinations.                 CHF, CAD, Signs/Symptoms:Syncope; Risk Factors:Diabetes.  Sonographer:    Hobert Dawn RDCS Referring Phys: [id number] VASUNDHRA RATHORE IMPRESSIONS  1. Left ventricular ejection fraction, by estimation, is 55 to 60%. The left ventricle has normal function. The left ventricle demonstrates regional wall motion abnormalities (see scoring diagram/findings for description). Left ventricular diastolic parameters were normal. There is mild hypokinesis of the left  ventricular, entire apical segment.  2. Right ventricular systolic function is normal. The right ventricular size is normal. Tricuspid regurgitation signal is inadequate for assessing PA pressure.  3. The mitral valve is normal in structure. No evidence of mitral valve regurgitation. No evidence of mitral stenosis.  4. The aortic valve is normal in structure. Aortic valve regurgitation is not visualized. No aortic stenosis is present.  5. The inferior vena cava is dilated in size with >50% respiratory variability, suggesting right atrial pressure of 8 mmHg. FINDINGS  Left Ventricle: Left ventricular ejection fraction, by estimation, is 55 to 60%. The left ventricle has normal function. The left ventricle demonstrates regional wall motion abnormalities. Mild hypokinesis of the left ventricular, entire apical segment.  The left ventricular internal cavity size was normal in size. There is no left ventricular hypertrophy. Left ventricular diastolic parameters were normal.  LV Wall Scoring: The apical septal segment,  apical anterior segment, apical inferior segment, and apex are hypokinetic. Right Ventricle: The right ventricular size is normal. No increase in right ventricular wall thickness. Right ventricular systolic function is normal. Tricuspid regurgitation signal is inadequate for assessing PA pressure. Left Atrium: Left atrial size was normal in size. Right Atrium: Right atrial size was normal in size. Pericardium: There is no evidence of pericardial effusion. Mitral Valve: The mitral valve is normal in structure. No evidence of mitral valve regurgitation. No evidence of mitral valve stenosis. Tricuspid Valve: The tricuspid valve is normal in structure. Tricuspid valve regurgitation is trivial. No evidence of tricuspid stenosis. Aortic Valve: The aortic valve is normal in structure. Aortic valve regurgitation is not visualized. No aortic stenosis is present. Aortic valve mean gradient measures 2.0 mmHg. Aortic  valve peak gradient measures 3.5 mmHg. Aortic valve area, by VTI measures 3.34 cm. Pulmonic Valve: The pulmonic valve was not well visualized. Pulmonic valve regurgitation is not visualized. No evidence of pulmonic stenosis. Aorta: The aortic root is normal in size and structure. Venous: The inferior vena cava is dilated in size with greater than 50% respiratory variability, suggesting right atrial pressure of 8 mmHg. IAS/Shunts: No atrial level shunt detected by color flow Doppler.  LEFT VENTRICLE PLAX 2D LVIDd:         4.40 cm     Diastology LVIDs:         2.90 cm     LV e' medial:    8.45 cm/s LV PW:         0.90 cm     LV E/e' medial:  7.8 LV IVS:        1.10 cm     LV e' lateral:   8.58 cm/s LVOT diam:     2.10 cm     LV E/e' lateral: 7.7 LV SV:         68 LV SV Index:   37 LVOT Area:     3.46 cm  LV Volumes (MOD) LV vol d, MOD A2C: 66.4 ml LV vol d, MOD A4C: 83.5 ml LV vol s, MOD A2C: 25.3 ml LV vol s, MOD A4C: 30.6 ml LV SV MOD A2C:     41.1 ml LV SV MOD A4C:     83.5 ml LV SV MOD BP:      49.9 ml RIGHT VENTRICLE RV S prime:     12.20 cm/s TAPSE (M-mode): 1.9 cm LEFT ATRIUM             Index        RIGHT ATRIUM           Index LA Vol (A2C):   41.8 ml 22.65 ml/m  RA Area:     11.80 cm LA Vol (A4C):   24.4 ml 13.22 ml/m  RA Volume:   24.10 ml  13.06 ml/m LA Biplane Vol: 32.8 ml 17.77 ml/m  AORTIC VALVE AV Area (Vmax):    3.47 cm AV Area (Vmean):   3.16 cm AV Area (VTI):     3.34 cm AV Vmax:           93.00 cm/s AV Vmean:          64.800 cm/s AV VTI:            0.202 m AV Peak Grad:      3.5 mmHg AV Mean Grad:      2.0 mmHg LVOT Vmax:         93.10 cm/s LVOT Vmean:  59.200 cm/s LVOT VTI:          0.195 m LVOT/AV VTI ratio: 0.97  AORTA Ao Root diam: 3.20 cm Ao Asc diam:  3.30 cm MITRAL VALVE MV Area (PHT): 3.48 cm    SHUNTS MV Decel Time: 218 msec    Systemic VTI:  0.20 m MV E velocity: 66.00 cm/s  Systemic Diam: 2.10 cm MV A velocity: 75.50 cm/s MV E/A ratio:  0.87 Mihai Croitoru MD  Electronically signed by Jerel Balding MD Signature Date/Time: 03/20/2024/1:27:42 PM    Final     Scheduled Meds:  ferrous sulfate   325 mg Oral Q48H   insulin  aspart  0-5 Units Subcutaneous QHS   insulin  aspart  0-9 Units Subcutaneous TID WC   Continuous Infusions:   LOS: 0 days   Time spent: 55 minutes  Camellia Door, DO  [facility]  03/21/2024, 2:29 PM

## 2024-03-21 NOTE — Assessment & Plan Note (Addendum)
 03-21-2024 pt will need referral to GI for his iron  deficiency anemia. Defer to PCP to make referral. DC to home with nuiron 150 mg daily x 30 days.  Iron /TIBC/Ferritin/ %Sat    Component Value Date/Time   IRON  34 (L) 03/19/2024 2202   TIBC 260 03/19/2024 2202   FERRITIN 216 03/19/2024 2202   IRONPCTSAT 13 (L) 03/19/2024 2202

## 2024-03-21 NOTE — Discharge Summary (Signed)
 [facility] Phys[facility] Discharge Summary   Patient name: Seth Guzman  Admit date:     03/19/2024  Discharge date: 03/21/2024  Attending Physician: RATHORE, VASUNDHRA [[id number]]  Discharge Physician: Camellia Door   PCP: Nichole Senior, MD  Admitted From: Home  Disposition:  Home  Recommendations for Outpatient Follow-up:  Follow up with PCP in 1-2 weeks Pt will need referral to outpatient GI for further workup of iron  deficiency anemia  Home Health:No Equipment/Devices: None  Discharge Condition:Stable CODE STATUS:FULL Diet recommendation: Heart Healthy/Diabetic Fluid Restriction: None  Hospital Summary: HPI: Seth Guzman is a 78 y.o. male with medical history significant of  type 2 diabetes, hypertension, hyperlipidemia, prostate cancer, seizures, arthritis, anxiety/depression, gout, CAD, CHF, GERD presented to the ED via EMS for evaluation of altered mental status and decreased responsiveness.  History provided by the patient and his wife at bedside.  Wife states patient was feeling well this morning and they were about to go out of town.  On the way, they stopped at [facility] drive-through around 11 AM to get food when all of a sudden patient became unresponsive in the car and slumped over.  He was drenched in sweat and drooling.  Wife states she panicked and drove him back to their house which was about 5 minutes away and by then patient had regained consciousness.  Family called EMS.  Patient recalls having chest tightness at that time which has since resolved.  Denies dizziness or shortness of breath.  Denies nausea, vomiting, abdominal pain, or diarrhea.   ED Course: EMS gave him Narcan prior to arrival but there was minimal change in his mental status.  On arrival to the ED, patient was awake but initially slow to respond.  Afebrile.  Not tachycardic, hypotensive, or hypoxic.  Labs notable for WBC count 11.1, hemoglobin 12.2 (previously 15.7 on 01/25/2024), MCV 83.3,  potassium 3.4, bicarb 21, glucose 159, calcium  8.8, total protein 5.1, albumin 2.9, normal LFTs, ethanol level pending, troponin negative, magnesium within normal range, UA without signs of infection, UDS negative.  Chest x-ray showing no active disease.  CT head showing no acute intracranial abnormalities.  EKG showing sinus rhythm and no acute changes.  Patient was given 1 L normal saline.  Significant Events: Admitted 03/19/2024 for altered mental status   Admission Labs: WBC 11.1, HgB 12.2, plt 178 Na 137, K 3.4, CO2 of 21, BUN 11, Scr 0.95, glu 159 T prot 5.1, alb 2.9, AST 18, ALT 18, alk phos 59, T. Bili 0.6 Troponin 12 -> 11 UDS negative UA glu>500, ketones 5, otherwise negative Fe 34, TIBC 260, %sat 13, ferritin 216  Admission Imaging Studies: CXR No active disease.  CT head No acute intracranial abnormalities   Significant Labs: A1C 10.5%  Significant Imaging Studies: Echo LVEF 55-60%  Antibiotic Therapy: Anti-infectives (From admission, onward)    None       Procedures: EEG negative for seizures  Consultants:    Hospital Course by Problem: * Syncope 03-21-2024 Patient states that he and his wife are planning on going out of town yesterday.  He had gotten up as usual.  He took his medications in the morning which includes Amaryl.  He did not eat any breakfast.  It had been about 13 hours since his last meal.  Since they are planning on going out of town, he took his meds and planned on going to [employer] for breakfast.  While in line in the drive-through, he started feeling shaky.  He has  never had hypoglycemia before.  Wife states that patient was diaphoretic.  He did not have any chest pain.  He passed out for about 5 minutes.  EMS was called.  His reported blood sugar was 137 with EMS.  Patient has worked successfully with physical therapy and occupational Therapy.  He has no acute needs.  Telemetry shows no arrhythmias.  Echo shows normal LV function 55 to  60%.  No other abnormalities.  He does have some hypokinesis of the apical segments.  His CT head was negative for any abnormalities.  My suspicion was that he probably got hypoglycemic while waiting in line for food at [employer].  He had taken his oral diabetic medications but did not eat right away.  He did not feel any palpitations.  I recommended to him that he take his medications with meals or take his medications after his meals.  He is stable for discharge.  Type 2 diabetes mellitus (HCC) 03-21-2024 A1c of 10.5% pt referred back to Dr. Nichole for DM management. Pt would benefit from continuous glucose monitoring given suspicion for diabetic hypoglycemia yesterday.  Hypokalemia 03-21-2024 repleted and resolved. DC K of 3.7  Iron  deficiency anemia, unspecified 03-21-2024 pt will need referral to GI for his iron  deficiency anemia. Defer to PCP to make referral. DC to home with nuiron 150 mg daily x 30 days.  Iron /TIBC/Ferritin/ %Sat    Component Value Date/Time   IRON  34 (L) 03/19/2024 2202   TIBC 260 03/19/2024 2202   FERRITIN 216 03/19/2024 2202   IRONPCTSAT 13 (L) 03/19/2024 2202     HYPERTENSION, BENIGN 03-21-2024 stable.    Discharge Diagnoses:  Principal Problem:   Syncope Active Problems:   Type 2 diabetes mellitus (HCC)   HYPERTENSION, BENIGN   Iron  deficiency anemia, unspecified   Hypokalemia   Discharge Instructions  Discharge Instructions     Call MD for:  difficulty breathing, headache or visual disturbances   Complete by: As directed    Call MD for:  extreme fatigue   Complete by: As directed    Call MD for:  hives   Complete by: As directed    Call MD for:  persistant dizziness or light-headedness   Complete by: As directed    Call MD for:  persistant nausea and vomiting   Complete by: As directed    Call MD for:  redness, tenderness, or signs of infection (pain, swelling, redness, odor or green/yellow discharge around incision site)   Complete  by: As directed    Call MD for:  severe uncontrolled pain   Complete by: As directed    Call MD for:  temperature >100.4   Complete by: As directed    Diet - low sodium heart healthy   Complete by: As directed    Diet Carb Modified   Complete by: As directed    Discharge instructions   Complete by: As directed    1. Follow up with your primary care provider in 1-2 weeks following discharge from hospital.   Increase activity slowly   Complete by: As directed       Allergies as of 03/21/2024       Reactions   Vicodin [hydrocodone-acetaminophen ] Other (See Comments)   dizzy and sick feeling/Knocks him out        Medication List     TAKE these medications    amLODipine 5 MG tablet Commonly known as: NORVASC Take 5 mg by mouth daily.   aspirin  EC 81 MG tablet  Take 81 mg by mouth daily.   carvedilol  6.25 MG tablet Commonly known as: COREG  Take 6.25 mg by mouth 2 (two) times daily with a meal.   Coenzyme Q10 200 MG capsule Take 200 mg by mouth daily.   escitalopram 10 MG tablet Commonly known as: LEXAPRO Take 10 mg by mouth daily.   fish oil-omega-3 fatty acids 1000 MG capsule Take 1 g by mouth daily.   gabapentin 300 MG capsule Commonly known as: NEURONTIN Take 300 mg by mouth 3 (three) times daily.   glimepiride 4 MG tablet Commonly known as: AMARYL Take 4 mg by mouth 2 (two) times daily.   iron  polysaccharides 150 MG capsule Commonly known as: Nu-Iron  Take 1 capsule (150 mg total) by mouth daily.   isosorbide  mononitrate 60 MG 24 hr tablet Commonly known as: IMDUR  TAKE 1/2 TABLET BY MOUTH DAILY   multivitamin with minerals tablet Take 1 tablet by mouth daily.   naproxen sodium 220 MG tablet Commonly known as: ALEVE Take 440 mg by mouth 2 (two) times daily as needed (back pain).   nitroGLYCERIN  0.4 MG SL tablet Commonly known as: NITROSTAT  Place 1 tablet (0.4 mg total) under the tongue every 5 (five) minutes as needed for chest pain.    onetouch ultrasoft lancets Use as instructed   OneTouch Verio test strip Generic drug: glucose blood Use as instructed What changed: See the new instructions.   oxyCODONE  5 MG immediate release tablet Commonly known as: Oxy IR/ROXICODONE  Take 5 mg by mouth every 6 (six) hours as needed for severe pain (pain score 7-10).   predniSONE 5 MG tablet Commonly known as: DELTASONE 4 tablets once a day for 7 days, 3 tablets once a day for 7 days, 2 tablets once a day for 30 days Orally as directed for 44 days   rosuvastatin  20 MG tablet Commonly known as: CRESTOR  TAKE 1 TABLET(20 MG) BY MOUTH DAILY   Synjardy 12.12-998 MG Tabs Generic drug: Empagliflozin-metFORMIN HCl Take 1 tablet by mouth daily.        Allergies  Allergen Reactions   Vicodin [Hydrocodone-Acetaminophen ] Other (See Comments)    dizzy and sick feeling/Knocks him out    Discharge Exam: Vitals:   03/21/24 0457 03/21/24 0702  BP: 127/72 137/69  Pulse: 73 73  Resp: 18 18  Temp: 98.2 F (36.8 C) 98 F (36.7 C)  SpO2: 100% 100%    Physical Exam Vitals and nursing note reviewed.  Constitutional:      General: He is not in acute distress.    Appearance: He is normal weight. He is not toxic-appearing or diaphoretic.  HENT:     Head: Normocephalic and atraumatic.  Eyes:     General: No scleral icterus. Cardiovascular:     Rate and Rhythm: Normal rate and regular rhythm.  Pulmonary:     Effort: Pulmonary effort is normal.     Breath sounds: Normal breath sounds.  Abdominal:     General: Abdomen is flat. Bowel sounds are normal.     Palpations: Abdomen is soft.  Musculoskeletal:     Right lower leg: No edema.     Left lower leg: No edema.  Skin:    General: Skin is warm and dry.     Capillary Refill: Capillary refill takes less than 2 seconds.  Neurological:     General: No focal deficit present.     Mental Status: He is alert and oriented to person, place, and time.     The results  of  significant diagnostics from this hospitalization (including imaging, microbiology, ancillary and laboratory) are listed below for reference.     Labs:  Basic Metabolic Panel: Recent Labs  Lab 03/15/24 2056 03/15/24 2059 03/19/24 1259 03/19/24 1457 03/20/24 0541 03/21/24 0504  NA 131* 131* 137  --  138 134*  K 4.3 4.4 3.4*  --  4.0 3.7  CL 98  --  105  --  105 102  CO2 23  --  21*  --  23 23  GLUCOSE 466*  --  159*  --  103* 197*  BUN 16  --  11  --  11 11  CREATININE 0.95  --  0.95  --  0.79 0.72  CALCIUM  9.0  --  8.8*  --  8.8* 8.9  MG  --   --   --  1.7  --  1.7  PHOS  --   --   --   --   --  3.9   Liver Function Tests: Recent Labs  Lab 03/15/24 2056 03/19/24 1259 03/21/24 0504  AST 18 18 12*  ALT 29 18 16   ALKPHOS 101 59 58  BILITOT 0.7 0.6 0.5  PROT 5.9* 5.1* 5.2*  ALBUMIN 3.4* 2.9* 3.0*   CBC: Recent Labs  Lab 03/15/24 2056 03/15/24 2059 03/19/24 1259 03/20/24 0541 03/21/24 0504  WBC 8.7  --  11.1* 9.4 8.5  NEUTROABS 6.9  --  7.9*  --   --   HGB 12.8* 13.3 12.2* 12.6* 13.2  HCT 39.5 39.0 36.9* 38.2* 39.9  MCV 82.6  --  83.3 83.2 82.8  PLT 188  --  178 184 180   CBG: Recent Labs  Lab 03/20/24 1133 03/20/24 1643 03/20/24 2055 03/21/24 0701 03/21/24 1141  GLUCAP 217* 204* 166* 176* 220*   Hgb A1c Recent Labs    03/19/24 2202  HGBA1C 10.5*   Anemia work up Recent Labs    03/19/24 2202  FERRITIN 216  TIBC 260  IRON  34*   Urinalysis    Component Value Date/Time   COLORURINE YELLOW 03/19/2024 1635   APPEARANCEUR CLEAR 03/19/2024 1635   LABSPEC 1.024 03/19/2024 1635   PHURINE 7.0 03/19/2024 1635   GLUCOSEU >=500 (A) 03/19/2024 1635   HGBUR NEGATIVE 03/19/2024 1635   BILIRUBINUR NEGATIVE 03/19/2024 1635   KETONESUR 5 (A) 03/19/2024 1635   PROTEINUR NEGATIVE 03/19/2024 1635   UROBILINOGEN 1.0 02/12/2010 1731   NITRITE NEGATIVE 03/19/2024 1635   LEUKOCYTESUR NEGATIVE 03/19/2024 1635   Sepsis Labs Recent Labs  Lab 03/15/24 2056  03/19/24 1259 03/20/24 0541 03/21/24 0504  WBC 8.7 11.1* 9.4 8.5    Procedures/Studies: EEG adult Result Date: 03/20/2024 Shelton Arlin KIDD, MD     03/20/2024  3:41 PM Patient Name: DARUS HERSHMAN MRN: [id number] Epilepsy Attending: Arlin KIDD Shelton Referring Physician/Provider: Alfornia Madison, MD Date: 03/20/2024 Duration: 22.52 mins Patient history: 78yo M with ams/syncope. EEG to evaluate for seizure. Level of alertness: Awake, asleep AEDs during EEG study: None Technical aspects: This EEG study was done with scalp electrodes positioned according to the 10-20 International system of electrode placement. Electrical activity was reviewed with band pass filter of 1-70Hz , sensitivity of 7 uV/mm, display speed of 4mm/sec with a 60Hz  notched filter applied as appropriate. EEG data were recorded continuously and digitally stored.  Video monitoring was available and reviewed as appropriate. Description: The posterior dominant rhythm consists of 8-9 Hz activity of moderate voltage (25-35 uV) seen predominantly in posterior head regions, symmetric and  reactive to eye opening and eye closing. Sleep was characterized by vertex waves, sleep spindles (12 to 14 Hz), maximal frontocentral region.  Physiologic photic driving was not seen during photic stimulation.  Hyperventilation was not performed.   IMPRESSION: This study is within normal limits. No seizures or epileptiform discharges were seen throughout the recording. A normal interictal EEG does not exclude the diagnosis of epilepsy. Arlin MALVA Krebs   ECHOCARDIOGRAM COMPLETE Result Date: 03/20/2024    ECHOCARDIOGRAM REPORT   Patient Name:   INOCENCIO ROY Castelo Date of Exam: 03/20/2024 Medical Rec #:  [id number]     Height:       71.0 in Accession #:    [id number]    Weight:       146.2 lb Date of Birth:  03/24/1946     BSA:          1.845 m Patient Age:    77 years      BP:           134/71 mmHg Patient Gender: M             HR:           68 bpm. Exam Location:   Inpatient Procedure: 2D Echo, Cardiac Doppler and Color Doppler (Both Spectral and Color            Flow Doppler were utilized during procedure). Indications:    Syncope  History:        Patient has no prior history of Echocardiogram examinations.                 CHF, CAD, Signs/Symptoms:Syncope; Risk Factors:Diabetes.  Sonographer:    Hobert Dawn RDCS Referring Phys: [id number] VASUNDHRA RATHORE IMPRESSIONS  1. Left ventricular ejection fraction, by estimation, is 55 to 60%. The left ventricle has normal function. The left ventricle demonstrates regional wall motion abnormalities (see scoring diagram/findings for description). Left ventricular diastolic parameters were normal. There is mild hypokinesis of the left ventricular, entire apical segment.  2. Right ventricular systolic function is normal. The right ventricular size is normal. Tricuspid regurgitation signal is inadequate for assessing PA pressure.  3. The mitral valve is normal in structure. No evidence of mitral valve regurgitation. No evidence of mitral stenosis.  4. The aortic valve is normal in structure. Aortic valve regurgitation is not visualized. No aortic stenosis is present.  5. The inferior vena cava is dilated in size with >50% respiratory variability, suggesting right atrial pressure of 8 mmHg. FINDINGS  Left Ventricle: Left ventricular ejection fraction, by estimation, is 55 to 60%. The left ventricle has normal function. The left ventricle demonstrates regional wall motion abnormalities. Mild hypokinesis of the left ventricular, entire apical segment.  The left ventricular internal cavity size was normal in size. There is no left ventricular hypertrophy. Left ventricular diastolic parameters were normal.  LV Wall Scoring: The apical septal segment, apical anterior segment, apical inferior segment, and apex are hypokinetic. Right Ventricle: The right ventricular size is normal. No increase in right ventricular wall thickness. Right  ventricular systolic function is normal. Tricuspid regurgitation signal is inadequate for assessing PA pressure. Left Atrium: Left atrial size was normal in size. Right Atrium: Right atrial size was normal in size. Pericardium: There is no evidence of pericardial effusion. Mitral Valve: The mitral valve is normal in structure. No evidence of mitral valve regurgitation. No evidence of mitral valve stenosis. Tricuspid Valve: The tricuspid valve is normal in structure. Tricuspid valve regurgitation is trivial. No evidence  of tricuspid stenosis. Aortic Valve: The aortic valve is normal in structure. Aortic valve regurgitation is not visualized. No aortic stenosis is present. Aortic valve mean gradient measures 2.0 mmHg. Aortic valve peak gradient measures 3.5 mmHg. Aortic valve area, by VTI measures 3.34 cm. Pulmonic Valve: The pulmonic valve was not well visualized. Pulmonic valve regurgitation is not visualized. No evidence of pulmonic stenosis. Aorta: The aortic root is normal in size and structure. Venous: The inferior vena cava is dilated in size with greater than 50% respiratory variability, suggesting right atrial pressure of 8 mmHg. IAS/Shunts: No atrial level shunt detected by color flow Doppler.  LEFT VENTRICLE PLAX 2D LVIDd:         4.40 cm     Diastology LVIDs:         2.90 cm     LV e' medial:    8.45 cm/s LV PW:         0.90 cm     LV E/e' medial:  7.8 LV IVS:        1.10 cm     LV e' lateral:   8.58 cm/s LVOT diam:     2.10 cm     LV E/e' lateral: 7.7 LV SV:         68 LV SV Index:   37 LVOT Area:     3.46 cm  LV Volumes (MOD) LV vol d, MOD A2C: 66.4 ml LV vol d, MOD A4C: 83.5 ml LV vol s, MOD A2C: 25.3 ml LV vol s, MOD A4C: 30.6 ml LV SV MOD A2C:     41.1 ml LV SV MOD A4C:     83.5 ml LV SV MOD BP:      49.9 ml RIGHT VENTRICLE RV S prime:     12.20 cm/s TAPSE (M-mode): 1.9 cm LEFT ATRIUM             Index        RIGHT ATRIUM           Index LA Vol (A2C):   41.8 ml 22.65 ml/m  RA Area:     11.80 cm LA  Vol (A4C):   24.4 ml 13.22 ml/m  RA Volume:   24.10 ml  13.06 ml/m LA Biplane Vol: 32.8 ml 17.77 ml/m  AORTIC VALVE AV Area (Vmax):    3.47 cm AV Area (Vmean):   3.16 cm AV Area (VTI):     3.34 cm AV Vmax:           93.00 cm/s AV Vmean:          64.800 cm/s AV VTI:            0.202 m AV Peak Grad:      3.5 mmHg AV Mean Grad:      2.0 mmHg LVOT Vmax:         93.10 cm/s LVOT Vmean:        59.200 cm/s LVOT VTI:          0.195 m LVOT/AV VTI ratio: 0.97  AORTA Ao Root diam: 3.20 cm Ao Asc diam:  3.30 cm MITRAL VALVE MV Area (PHT): 3.48 cm    SHUNTS MV Decel Time: 218 msec    Systemic VTI:  0.20 m MV E velocity: 66.00 cm/s  Systemic Diam: 2.10 cm MV A velocity: 75.50 cm/s MV E/A ratio:  0.87 Mihai Croitoru MD Electronically signed by Jerel Balding MD Signature Date/Time: 03/20/2024/1:27:42 PM    Final    CT  Head Wo Contrast Result Date: 03/19/2024 CLINICAL DATA:  Mental status change of unknown cause. EXAM: CT HEAD WITHOUT CONTRAST TECHNIQUE: Contiguous axial images were obtained from the base of the skull through the vertex without intravenous contrast. RADIATION DOSE REDUCTION: This exam was performed according to the departmental dose-optimization program which includes automated exposure control, adjustment of the mA and/or kV according to patient size and/or use of iterative reconstruction technique. COMPARISON:  12/17/2021. FINDINGS: Brain: No evidence of acute infarction, hemorrhage, hydrocephalus, extra-axial collection or mass lesion/mass effect. Patchy white matter hypoattenuation is noted consistent with moderate to advanced chronic microvascular ischemic change, stable. Vascular: No hyperdense vessel or unexpected calcification. Skull: Normal. Negative for fracture or focal lesion. Sinuses/Orbits: Globes and orbits are unremarkable. Visualized sinuses are clear. Other: None. IMPRESSION: 1. No acute intracranial abnormalities. Electronically Signed   By: Alm Parkins M.D.   On: 03/19/2024 13:52    DG Chest Port 1 View Result Date: 03/19/2024 CLINICAL DATA:  Weakness. EXAM: PORTABLE CHEST 1 VIEW COMPARISON:  02/03/2020. FINDINGS: Cardiac silhouette is normal in size and configuration. Normal mediastinal and hilar contours. Clear lungs.  No pleural effusion or pneumothorax. Skeletal structures are grossly intact. IMPRESSION: No active disease. Electronically Signed   By: Alm Parkins M.D.   On: 03/19/2024 13:15    Time coordinating discharge: 55 mins  SIGNED:  Camellia Door, DO [facility] 03/21/24, 2:31 PM

## 2024-03-21 NOTE — Assessment & Plan Note (Signed)
 03-21-2024 A1c of 10.5% pt referred back to Dr. Nichole for DM management. Pt would benefit from continuous glucose monitoring given suspicion for diabetic hypoglycemia yesterday.

## 2024-03-21 NOTE — Plan of Care (Signed)

## 2024-03-21 NOTE — Progress Notes (Signed)
 DISCHARGE NOTE HOME Seth Guzman to be discharged Home per MD order. Discussed prescriptions and follow up appointments with the patient. Prescriptions given to patient; medication list explained in detail. Patient verbalized understanding.  Skin clean, dry and intact without evidence of skin break down, no evidence of skin tears noted. IV catheter discontinued intact. Site without signs and symptoms of complications. Dressing and pressure applied. Pt denies pain at the site currently. No complaints noted.  Patient free of lines, drains, and wounds.   An After Visit Summary (AVS) was printed and given to the patient. Patient escorted via wheelchair, and discharged home via private auto.  Doyal Sias, RN

## 2024-03-21 NOTE — Subjective & Objective (Addendum)
 Pt seen and examined. Met with pt and his delightful wife Almarie at bedside.  Patient states that he and his wife are planning on going out of town yesterday.  He had gotten up as usual.  He took his medications in the morning which includes Amaryl.  He did not eat any breakfast.  It had been about 13 hours since his last meal.  Since they are planning on going out of town, he took his meds and planned on going to [employer] for breakfast.  While in line in the drive-through, he started feeling shaky.  He has never had hypoglycemia before.  Wife states that patient was diaphoretic.  He did not have any chest pain.  He passed out for about 5 minutes.  EMS was called.  His reported blood sugar was 137 with EMS.  Patient has worked successfully with physical therapy and occupational Therapy.  He has no acute needs.  Telemetry shows no arrhythmias.  Echo shows normal LV function 55 to 60%.  No other abnormalities.  He does have some hypokinesis of the apical segments.  His CT head was negative for any abnormalities.  My suspicion was that he probably got hypoglycemic while waiting in line for food at [employer].  He had taken his oral diabetic medications but did not eat right away.  He did not feel any palpitations.  I recommended to him that he take his medications with meals or take his medications after his meals.  He is stable for discharge.

## 2024-03-21 NOTE — Evaluation (Addendum)
 Physical Therapy Evaluation and Discharge Patient Details Name: Seth Guzman MRN: [id number] DOB: October 19, 1945 Today's Date: 03/21/2024  History of Present Illness  Patient is a 78 year old male with AMS, decreased responsiveness. PMH:  type 2 diabetes, hypertension, hyperlipidemia, prostate cancer, seizures, arthritis, anxiety/depression, gout, CAD, CHF, GERD  Clinical Impression  Patient agreeable to PT evaluation. He reports using a cane intermittently for ambulation at baseline. He lives with his spouse. No recent falls reported.   Today the patient is likely at or nearing his baseline level of functional independence. He walked in the hallway without assistive device and no external support required. No reported dizziness with mobility. No further acute PT needs at this time. PT will sign off.       If plan is discharge home, recommend the following: Assist for transportation;Help with stairs or ramp for entrance   Can travel by private vehicle        Equipment Recommendations None recommended by PT  Recommendations for Other Services       Functional Status Assessment Patient has not had a recent decline in their functional status     Precautions / Restrictions Precautions Precautions: Fall Recall of Precautions/Restrictions: Intact Restrictions Weight Bearing Restrictions Per Provider Order: No      Mobility  Bed Mobility Overal bed mobility: Modified Independent             General bed mobility comments: increased time    Transfers Overall transfer level: Modified independent                 General transfer comment: increased time    Ambulation/Gait Ambulation/Gait assistance: Modified independent (Device/Increase time), Supervision Gait Distance (Feet): 260 Feet Assistive device: None Gait Pattern/deviations: Step-through pattern       General Gait Details: increased gait speed with increased ambulation distance. supervision initially  progressing to Mod I. no loss of balance. no dizziness reported with mobility  Stairs            Wheelchair Mobility     Tilt Bed    Modified Rankin (Stroke Patients Only)       Balance Overall balance assessment: Needs assistance Sitting-balance support: Feet supported Sitting balance-Leahy Scale: Good     Standing balance support: No upper extremity supported Standing balance-Leahy Scale: Fair Standing balance comment: no loss of balance with ambulation with head turns and distractions in hallway                             Pertinent Vitals/Pain Pain Assessment Pain Assessment: No/denies pain    Home Living Family/patient expects to be discharged to:: Private residence Living Arrangements: Spouse/significant other Available Help at Discharge: Family Type of Home: House Home Access: Stairs to enter   Secretary/administrator of Steps: 3   Home Layout:  (split level with a few steps required) Home Equipment: Cane - single point      Prior Function Prior Level of Function : Independent/Modified Independent             Mobility Comments: intermittent use of cane. no falls reported ADLs Comments: independent     Extremity/Trunk Assessment   Upper Extremity Assessment Upper Extremity Assessment: Overall WFL for tasks assessed    Lower Extremity Assessment Lower Extremity Assessment: Overall WFL for tasks assessed       Communication   Communication Communication: No apparent difficulties    Cognition Arousal: Alert Behavior During Therapy: [facility] for  tasks assessed/performed   PT - Cognitive impairments: No apparent impairments                         Following commands: Intact       Cueing Cueing Techniques: Verbal cues     General Comments General comments (skin integrity, edema, etc.): educated patient to monitor for signs of dizziness or vision changes (he reported vision changes prior to the out of hospital  syncopal episode) for fall prevention in the home and community setting.    Exercises     Assessment/Plan    PT Assessment Patient does not need any further PT services  PT Problem List         PT Treatment Interventions      PT Goals (Current goals can be found in the Care Plan section)  Acute Rehab PT Goals Patient Stated Goal: home today PT Goal Formulation: All assessment and education complete, DC therapy    Frequency       Co-evaluation               AM-PAC PT 6 Clicks Mobility  Outcome Measure Help needed turning from your back to your side while in a flat bed without using bedrails?: None Help needed moving from lying on your back to sitting on the side of a flat bed without using bedrails?: None Help needed moving to and from a bed to a chair (including a wheelchair)?: None Help needed standing up from a chair using your arms (e.g., wheelchair or bedside chair)?: None Help needed to walk in hospital room?: None Help needed climbing 3-5 steps with a railing? : A Little 6 Click Score: 23    End of Session Equipment Utilized During Treatment: Gait belt Activity Tolerance: Patient tolerated treatment well Patient left: in bed;with call bell/phone within reach;with family/visitor present (seated on edge of bed) Nurse Communication: Mobility status PT Visit Diagnosis: Muscle weakness (generalized) (M62.81)    Time: 9077-9060 PT Time Calculation (min) (ACUTE ONLY): 17 min   Charges:   PT Evaluation $PT Eval Low Complexity: 1 Low   PT General Charges $$ ACUTE PT VISIT: 1 Visit        Randine Essex, PT, MPT    Randine LULLA Essex 03/21/2024, 9:49 AM

## 2024-03-21 NOTE — Assessment & Plan Note (Signed)
 03-21-2024 repleted and resolved. DC K of 3.7

## 2024-03-21 NOTE — Hospital Course (Signed)
 HPI: Seth Guzman is a 78 y.o. male with medical history significant of  type 2 diabetes, hypertension, hyperlipidemia, prostate cancer, seizures, arthritis, anxiety/depression, gout, CAD, CHF, GERD presented to the ED via EMS for evaluation of altered mental status and decreased responsiveness.  History provided by the patient and his wife at bedside.  Wife states patient was feeling well this morning and they were about to go out of town.  On the way, they stopped at [employer] [facility] drive-through around 11 AM to get food when all of a sudden patient became unresponsive in the car and slumped over.  He was drenched in sweat and drooling.  Wife states she panicked and drove him back to their house which was about 5 minutes away and by then patient had regained consciousness.  Family called EMS.  Patient recalls having chest tightness at that time which has since resolved.  Denies dizziness or shortness of breath.  Denies nausea, vomiting, abdominal pain, or diarrhea.   ED Course: EMS gave him Narcan prior to arrival but there was minimal change in his mental status.  On arrival to the ED, patient was awake but initially slow to respond.  Afebrile.  Not tachycardic, hypotensive, or hypoxic.  Labs notable for WBC count 11.1, hemoglobin 12.2 (previously 15.7 on 01/25/2024), MCV 83.3, potassium 3.4, bicarb 21, glucose 159, calcium  8.8, total protein 5.1, albumin 2.9, normal LFTs, ethanol level pending, troponin negative, magnesium within normal range, UA without signs of infection, UDS negative.  Chest x-ray showing no active disease.  CT head showing no acute intracranial abnormalities.  EKG showing sinus rhythm and no acute changes.  Patient was given 1 L normal saline.  Significant Events: Admitted 03/19/2024 for altered mental status   Admission Labs: WBC 11.1, HgB 12.2, plt 178 Na 137, K 3.4, CO2 of 21, BUN 11, Scr 0.95, glu 159 T prot 5.1, alb 2.9, AST 18, ALT 18, alk phos 59, T. Bili 0.6 Troponin 12 ->  11 UDS negative UA glu>500, ketones 5, otherwise negative Fe 34, TIBC 260, %sat 13, ferritin 216  Admission Imaging Studies: CXR No active disease.  CT head No acute intracranial abnormalities   Significant Labs: A1C 10.5%  Significant Imaging Studies: Echo LVEF 55-60%  Antibiotic Therapy: Anti-infectives (From admission, onward)    None       Procedures: EEG negative for seizures  Consultants:

## 2024-03-21 NOTE — Assessment & Plan Note (Signed)
 03-21-2024 Patient states that he and his wife are planning on going out of town yesterday.  He had gotten up as usual.  He took his medications in the morning which includes Amaryl.  He did not eat any breakfast.  It had been about 13 hours since his last meal.  Since they are planning on going out of town, he took his meds and planned on going to [employer] for breakfast.  While in line in the drive-through, he started feeling shaky.  He has never had hypoglycemia before.  Wife states that patient was diaphoretic.  He did not have any chest pain.  He passed out for about 5 minutes.  EMS was called.  His reported blood sugar was 137 with EMS.  Patient has worked successfully with physical therapy and occupational Therapy.  He has no acute needs.  Telemetry shows no arrhythmias.  Echo shows normal LV function 55 to 60%.  No other abnormalities.  He does have some hypokinesis of the apical segments.  His CT head was negative for any abnormalities.  My suspicion was that he probably got hypoglycemic while waiting in line for food at [employer].  He had taken his oral diabetic medications but did not eat right away.  He did not feel any palpitations.  I recommended to him that he take his medications with meals or take his medications after his meals.  He is stable for discharge.

## 2024-03-24 ENCOUNTER — Other Ambulatory Visit (HOSPITAL_BASED_OUTPATIENT_CLINIC_OR_DEPARTMENT_OTHER): Payer: Self-pay | Admitting: Family

## 2024-06-23 ENCOUNTER — Emergency Department (HOSPITAL_COMMUNITY)
Admission: EM | Admit: 2024-06-23 | Discharge: 2024-06-23 | Disposition: A | Discharge status: Home / Self Care | Attending: Student in an Organized Health Care Education/Training Program | Admitting: Student in an Organized Health Care Education/Training Program

## 2024-06-23 ENCOUNTER — Emergency Department (HOSPITAL_COMMUNITY)

## 2024-06-23 ENCOUNTER — Encounter (HOSPITAL_COMMUNITY): Payer: Self-pay

## 2024-06-23 ENCOUNTER — Other Ambulatory Visit: Payer: Self-pay

## 2024-06-23 DIAGNOSIS — Z7982 Long term (current) use of aspirin: Secondary | ICD-10-CM | POA: Diagnosis not present

## 2024-06-23 DIAGNOSIS — Z8616 Personal history of COVID-19: Secondary | ICD-10-CM | POA: Insufficient documentation

## 2024-06-23 DIAGNOSIS — E119 Type 2 diabetes mellitus without complications: Secondary | ICD-10-CM | POA: Diagnosis not present

## 2024-06-23 DIAGNOSIS — R251 Tremor, unspecified: Secondary | ICD-10-CM | POA: Diagnosis not present

## 2024-06-23 DIAGNOSIS — Z7984 Long term (current) use of oral hypoglycemic drugs: Secondary | ICD-10-CM | POA: Diagnosis not present

## 2024-06-23 DIAGNOSIS — R001 Bradycardia, unspecified: Secondary | ICD-10-CM | POA: Insufficient documentation

## 2024-06-23 DIAGNOSIS — Z79899 Other long term (current) drug therapy: Secondary | ICD-10-CM | POA: Insufficient documentation

## 2024-06-23 DIAGNOSIS — I10 Essential (primary) hypertension: Secondary | ICD-10-CM | POA: Diagnosis not present

## 2024-06-23 DIAGNOSIS — R55 Syncope and collapse: Secondary | ICD-10-CM | POA: Diagnosis present

## 2024-06-23 LAB — CBC WITH DIFFERENTIAL/PLATELET
Abs Immature Granulocytes: 0.03 K/uL (ref 0.00–0.07)
Basophils Absolute: 0 K/uL (ref 0.0–0.1)
Basophils Relative: 0 %
Eosinophils Absolute: 0 K/uL (ref 0.0–0.5)
Eosinophils Relative: 0 %
HCT: 38.7 % — ABNORMAL LOW (ref 39.0–52.0)
Hemoglobin: 12.2 g/dL — ABNORMAL LOW (ref 13.0–17.0)
Immature Granulocytes: 0 %
Lymphocytes Relative: 15 %
Lymphs Abs: 1.4 K/uL (ref 0.7–4.0)
MCH: 27.4 pg (ref 26.0–34.0)
MCHC: 31.5 g/dL (ref 30.0–36.0)
MCV: 87 fL (ref 80.0–100.0)
Monocytes Absolute: 0.6 K/uL (ref 0.1–1.0)
Monocytes Relative: 6 %
Neutro Abs: 7.5 K/uL (ref 1.7–7.7)
Neutrophils Relative %: 79 %
Platelets: 166 K/uL (ref 150–400)
RBC: 4.45 MIL/uL (ref 4.22–5.81)
RDW: 13.5 % (ref 11.5–15.5)
WBC: 9.7 K/uL (ref 4.0–10.5)
nRBC: 0 % (ref 0.0–0.2)

## 2024-06-23 LAB — COMPREHENSIVE METABOLIC PANEL WITH GFR
ALT: 32 U/L (ref 0–44)
AST: 20 U/L (ref 15–41)
Albumin: 2.8 g/dL — ABNORMAL LOW (ref 3.5–5.0)
Alkaline Phosphatase: 59 U/L (ref 38–126)
Anion gap: 7 (ref 5–15)
BUN: 11 mg/dL (ref 8–23)
CO2: 21 mmol/L — ABNORMAL LOW (ref 22–32)
Calcium: 7.4 mg/dL — ABNORMAL LOW (ref 8.9–10.3)
Chloride: 108 mmol/L (ref 98–111)
Creatinine, Ser: 0.85 mg/dL (ref 0.61–1.24)
GFR, Estimated: >60 mL/min (ref >60)
Glucose, Bld: 295 mg/dL — ABNORMAL HIGH (ref 70–99)
Potassium: 3.4 mmol/L — ABNORMAL LOW (ref 3.5–5.1)
Sodium: 136 mmol/L (ref 135–145)
Total Bilirubin: 0.6 mg/dL (ref 0.0–1.2)
Total Protein: 4.6 g/dL — ABNORMAL LOW (ref 6.5–8.1)

## 2024-06-23 LAB — ETHANOL: Alcohol, Ethyl (B): <15 mg/dL (ref <15)

## 2024-06-23 LAB — CBG MONITORING, ED: Glucose-Capillary: 263 mg/dL — ABNORMAL HIGH (ref 70–99)

## 2024-06-23 LAB — TROPONIN I (HIGH SENSITIVITY)
Troponin I (High Sensitivity): 6 ng/L (ref <18)
Troponin I (High Sensitivity): 6 ng/L (ref <18)

## 2024-06-23 MED ORDER — ONDANSETRON HCL 4 MG/2ML IJ SOLN: 4.0000 mg | Freq: Once | INTRAMUSCULAR | Status: DC

## 2024-06-23 MED ORDER — LACTATED RINGERS IV BOLUS
400.0000 mL | Freq: Once | INTRAVENOUS | Status: AC
  Administered 2024-06-23: 400 mL via INTRAVENOUS

## 2024-06-23 NOTE — Discharge Instructions (Addendum)
 You were seen in the ED for a fainting spell.  We did an extensive workup.  Your heart is beating slightly slowly and has a slight block in electrical signal conduction, but this is not severe.  There were no other abnormalities on our testing.  We gave some fluids and you felt better with rest.  We suspect you had a vasovagal syncope, which is a form of loss of consciousness caused by stimulation of the nervous system and a specific nerve that connects to your heart and brain.  This can rapidly cause your blood pressure to drop.  To help prevent this from happening, please make an effort to hydrate well, stand up slowly from sitting down, and avoid stress if possible.  Please follow-up with your primary care doctor to discuss this event and continue taking your medications as prescribed.

## 2024-06-23 NOTE — ED Triage Notes (Signed)
 Pt BIB [facility] from restaurant for syncopal episode. Pt was at restaurant with family, began to feel weird and like vision was going black, then had 1 minute witnessed syncopal episode. No trauma, no thinners. Initial BP from fire 90/58, latest EMS BP 114/70 after 600 mLs NS. EMS found pt to be in 1st degree HB with lowest HR of 50, no known hx. Pt was pale and diaphoretic on EMS arrival. Pt had 3 episodes of emesis en route. All other VSS per EMS, Aox4.

## 2024-06-23 NOTE — ED Provider Notes (Signed)
 [place] EMERGENCY DEPARTMENT AT [facility] Provider Note   CSN: [id number] Arrival date & time: 06/23/24  1254    Patient presents with: Loss of Consciousness  Seth Guzman is a 78 y.o. male.   Seth Guzman is an 78 year old male with pertinent PMH of polymyalgia rheumatica, some small vessel coronary artery blockage, T2DM, and HTN arriving via EMS from a restaurant after syncopal episode.  Today, patient was eating at a restaurant with family, was staring into the distance when he slumped over in his chair suddenly.  He was unconscious for a few minutes and reportedly very diaphoretic during that time.  Patient does not remember losing consciousness or the events immediately prior. Patient was not shaking during LOC.  Did not lose control of bowel  Patient did not hit head or fall, no physical trauma experienced. Patient does not take any blood thinners. Patient has previously collapsed, most recently in July 2025 with similar ED presentation at time. Had a single seizure in 2008 per chart review. No history of arrhythmias known.  Of note, prior EKG in July 2025 with 1st degree HB as well. Had COVID about 1 month ago.  Earlier today, prior to [employer], wife reports that patient seemed to be phasing out and staring off into the distance more than normal while getting dressed.  She asked him if he wanted her to call PCP, but he declined.  Patient seemed to be slow to respond to prompts.  Wife endorses a few of these staring episodes over the past 2 weeks.  Takes prednisone daily for 6 months for polymyalgia, currently weaning but dose stable for past few weeks. Took his medicines this AM, including BP meds and glimepiride.  On EMS arrival, patient was reportedly pale and diaphoretic.  En route per EMS, BP was 90/58 and patient received NS bolus x600 mL, with BP improved to 114/70.  EMS EKG reportedly with first-degree heart block and bradycardia to 50.  Had 3 episodes of  emesis en route.     Loss of Consciousness Associated symptoms: nausea and vomiting   Associated symptoms: no chest pain, no dizziness, no fever, no headaches, no palpitations, no seizures, no shortness of breath and no weakness      Prior to Admission medications   Medication Sig Start Date End Date Taking? Authorizing Provider  amLODipine (NORVASC) 5 MG tablet Take 5 mg by mouth daily.    [provider]  aspirin  EC 81 MG tablet Take 81 mg by mouth daily.    [provider]  carvedilol  (COREG ) 6.25 MG tablet Take 6.25 mg by mouth 2 (two) times daily with a meal.     [provider]  Coenzyme Q10 200 MG capsule Take 200 mg by mouth daily.    [provider]  Empagliflozin-metFORMIN HCl (SYNJARDY) 12.12-998 MG TABS Take 1 tablet by mouth daily.    [provider]  escitalopram (LEXAPRO) 10 MG tablet Take 10 mg by mouth daily.    [provider]  fish oil-omega-3 fatty acids 1000 MG capsule Take 1 g by mouth daily.     [provider]  gabapentin (NEURONTIN) 300 MG capsule Take 300 mg by mouth 3 (three) times daily.    [provider]  glimepiride (AMARYL) 4 MG tablet Take 4 mg by mouth 2 (two) times daily.     [provider]  glucose blood (ONETOUCH VERIO) test strip Use as instructed 03/21/24   Laurence Locus, DO  iron   polysaccharides (NU-IRON ) 150 MG capsule Take 1 capsule (150 mg total) by mouth daily. 03/21/24 04/20/24  Laurence Locus, DO  isosorbide  mononitrate (IMDUR ) 60 MG 24 hr tablet TAKE ONE-HALF TABLET BY MOUTH EVERY DAY. 03/24/24   Verlin Lonni BIRCH, MD  Lancets [facility] ULTRASOFT) lancets Use as instructed 03/21/24   Laurence Locus, DO  Multiple Vitamins-Minerals (MULTIVITAMIN WITH MINERALS) tablet Take 1 tablet by mouth daily.    [provider]  naproxen sodium (ANAPROX) 220 MG tablet Take 440 mg by mouth 2 (two) times daily as needed (back pain).    [provider]  nitroGLYCERIN   (NITROSTAT ) 0.4 MG SL tablet Place 1 tablet (0.4 mg total) under the tongue every 5 (five) minutes as needed for chest pain. Patient not taking: Reported on 03/19/2024 10/03/14   Verlin Lonni BIRCH, MD  oxyCODONE  (OXY IR/ROXICODONE ) 5 MG immediate release tablet Take 5 mg by mouth every 6 (six) hours as needed for severe pain (pain score 7-10).    [provider]  predniSONE (DELTASONE) 5 MG tablet 4 tablets once a day for 7 days, 3 tablets once a day for 7 days, 2 tablets once a day for 30 days Orally as directed for 44 days 02/10/24   [provider]  rosuvastatin  (CRESTOR ) 20 MG tablet TAKE 1 TABLET(20 MG) BY MOUTH DAILY 02/15/24   Verlin Lonni BIRCH, MD    Allergies: Vicodin [hydrocodone-acetaminophen ]    Review of Systems  Constitutional:  Negative for chills and fever.  HENT:  Negative for ear pain and sore throat.   Eyes:  Negative for pain and visual disturbance.  Respiratory:  Negative for cough and shortness of breath.   Cardiovascular:  Positive for syncope. Negative for chest pain and palpitations.  Gastrointestinal:  Positive for nausea and vomiting. Negative for abdominal pain.  Genitourinary:  Negative for decreased urine volume, dysuria, flank pain and hematuria.  Musculoskeletal:  Negative for arthralgias and back pain.  Skin:  Negative for color change and rash.  Neurological:  Negative for dizziness, seizures, syncope, facial asymmetry, weakness, light-headedness, numbness and headaches.  All other systems reviewed and are negative.   Updated Vital Signs Ht 5' 11 (1.803 m)   Wt 68 kg   BMI 20.92 kg/m   Physical Exam Vitals and nursing note reviewed.  Constitutional:      General: He is not in acute distress.    Appearance: He is well-developed. He is not diaphoretic.     Comments: Tired-appearing  HENT:     Head: Normocephalic and atraumatic.     Nose: No congestion or rhinorrhea.     Mouth/Throat:     Mouth: Mucous membranes are  moist.     Pharynx: Oropharynx is clear.  Eyes:     General: No visual field deficit.    Conjunctiva/sclera: Conjunctivae normal.  Cardiovascular:     Rate and Rhythm: Regular rhythm. Bradycardia present.     Heart sounds: No murmur heard. Pulmonary:     Effort: Pulmonary effort is normal. No respiratory distress.     Breath sounds: Normal breath sounds.  Chest:     Chest wall: No tenderness.  Abdominal:     Palpations: Abdomen is soft.     Tenderness: There is no abdominal tenderness.  Musculoskeletal:        General: No swelling.     Cervical back: Neck supple.  Skin:    General: Skin is warm and dry.     Capillary Refill: Capillary refill takes 2 to 3  seconds.     Coloration: Skin is pale.  Neurological:     Mental Status: He is alert and oriented to person, place, and time. Mental status is at baseline.     Cranial Nerves: Cranial nerves 2-12 are intact. No cranial nerve deficit, dysarthria or facial asymmetry.     Motor: Tremor (mild tremor of BUE) present. No weakness.     Coordination: Finger-Nose-Finger Test and Heel to [employer] normal.  Psychiatric:        Mood and Affect: Mood normal.     (all labs ordered are listed, but only abnormal results are displayed) Labs Reviewed - No data to display  EKG: None  Radiology: No results found.   Procedures   Medications Ordered in the ED - No data to display                                  Medical Decision Making Janet Decesare is an 78 yo male with PMH of remote seizure, T2DM, HTN, and syncope presenting after witnessed syncopal episode.  Initially considered vasovagal syncope, orthostatic syncope, polypharmacy, ACS, seizure, stroke, hypoglycemia, arrhythmia, or electrolyte abnormality.  While EKG does show first-degree heart block, would not expect this to be symptomatic.  Given prodrome of diaphoresis and hypotension, vasovagal syncope remains a high consideration.  Dehydration also possible, especially given  hypotension response to fluids.  Patient does have a documented history of single seizure in 2008, but this episode did not involve any stereotypic shaking, loss of bowel/bladder control, biting of tongue, or true postictal period.  While patient has had a few recent staring off episodes, low concern procedure at this time.  Neurological exam is unremarkable, no facial droop or dysarthria, no concern for stroke.  CT head was unremarkable at prior presentation 3 months ago in July 2025.  Did not pursue further head imaging given no head injury or fall, rapid return to neurological baseline.  CMP and CBC showed borderline normal electrolytes and borderline anemia, seen on prior CBC in the past few months.  Ethanol WNL.  Orthostatic vitals negative.  CBG moderately elevated, likely secondary to prednisone and no concern for hypoglycemic episode.  CXR unremarkable.  Admit started 400 mL bolus in addition to 600 mL via EMS.  Emesis/nausea resolved with Zofran .  On reevaluation, patient had returned to baseline and stated he was feeling better.  Favor vasovagal syncope as most likely etiology.  Discussed plan with family and agreeable to discharge.  Encouraged adequate hydration, recommended follow-up with PCP, provided return precautions.  Amount and/or Complexity of Data Reviewed Labs: ordered. Decision-making details documented in ED Course. Radiology: ordered. ECG/medicine tests: ordered and independent interpretation performed. Decision-making details documented in ED Course.  Risk Prescription drug management.      Final diagnoses:  None    ED Discharge Orders     None        Nikisha Fleece, MD 06/23/24 1621    Lowther, Amy, DO 06/23/24 1627

## 2024-06-30 ENCOUNTER — Encounter: Payer: Self-pay | Admitting: Neurology

## 2024-07-06 ENCOUNTER — Encounter: Admitting: Internal Medicine

## 2024-08-17 ENCOUNTER — Ambulatory Visit: Admitting: Neurology

## 2024-08-17 ENCOUNTER — Other Ambulatory Visit: Payer: Self-pay | Admitting: Neurology

## 2024-08-17 ENCOUNTER — Encounter: Payer: Self-pay | Admitting: Neurology

## 2024-08-17 VITALS — BP 146/74 | HR 68 | Ht 71.0 in | Wt 175.0 lb

## 2024-08-17 DIAGNOSIS — R55 Syncope and collapse: Secondary | ICD-10-CM

## 2024-08-17 DIAGNOSIS — R404 Transient alteration of awareness: Secondary | ICD-10-CM

## 2024-08-17 NOTE — Progress Notes (Signed)
 NEUROLOGY CONSULTATION NOTE  Seth Guzman MRN: [id number] DOB: 1946-04-27  Referring provider: Dr. Garnette Ore Primary care provider: Dr. Garnette Ore  Reason for consult:  recurrent syncope  Dear Dr Ore:  Thank you for your kind referral of Seth Guzman for consultation of the above symptoms. Although his history is well known to you, please allow me to reiterate it for the purpose of our medical record. The patient was accompanied to the clinic by his wife Vertell who also provides collateral information. Records and images were personally reviewed where available.  Discussed the use of AI scribe software for clinical note transcription with the patient, who gave verbal consent to proceed.  History of Present Illness This is a very pleasant 78 year old right-handed man with a history of hypertension, hyperlipidemia, DM2, prostate cancer, PMR, anxiety, presenting for evaluation of recurrent syncope. The first episode occurred 7 years ago, he was sitting at an event when he saw a swirly thing in his vision then he woke up still sitting on the chair, with urinary incontinence. He denied feeling dizziness or any other symptoms aside from the vision changes. His wife recalls he just slumped down and was out briefly, however he was incoherent afterwards. He was brought to [facility] and had an evaluation. The next episode occurred on 03/19/24, he was a passenger with his wife driving. He recalls his vision started to blur, the printed letters looked like they were getting bigger, then he has no recollection of events. His wife reports he just slumped down, he was out briefly, she panicked and drove home where she alerted family and EMS. She recalls he was sweating profusely, he was again incoherent afterwards. No tongue bite or incontinence. He was admitted for 2 days, bloodwork unremarkable. Head CT no acute changes, EEG normal. Echocardiogram showed an EF 55-60%, mild hypokinesis of the left  ventricular, entire apical segment. Episode felt due to hypoglycemia. The last episode was on 06/23/24 while at a restaurant after finishing his meal. There was no warning symptoms, his daughter witnessed him slump over and held him up to prevent falling to the floor. ER notes indicate he was very diaphoretic, no tongue bite or incontinence. He was again incoherent for a while even in the ER. Bloodwork unremarkable. EKG showed first degree heart block.   No further syncopal episodes since October, however his wife reports that over the past few months, he would have episodes where he 'just freezes' and loses focus. For instance, he would be getting dressed then just stops briefly. He notices himself that he would be staring at something or that he would freeze, he feels like he can talk and understand but has not tried to move during them. They are not frequent, last episode was a month ago. He denies any olfactory/gustatory hallucinations, rising epigastric sensation, focal numbness/tingling/weakness, myoclonic jerks. He reports a couple of instances 15 years ago where he would be driving and would not know where he was or which way to go. His wife has noticed this as well, he would be driving and would not go where he is supposed to, she picks up right away and corrects him, and he responds and corrects himself. He denies getting lost driving. Memory is not good. He forgets to take his medications on time, his wife reminds him by lunch or dinner time. She has always managed finances. His 2 siblings have dementia.   He denies any headaches, dizziness, diplopia, dysarthria, dysphagia, neck/back pain, bowel/bladder  dysfunction. He has numbness in both hands from the PMR. He has balance issues and was using a cane but has started walking better without it. Last fall was 1.5 months ago. He gets 2-6 hours of sleep, he reports sleep interruption to urinate or 'just can't sleep.' He has been taking Gabapentin 300mg   TID for pain. He is on Lexapro for anxiety, mood is fine 95% of the time, sometimes he feels real sad. He drinks hard liquor on the weekends (2-3 mixed drinks) and sweet wine 'often.' His sister used to have blackouts in childhood. He had a normal birth and early development.  There is no history of febrile convulsions, CNS infections such as meningitis/encephalitis, significant traumatic brain injury, neurosurgical procedures, or family history of seizures.     PAST MEDICAL HISTORY: Past Medical History:  Diagnosis Date   Anxiety    Arthritis    CAD in native artery    a. cath 2006 - mod CAD, med rx. b. cath 2016 -  20% prox-mid RCA, 30% ost-prox LAD, 30% mLAd, 99% distal LAD-2, 90% distal LAD-1, 50% D2, 70% D3 -> the LAD disease was small caliber/apical and too small for PCI. EF was 45-50%   Complication of anesthesia    slow to wake up after hernia surgery at the surgical center   Depression    Diabetes mellitus type 2 in nonobese [facility])    GERD (gastroesophageal reflux disease)    Gout    Heart murmur    HTN (hypertension)    Hx of radiation therapy 01/08/14- 03/01/14   prostate 7600 cGy 38 sessions   Hyperlipidemia    Inguinal hernia    recurrent right inguinal hernia   Ischemic cardiomyopathy    a. EF 45-50% by cath 2016.   Meatal stenosis 1966, 1967   Pneumonia    x 1   Prostate cancer (HCC) 08/10/2013   Gleason 6, volume 28.67 cc   Seizures (HCC)    x 1 - yrs ago per patient    PAST SURGICAL HISTORY: Past Surgical History:  Procedure Laterality Date   CARDIAC CATHETERIZATION N/A 01/14/2015   Procedure: Left Heart Cath and Coronary Angiography;  Surgeon: Lonni JONETTA Cash, MD;  Location: [facility] INVASIVE CV LAB;  Service: Cardiovascular;  Laterality: N/A;   CARPAL TUNNEL WITH CUBITAL TUNNEL Right 01/25/2024   Procedure: RIGHT CARPAL TUNNEL AND CUBITAL TUNNEL RELEASE AND REPAIR AS NECESSARY;  Surgeon: Camella Fallow, MD;  Location: [facility] OR;  Service: Orthopedics;  Laterality:  Right;  Right carpal tunnel release.  Right ulnar nerve decompression in situ at the elbow.   HAND SURGERY     INGUINAL HERNIA REPAIR  1966, 1967   bilateral herniorrphoes   LEFT HEART CATH AND CORONARY ANGIOGRAPHY N/A 08/14/2019   Procedure: LEFT HEART CATH AND CORONARY ANGIOGRAPHY;  Surgeon: Cash Lonni JONETTA, MD;  Location: [facility] INVASIVE CV LAB;  Service: Cardiovascular;  Laterality: N/A;   PROSTATE BIOPSY  08/10/13   gleason 3+3=6, vol 28.67 cc    MEDICATIONS: Medications Ordered Prior to Encounter[1]  ALLERGIES: Allergies[2]  FAMILY HISTORY: Family History  Problem Relation Age of Onset   Diabetes Mother    Stroke Mother    Diabetes Sister    Heart attack Sister    Diabetes Brother    Cancer Father        larynx   Stroke Brother     SOCIAL HISTORY: Social History   Socioeconomic History   Marital status: Married    Spouse name: Not  on file   Number of children: Not on file   Years of education: Not on file   Highest education level: Not on file  Occupational History   Not on file  Tobacco Use   Smoking status: Never   Smokeless tobacco: Never  Vaping Use   Vaping status: Never Used  Substance and Sexual Activity   Alcohol use: Yes    Comment: ocasionally   Drug use: No   Sexual activity: Not Currently  Other Topics Concern   Not on file  Social History Narrative   Are you right handed or left handed? Right handed    Are you currently employed ? No    What is your current occupation?   Do you live at home alone?  No    Who lives with you? Wife   What type of home do you live in: 1 story or 2 story? 2 level        Social Drivers of Health   Tobacco Use: Low Risk (08/17/2024)   Patient History    Smoking Tobacco Use: Never    Smokeless Tobacco Use: Never    Passive Exposure: Not on file  Financial Resource Strain: Not on file  Food Insecurity: No Food Insecurity (03/20/2024)   Epic    Worried About Programme Researcher, Broadcasting/film/video in the Last Year: Never  true    Ran Out of Food in the Last Year: Never true  Transportation Needs: No Transportation Needs (03/20/2024)   Epic    Lack of Transportation (Medical): No    Lack of Transportation (Non-Medical): No  Physical Activity: Not on file  Stress: Not on file  Social Connections: Unknown (03/20/2024)   Social Connection and Isolation Panel    Frequency of Communication with Friends and Family: Patient declined    Frequency of Social Gatherings with Friends and Family: Patient declined    Attends Religious Services: Patient declined    Database Administrator or Organizations: Patient declined    Attends Banker Meetings: Patient declined    Marital Status: Married  Catering Manager Violence: Not At Risk (03/20/2024)   Epic    Fear of Current or Ex-Partner: No    Emotionally Abused: No    Physically Abused: No    Sexually Abused: No  Depression (PHQ2-9): Not on file  Alcohol Screen: Not on file  Housing: Low Risk (03/20/2024)   Epic    Unable to Pay for Housing in the Last Year: No    Number of Times Moved in the Last Year: 0    Homeless in the Last Year: No  Utilities: Not At Risk (03/20/2024)   Epic    Threatened with loss of utilities: No  Health Literacy: Not on file     PHYSICAL EXAM: Vitals:   08/17/24 0837 08/17/24 0842  BP: (!) 151/72 (!) 146/74  Pulse: 68   SpO2: 99%    General: No acute distress Head:  Normocephalic/atraumatic Skin/Extremities: No rash, no edema Neurological Exam: Mental status: alert and oriented to person, place, and time, no dysarthria or aphasia, Fund of knowledge is appropriate.  Recent and remote memory are intact.  Attention and concentration are normal.    Able to name objects and repeat phrases. MMSE 29/30    08/17/2024    9:00 AM  MMSE - Mini Mental State Exam  Orientation to time 5  Orientation to Place 5  Registration 3  Attention/ Calculation 5  Recall 3  Language- name 2  objects 2  Language- repeat 1  Language-  follow 3 step command 3  Language- read & follow direction 1  Write a sentence 1  Copy design 0  Total score 29    Cranial nerves: CN I: not tested CN II: pupils equal, round, visual fields intact CN III, IV, VI:  full range of motion, no nystagmus, no ptosis CN V: facial sensation intact CN VII: upper and lower face symmetric CN VIII: hearing intact to conversation Bulk & Tone: normal, no fasciculations. Motor: 5/5 throughout with no pronator drift. Sensation: intact to light touch, cold, pin on both UE, decreased pin on right LE, decreased cold on both LE, decreased vibration sense to knees bilaterally. Romberg test negative Deep Tendon Reflexes: +1 throughout Cerebellar: no incoordination on finger to nose negative Gait: slow and cautious, no ataxia.  Tremor: none   IMPRESSION: This is a very pleasant 78 year old right-handed man with a history of hypertension, hyperlipidemia, DM2, prostate cancer, PMR, anxiety, presenting for evaluation of recurrent syncope. The first episode was preceded by 'swirly vision,' second episode also with some blurred vision, then most recently there was no prior warning symptoms. He has been described to slump down with no convulsive activity, followed by prolonged post-event confusion and diffuse weakness. They also report episodes of 'freezing' or staring. 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 MMSE 29/30. Etiology of symptoms unclear, seizure versus syncope. From a neurological standpoint, MRI brain with and without contrast and 1-hour EEG will be ordered. If normal, we will do a 72-hour ambulatory EEG for further classification. He was also advised to contact his cardiologist, consider cardiac monitor. [place] driving laws were discussed with the patient, and he knows to stop driving after an episode of loss of consciousness until 6 months event-free. Continue to monitor memory, we may consider Neurocognitive testing in  the future. Follow-up after tests, call for any changes.    Thank you for allowing me to participate in the care of this patient. Please do not hesitate to call for any questions or concerns.   Darice Shivers, M.D.  CC: Dr. Nichole, Dr. Verlin     [1]  Current Outpatient Medications on File Prior to Visit  Medication Sig Dispense Refill   amLODipine (NORVASC) 5 MG tablet Take 5 mg by mouth daily.     aspirin  EC 81 MG tablet Take 81 mg by mouth daily.     carvedilol  (COREG ) 6.25 MG tablet Take 6.25 mg by mouth 2 (two) times daily with a meal.      Coenzyme Q10 200 MG capsule Take 200 mg by mouth daily.     DROPLET PEN NEEDLES 32G X 4 MM MISC daily.     escitalopram (LEXAPRO) 10 MG tablet Take 10 mg by mouth daily.     fish oil-omega-3 fatty acids 1000 MG capsule Take 1 g by mouth daily.      gabapentin (NEURONTIN) 300 MG capsule Take 300 mg by mouth 3 (three) times daily.     glimepiride (AMARYL) 4 MG tablet Take 4 mg by mouth 2 (two) times daily.      glucose blood (ONETOUCH VERIO) test strip Use as instructed 100 each 12   HUMALOG MIX 75/25 KWIKPEN (75-25) 100 UNIT/ML KwikPen inject 20 units with breakfast and supper Subcutaneous twice a day; Duration: 30 days     isosorbide  mononitrate (IMDUR ) 60 MG 24 hr tablet TAKE ONE-HALF TABLET BY MOUTH EVERY DAY. 90 tablet 1   Lancets (  ONETOUCH ULTRASOFT) lancets Use as instructed 100 each 12   Multiple Vitamins-Minerals (MULTIVITAMIN WITH MINERALS) tablet Take 1 tablet by mouth daily.     naproxen sodium (ANAPROX) 220 MG tablet Take 440 mg by mouth 2 (two) times daily as needed (back pain).     oxyCODONE  (OXY IR/ROXICODONE ) 5 MG immediate release tablet Take 5 mg by mouth every 6 (six) hours as needed for severe pain (pain score 7-10).     rosuvastatin  (CRESTOR ) 20 MG tablet TAKE 1 TABLET(20 MG) BY MOUTH DAILY 90 tablet 2   Empagliflozin-metFORMIN HCl (SYNJARDY) 12.12-998 MG TABS Take 1 tablet by mouth daily. (Patient not taking: Reported on  08/17/2024)     iron  polysaccharides (NU-IRON ) 150 MG capsule Take 1 capsule (150 mg total) by mouth daily. 30 capsule 0   nitroGLYCERIN  (NITROSTAT ) 0.4 MG SL tablet Place 1 tablet (0.4 mg total) under the tongue every 5 (five) minutes as needed for chest pain. (Patient not taking: Reported on 08/17/2024) 25 tablet 6   No current facility-administered medications on file prior to visit.  [2]  Allergies Allergen Reactions   Vicodin [Hydrocodone-Acetaminophen ] Other (See Comments)    dizzy and sick feeling/Knocks him out

## 2024-08-17 NOTE — Patient Instructions (Signed)
 Good to meet you.  Schedule MRI brain with and without contrast  2. Schedule 1-hour EEG. If normal, we will do a 3-day home EEG  3. Please contact your cardiologist and let him know about the passing out episodes as well  4. It is prudent to recommend that all persons should be free of syncopal (passing out) episodes for at least six months to be granted the driving privilege. (THE [place]  PHYSICIAN'S GUIDE TO DRIVER MEDICAL EVALUATION, Second Edition, Medical Review Branch, Associate Professor, Division of [employer], [place]  Department of Transportation, July 2004)   5. Follow-up after tests, call for any changes

## 2024-08-29 ENCOUNTER — Ambulatory Visit: Payer: Self-pay | Admitting: Neurology

## 2024-08-29 DIAGNOSIS — R404 Transient alteration of awareness: Secondary | ICD-10-CM

## 2024-08-29 DIAGNOSIS — R55 Syncope and collapse: Secondary | ICD-10-CM | POA: Diagnosis not present

## 2024-08-30 NOTE — Progress Notes (Signed)
 EEG complete and ready for review.

## 2024-08-30 NOTE — Procedures (Addendum)
 ELECTROENCEPHALOGRAM REPORT  Date of Study: 08/29/2024  Patient's Name: Seth Guzman MRN: [id number] Date of Birth: [birth year]  Referring Provider: Dr. Darice Shivers  Clinical History: This is an 78 year old man with recurrent syncope. EEG for classification.  CNS Active Medications: Gabapentin, Lexapro  Technical Summary: A multichannel digital 1-hour EEG recording measured by the international 10-20 system with electrodes applied with paste and impedances below 5000 ohms performed in our laboratory with EKG monitoring in an awake and asleep patient.  Hyperventilation was not performed. Photic stimulation was performed.  The digital EEG was referentially recorded, reformatted, and digitally filtered in a variety of bipolar and referential montages for optimal display.    Description: The patient is awake and drowsy during the recording.  During maximal wakefulness, there is an 8-9 Hz posterior dominant rhythm that attenuates with eye opening. The record is symmetric.  During drowsiness and sleep, there is an increase in theta slowing of the background. Vertex waves and symmetric sleep spindles were seen. Photic stimulation did not elicit any abnormalities.  There were no epileptiform discharges or electrographic seizures seen.    EKG lead was unremarkable.  Impression: This 1-hour awake and asleep EEG is normal.    Clinical Correlation: A normal EEG does not exclude a clinical diagnosis of epilepsy.  If further clinical questions remain, prolonged EEG may be helpful.  Clinical correlation is advised.   Darice Shivers, M.D.

## 2024-09-21 ENCOUNTER — Ambulatory Visit
Admission: RE | Admit: 2024-09-21 | Discharge: 2024-09-21 | Disposition: A | Discharge status: Home / Self Care | Source: Ambulatory Visit | Attending: Neurology | Admitting: Neurology

## 2024-09-21 DIAGNOSIS — R55 Syncope and collapse: Secondary | ICD-10-CM

## 2024-09-21 DIAGNOSIS — R404 Transient alteration of awareness: Secondary | ICD-10-CM

## 2024-09-21 MED ORDER — GADOPICLENOL 0.5 MMOL/ML IV SOLN
8.0000 mL | Freq: Once | INTRAVENOUS | Status: AC | PRN
  Administered 2024-09-21: 8 mL via INTRAVENOUS

## 2024-10-24 ENCOUNTER — Ambulatory Visit: Payer: Self-pay | Admitting: Neurology
